# Patient Record
Sex: Female | Born: 1985
Health system: Southern US, Community
[De-identification: ages and names within clinical notes are randomized; demographics above are authoritative.]

## PROBLEM LIST (undated history)

## (undated) DIAGNOSIS — M069 Rheumatoid arthritis, unspecified: Secondary | ICD-10-CM

## (undated) DIAGNOSIS — M21611 Bunion of right foot: Secondary | ICD-10-CM

## (undated) DIAGNOSIS — A749 Chlamydial infection, unspecified: Secondary | ICD-10-CM

## (undated) DIAGNOSIS — I7300 Raynaud's syndrome without gangrene: Secondary | ICD-10-CM

## (undated) DIAGNOSIS — Z803 Family history of malignant neoplasm of breast: Secondary | ICD-10-CM

## (undated) DIAGNOSIS — Z973 Presence of spectacles and contact lenses: Secondary | ICD-10-CM

## (undated) DIAGNOSIS — L309 Dermatitis, unspecified: Secondary | ICD-10-CM

## (undated) DIAGNOSIS — D279 Benign neoplasm of unspecified ovary: Secondary | ICD-10-CM

## (undated) DIAGNOSIS — I10 Essential (primary) hypertension: Secondary | ICD-10-CM

## (undated) DIAGNOSIS — E559 Vitamin D deficiency, unspecified: Secondary | ICD-10-CM

## (undated) HISTORY — PX: LAPAROSCOPY: SHX197

## (undated) HISTORY — DX: Chlamydial infection, unspecified: A74.9

## (undated) HISTORY — DX: Rheumatoid arthritis, unspecified: M06.9

## (undated) HISTORY — DX: Family history of malignant neoplasm of breast: Z80.3

---

## 2005-09-30 ENCOUNTER — Emergency Department: Payer: Self-pay | Admitting: Emergency Medicine

## 2006-09-28 DIAGNOSIS — Z8614 Personal history of Methicillin resistant Staphylococcus aureus infection: Secondary | ICD-10-CM

## 2006-09-28 HISTORY — DX: Personal history of Methicillin resistant Staphylococcus aureus infection: Z86.14

## 2006-12-07 ENCOUNTER — Emergency Department: Payer: Self-pay | Admitting: Emergency Medicine

## 2006-12-08 ENCOUNTER — Emergency Department: Payer: Self-pay | Admitting: Emergency Medicine

## 2007-10-21 ENCOUNTER — Ambulatory Visit: Payer: Self-pay | Admitting: Internal Medicine

## 2011-09-17 ENCOUNTER — Inpatient Hospital Stay: Payer: Self-pay | Admitting: Obstetrics and Gynecology

## 2011-09-23 LAB — PATHOLOGY REPORT

## 2011-09-29 DIAGNOSIS — D279 Benign neoplasm of unspecified ovary: Secondary | ICD-10-CM

## 2011-09-29 HISTORY — DX: Benign neoplasm of unspecified ovary: D27.9

## 2011-09-29 HISTORY — PX: LAPAROSCOPIC SALPINGOOPHERECTOMY: SUR795

## 2012-08-28 HISTORY — PX: OVARIAN CYST REMOVAL: SHX89

## 2015-01-20 NOTE — Op Note (Signed)
PATIENT NAME:  Meghan Arnold, Meghan Arnold MR#:  [id number] DATE OF BIRTH:  1986/09/27  DATE OF PROCEDURE:  09/17/2011  PREOPERATIVE DIAGNOSIS: Large left ovarian cyst, suspect dermoid.   POSTOPERATIVE DIAGNOSIS: Large left ovarian cyst, suspect dermoid.  PROCEDURES:  1. Diagnostic laparoscopy.  2. Conversion to open laparotomy.  3. Lysis of adhesions.  4. Left salpingo-oophorectomy.  5. Removal of left ovarian cyst.   SURGEON: Prentice Docker, MD   ASSISTANT: Verlene Mayer, MD     ANESTHESIA: General.  ESTIMATED BLOOD LOSS: 100 mL.   OPERATIVE FLUIDS: 1300 mL crystalloid.   COMPLICATIONS: None.  URINE OUTPUT: 200 mL clear urine at the end of the procedure.   FINDINGS:  1. Large left ovarian cyst (approximately 16 cm) with contents suggestive of dermoid.  2. Adhesions of left ovarian cyst to the left pelvic sidewall, left fallopian tube, posterior uterus, right ovary and fallopian tube, and densely adherent to posterior vaginal wall/cervix.   CONDITION: AT THE END OF PROCEDURE: Stable.   INDICATIONS: Meghan Arnold is a 29 year old female who presented to my office for routine annual exam, and on examination it was noticed that she had an enlarged pelvic mass. An ultrasound was obtained, and the findings were suggestive of a left ovarian cyst with features consistent with a mature cystic teratoma. After discussion with the patient, she was taken to the OR for an attempted laparoscopic removal with the understanding that there is a possibility that it might need to be converted to an open procedure given the size of the cyst.   PROCEDURE IN DETAIL: The patient was met in the preoperative area, and consents were reviewed and signed. She was taken to the operating room where she was placed under general anesthesia, which was found to be adequate. She was placed in the dorsal supine lithotomy position in the dolphin stirrups and prepped and draped in the usual sterile fashion. A Foley catheter  was placed at the beginning of the procedure after a timeout was called. Next, a speculum was placed in the vagina, and the anterior lip of the cervix was grasped with a single-tooth tenaculum, and an acorn uterine manipulator was affixed to the single-tooth tenaculum. The speculum was then removed.   Attention was then turned to the abdomen where abdominal entry was gained via an open entry technique, and a Hassan trocar was placed without difficulty in an infraumbilical location; and two 2-0 Vicryl stay sutures were thrown on the lateral aspects of the fascia to maintain the [facility] trocar in place. Abdominal entry was again verified with opening pressure being less than 10. Inspection of the abdomen was undertaken at this point with the above-noted findings. A 5 mm left lower quadrant and 10 mm right lower quadrant port were both placed under direct, intra-abdominal video visualization after injection of local anesthetic. Exploration of the mass revealed that it was densely adherent to the left sidewall. This was, at this point, all that was able to be visualized. The 5 mm LigaSure was used to interrupt the infundibulopelvic ligament on the left and follow the attachment of the mass to the left sidewall around, taking care not to come in contact with the left pelvic sidewall and hugging the mass. Of note, the ureter was identified prior to this portion of the procedure and was found to be well away from the field of operation. As the mass was freed up more from the left side, visualization was gained anteriorly where it was noted that the uterus was adherent  to the mass. Posteriorly, we were able to inspect and again we saw adhesions of the mass apparently to the posterior uterus inferiorly as well as the right ovary and right fallopian tube. Given the adhesions and delicate structures involved at this point, the procedure was terminated from a laparoscopic approach and then converted to a laparotomy. Prior to  performing the laparotomy, the trocars were removed and the fascia was closed using #2 Vicryl in a running fashion.   A Pfannenstiel incision was made to gain entrance into the abdomen, and it was carried down through the various layers, and the abdominal cavity was entered without difficulty. To gain adequate space, the rectus muscles were divided for a short length medially approximately 1 cm on each side. The cyst at this point was so massive that it would not fit through a regular Pfannenstiel size incision. The decision had to be made whether to make a very large extension to the incision or, given the sonographic findings, to attempt drainage of the mass so that space could be gained to finish the lysing of the adhesions of the mass to the uterus and the rest of the pelvic structures. At this point, we decided to carefully drain the mass. A 60 mL syringe with a large-bore needle was introduced and there was return of a yellowish fluid; and also noted were multiple pieces of hair.  Given the fact that this was very consistent with a mature cystic teratoma, a larger hole was made in the surface to be able to more effectively drain as much as possible from the cyst; therefore, two Allis clamps were placed approximately 2.5 cm apart and the scalpel used to make an incision in the cyst after placement of several moist blue towels that were packed around the mass to minimize any spillage of the mass contents into the abdomen. The suction cannula was then placed into the cyst, and the cyst was drained of approximately 250 to 300 mL of yellow/likely sebaceous material. At this point, the incision into the mass was closed using a running locked stitch of #0 Vicryl. With this, visualization of the posterior uterus and other pelvic structures was vastly improved; and both blunt and sharp dissection was undertaken to free the mass from the rest of the pelvic structures. As of note, the mass was densely adherent to the  inferior posterior uterus and needed to be dissected using electrocautery. The right ovary and right fallopian tube were also densely adherent to the cyst and were dissected very carefully using Metzenbaum scissors with care to attempt not to injure the fallopian tube. Once the mass was liberated, it was removed from the abdomen. The abdomen was then irrigated copiously using approximately 4 liters of crystalloid. At this point, all vascular pedicles and sites of adhesions were inspected and found to be hemostatic. There was a small cyst noted on her right ovary. Given the findings on ultrasound of potentially a simple cyst, and the fact that this was her only remaining ovary at this point, the cyst was not drained nor removed. At this point all instrumentation was removed from the abdomen, including laparotomy sponges and retractors; and the fascia was closed with Maxon without difficulty. The skin was of the Pfannenstiel incision was closed using a 4-0 Vicryl undyed in subcuticular fashion. The three laparoscopic port sites were then covered using Dermabond.   Attention was turned to the perineal area once again where a speculum was placed in the vagina, and the [facility]  as well as the single-tooth tenaculum were removed; and hemostasis at the cervix was noted, and the speculum was removed. The indwelling catheter was left in place.    The patient tolerated the procedure well. Sponge, lap, and needle counts were correct x2.  The patient was awakened in the OR and taken to the recovery area in stable condition.  ____________________________ Will Bonnet, MD sdj:cbb D: 09/17/2011 16:55:25 ET T: 09/17/2011 17:38:59 ET JOB#: [id number]  cc: Will Bonnet, MD, <Dictator> Will Bonnet MD ELECTRONICALLY SIGNED 10/13/2011 8:55

## 2015-09-29 DIAGNOSIS — M069 Rheumatoid arthritis, unspecified: Secondary | ICD-10-CM

## 2015-09-29 HISTORY — DX: Rheumatoid arthritis, unspecified: M06.9

## 2016-08-11 ENCOUNTER — Emergency Department: Payer: Self-pay

## 2016-08-11 ENCOUNTER — Emergency Department
Admission: EM | Admit: 2016-08-11 | Discharge: 2016-08-11 | Disposition: A | Discharge status: Home / Self Care | Payer: Self-pay | Attending: Emergency Medicine | Admitting: Emergency Medicine

## 2016-08-11 ENCOUNTER — Encounter: Payer: Self-pay | Admitting: *Deleted

## 2016-08-11 DIAGNOSIS — T07XXXA Unspecified multiple injuries, initial encounter: Secondary | ICD-10-CM

## 2016-08-11 DIAGNOSIS — M79642 Pain in left hand: Secondary | ICD-10-CM

## 2016-08-11 DIAGNOSIS — Z23 Encounter for immunization: Secondary | ICD-10-CM | POA: Insufficient documentation

## 2016-08-11 DIAGNOSIS — M79641 Pain in right hand: Secondary | ICD-10-CM

## 2016-08-11 DIAGNOSIS — S60312A Abrasion of left thumb, initial encounter: Secondary | ICD-10-CM | POA: Insufficient documentation

## 2016-08-11 DIAGNOSIS — Y939 Activity, unspecified: Secondary | ICD-10-CM | POA: Insufficient documentation

## 2016-08-11 DIAGNOSIS — Y999 Unspecified external cause status: Secondary | ICD-10-CM | POA: Insufficient documentation

## 2016-08-11 DIAGNOSIS — Y92410 Unspecified street and highway as the place of occurrence of the external cause: Secondary | ICD-10-CM | POA: Insufficient documentation

## 2016-08-11 DIAGNOSIS — S60311A Abrasion of right thumb, initial encounter: Secondary | ICD-10-CM | POA: Insufficient documentation

## 2016-08-11 LAB — POCT PREGNANCY, URINE: Preg Test, Ur: NEGATIVE

## 2016-08-11 MED ORDER — TETANUS-DIPHTH-ACELL PERTUSSIS 5-2.5-18.5 LF-MCG/0.5 IM SUSP
0.5000 mL | Freq: Once | INTRAMUSCULAR | Status: AC
  Administered 2016-08-11: 0.5 mL via INTRAMUSCULAR
  Filled 2016-08-11: qty 0.5

## 2016-08-11 MED ORDER — IBUPROFEN 600 MG PO TABS: 600.0000 mg | ORAL_TABLET | Freq: Three times a day (TID) | ORAL | 0 refills | Status: DC | PRN
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
## 2016-08-11 NOTE — Discharge Instructions (Signed)
Ice and elevate hands as needed for swelling or pain today. Begin using moisturizing hand lotion or a very thin layer of Neosporin to your abrasions. Ibuprofen 600 mg 3 times a day with food. Follow-up with your primary care doctor or [facility] clinic if any continued problems.

## 2016-08-22 ENCOUNTER — Encounter: Payer: Self-pay | Admitting: Emergency Medicine

## 2016-08-22 ENCOUNTER — Emergency Department
Admission: EM | Admit: 2016-08-22 | Discharge: 2016-08-22 | Disposition: A | Discharge status: Home / Self Care | Payer: No Typology Code available for payment source | Attending: Student in an Organized Health Care Education/Training Program | Admitting: Student in an Organized Health Care Education/Training Program

## 2016-08-22 DIAGNOSIS — F17210 Nicotine dependence, cigarettes, uncomplicated: Secondary | ICD-10-CM | POA: Insufficient documentation

## 2016-08-22 DIAGNOSIS — Z791 Long term (current) use of non-steroidal anti-inflammatories (NSAID): Secondary | ICD-10-CM | POA: Insufficient documentation

## 2016-08-22 DIAGNOSIS — M7989 Other specified soft tissue disorders: Secondary | ICD-10-CM | POA: Diagnosis present

## 2016-08-22 DIAGNOSIS — A46 Erysipelas: Secondary | ICD-10-CM | POA: Insufficient documentation

## 2016-08-22 DIAGNOSIS — L089 Local infection of the skin and subcutaneous tissue, unspecified: Secondary | ICD-10-CM

## 2016-08-22 DIAGNOSIS — B9689 Other specified bacterial agents as the cause of diseases classified elsewhere: Secondary | ICD-10-CM

## 2016-08-22 MED ORDER — TRAMADOL HCL 50 MG PO TABS: 50.0000 mg | ORAL_TABLET | Freq: Four times a day (QID) | ORAL | 0 refills | Status: AC | PRN

## 2016-08-22 MED ORDER — SULFAMETHOXAZOLE-TRIMETHOPRIM 800-160 MG PO TABS: 1.0000 | ORAL_TABLET | Freq: Two times a day (BID) | ORAL | 0 refills | Status: DC

## 2016-08-22 MED ORDER — CEPHALEXIN 500 MG PO CAPS: 500.0000 mg | ORAL_CAPSULE | Freq: Four times a day (QID) | ORAL | 0 refills | Status: DC
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
## 2016-11-17 DIAGNOSIS — R700 Elevated erythrocyte sedimentation rate: Secondary | ICD-10-CM | POA: Insufficient documentation

## 2016-11-17 DIAGNOSIS — I7300 Raynaud's syndrome without gangrene: Secondary | ICD-10-CM | POA: Insufficient documentation

## 2016-11-17 DIAGNOSIS — R7982 Elevated C-reactive protein (CRP): Secondary | ICD-10-CM | POA: Insufficient documentation

## 2016-11-17 DIAGNOSIS — M059 Rheumatoid arthritis with rheumatoid factor, unspecified: Secondary | ICD-10-CM | POA: Insufficient documentation

## 2016-11-17 DIAGNOSIS — M2550 Pain in unspecified joint: Secondary | ICD-10-CM | POA: Insufficient documentation

## 2016-12-10 ENCOUNTER — Other Ambulatory Visit: Payer: Self-pay | Admitting: Orthopedic Surgery

## 2016-12-10 DIAGNOSIS — S8392XD Sprain of unspecified site of left knee, subsequent encounter: Secondary | ICD-10-CM

## 2016-12-10 DIAGNOSIS — M2352 Chronic instability of knee, left knee: Secondary | ICD-10-CM

## 2016-12-10 DIAGNOSIS — M2392 Unspecified internal derangement of left knee: Secondary | ICD-10-CM

## 2017-01-02 ENCOUNTER — Ambulatory Visit
Admission: RE | Admit: 2017-01-02 | Discharge: 2017-01-02 | Disposition: A | Discharge status: Home / Self Care | Payer: BLUE CROSS/BLUE SHIELD | Source: Ambulatory Visit | Attending: Orthopedic Surgery | Admitting: Orthopedic Surgery

## 2017-01-02 DIAGNOSIS — M25462 Effusion, left knee: Secondary | ICD-10-CM | POA: Insufficient documentation

## 2017-01-02 DIAGNOSIS — M7122 Synovial cyst of popliteal space [Baker], left knee: Secondary | ICD-10-CM | POA: Diagnosis not present

## 2017-01-02 DIAGNOSIS — X58XXXD Exposure to other specified factors, subsequent encounter: Secondary | ICD-10-CM | POA: Diagnosis not present

## 2017-01-02 DIAGNOSIS — M2352 Chronic instability of knee, left knee: Secondary | ICD-10-CM | POA: Diagnosis present

## 2017-01-02 DIAGNOSIS — S8392XD Sprain of unspecified site of left knee, subsequent encounter: Secondary | ICD-10-CM

## 2017-01-02 DIAGNOSIS — M659 Synovitis and tenosynovitis, unspecified: Secondary | ICD-10-CM | POA: Diagnosis not present

## 2017-01-02 DIAGNOSIS — M2392 Unspecified internal derangement of left knee: Secondary | ICD-10-CM | POA: Diagnosis present

## 2017-04-19 ENCOUNTER — Ambulatory Visit (INDEPENDENT_AMBULATORY_CARE_PROVIDER_SITE_OTHER): Payer: BLUE CROSS/BLUE SHIELD | Admitting: Obstetrics and Gynecology

## 2017-04-19 ENCOUNTER — Encounter: Payer: Self-pay | Admitting: Obstetrics and Gynecology

## 2017-04-19 VITALS — BP 114/74 | Ht 65.0 in | Wt 160.0 lb

## 2017-04-19 DIAGNOSIS — Z124 Encounter for screening for malignant neoplasm of cervix: Secondary | ICD-10-CM

## 2017-04-19 DIAGNOSIS — B373 Candidiasis of vulva and vagina: Secondary | ICD-10-CM | POA: Diagnosis not present

## 2017-04-19 DIAGNOSIS — Z1389 Encounter for screening for other disorder: Secondary | ICD-10-CM | POA: Diagnosis not present

## 2017-04-19 DIAGNOSIS — Z01419 Encounter for gynecological examination (general) (routine) without abnormal findings: Secondary | ICD-10-CM | POA: Diagnosis not present

## 2017-04-19 DIAGNOSIS — Z1331 Encounter for screening for depression: Secondary | ICD-10-CM

## 2017-04-19 DIAGNOSIS — Z113 Encounter for screening for infections with a predominantly sexual mode of transmission: Secondary | ICD-10-CM | POA: Diagnosis not present

## 2017-04-19 DIAGNOSIS — Z1339 Encounter for screening examination for other mental health and behavioral disorders: Secondary | ICD-10-CM

## 2017-04-19 DIAGNOSIS — B3731 Acute candidiasis of vulva and vagina: Secondary | ICD-10-CM

## 2017-04-19 LAB — POCT WET PREP WITH KOH
KOH Prep POC: POSITIVE — AB
PH, VAGINAL: 4.5
Trichomonas, UA: NEGATIVE

## 2017-04-19 MED ORDER — FLUCONAZOLE 150 MG PO TABS: 150.0000 mg | ORAL_TABLET | Freq: Once | ORAL | 0 refills | Status: AC

## 2017-04-19 NOTE — Progress Notes (Signed)
Gynecology Annual Exam  PCP: Patient, No Pcp Per  Chief Complaint  Patient presents with  . Annual Exam    History of Present Illness:  Meghan Arnold is a 31 y.o. G0P0000 who LMP was Patient's last menstrual period was 04/05/2017., presents today for her annual examination.  Her menses are regular every 28-30 days, lasting 5 day(s).  Dysmenorrhea none. She does not have intermenstrual bleeding.  She is single partner, contraception - none.  Last Pap: 6 years, normal Hx of STDs: chlamydia, distant- treated  Last mammogram: n/a There is no FH of breast cancer. There is no FH of ovarian cancer. The patient does not do self-breast exams.  Tobacco use: The patient denies current or previous tobacco use. Alcohol use: social drinker Exercise: moderately active  The patient wears seatbelts: yes.      Past Medical History:  Diagnosis Date  . Chlamydia   . Rheumatoid arthritis [facility])     Past Surgical History:  Procedure Laterality Date  . LAPAROSCOPIC SALPINGOOPHERECTOMY    . LAPAROSCOPY    . OVARIAN CYST REMOVAL      Prior to Admission medications   Medication Sig Start Date End Date Taking? Authorizing Provider  folic acid (FOLVITE) 1 MG tablet Take 1 mg by mouth daily.   Yes [provider]  methotrexate (RHEUMATREX) 2.5 MG tablet Take 2.5 mg by mouth once a week. Caution:Chemotherapy. Protect from light.   Yes [provider]   Allergie: No Known Allergies  Gynecologic History: Patient's last menstrual period was 04/05/2017.  Obstetric History: G0P0000  Social History   Social History  . Marital status: Single    Spouse name: N/A  . Number of children: N/A  . Years of education: N/A   Occupational History  . Not on file.   Social History Main Topics  . Smoking status: Former Smoker    Packs/day: 0.10    Types: Cigarettes  . Smokeless tobacco: Never Used  . Alcohol use Yes  . Drug use: No  . Sexual activity: Yes    Birth control/  protection: None   Other Topics Concern  . Not on file   Social History Narrative  . No narrative on file    Family History  Problem Relation Age of Onset  . Colon cancer Maternal Grandfather     Review of Systems  Constitutional: Negative.   HENT: Negative.   Eyes: Negative.   Respiratory: Negative.   Cardiovascular: Negative.   Gastrointestinal: Negative.   Genitourinary: Negative.   Musculoskeletal: Negative.   Skin: Negative.   Neurological: Negative.   Psychiatric/Behavioral: Negative.      Physical Exam BP 114/74   Ht 5\' 5"  (1.651 m)   Wt 160 lb (72.6 kg)   LMP 04/05/2017   BMI 26.63 kg/m    Physical Exam  Constitutional: She is oriented to person, place, and time. She appears well-developed and well-nourished. No distress.  Eyes: EOM are normal. No scleral icterus.  Neck: Normal range of motion. Neck supple.  Cardiovascular: Normal rate and regular rhythm.   Pulmonary/Chest: Effort normal and breath sounds normal. No respiratory distress. She has no wheezes. She has no rales.  Abdominal: Soft. Bowel sounds are normal. She exhibits no distension and no mass. There is no tenderness. There is no rebound and no guarding.  Musculoskeletal: Normal range of motion. She exhibits no edema.  Neurological: She is alert and oriented to person, place, and time. No cranial nerve deficit.  Skin: Skin is  warm and dry. No erythema.  Psychiatric: She has a normal mood and affect. Her behavior is normal. Judgment normal.   Female chaperone present for pelvic and breast  portions of the physical exam  Results: AUDIT Questionnaire (screen for alcoholism): 3 PHQ-9: 1  Wet Prep: Ph: 4.5 Clue cells: present Trichomonas: absent Fungal elements: present   Assessment: 31 y.o. G0P0000 female here for routine annual gynecologic examination.  Plan: Problem List Items Addressed This Visit    None    Visit Diagnoses    Women's annual routine gynecological examination    -   Primary   Relevant Orders   IGP,CtNg,AptimaHPV,rfx16/18,45   Screening for depression       Screening for alcohol problem       Pap smear for cervical cancer screening       Relevant Orders   IGP,CtNg,AptimaHPV,rfx16/18,45   Screen for STD (sexually transmitted disease)       Relevant Orders   IGP,CtNg,AptimaHPV,rfx16/18,45   Vulvovaginal candidiasis       Relevant Medications   fluconazole (DIFLUCAN) 150 MG tablet      Screening: -- Blood pressure screen normal -- Colonoscopy - not due -- Mammogram - not due -- Weight screening: overweight: continue to monitor -- Depression screening negative (PHQ-9) -- Nutrition: normal -- cholesterol screening: not due for screening -- osteoporosis screening: not due -- tobacco screening: not using -- alcohol screening: AUDIT questionnaire indicates low-risk usage. -- family history of breast cancer screening: done. not at high risk. -- no evidence of domestic violence or intimate partner violence. -- STD screening: gonorrhea/chlamydia NAAT collected -- pap smear collected per ASCCP guidelines - - HPV vaccination series: not eligilbe  Prepregnancy planning:  Unable to get pregnant x 6 years of "not preventing." taking methotrexate since January for rheumatoid arthritis.  Discussed stopping MTX and choosing alternative.  Likely need for HSG given history.    Prentice Docker, MD 04/19/2017 10:47 AM

## 2017-04-19 NOTE — Patient Instructions (Signed)
Hysterosalpingogram (HSG) What is a hysterosalpingogram (HSG)?  A hysterosalpingogram or HSG is an x-ray procedure used to see whether the fallopian tubes are patent (open) and if the inside of the uterus (uterine cavity) is normal. HSG is an outpatient procedure that usually takes less than 5 minutes to perform. It is usually done after the menstrual period ends but before ovulation. How is a hysterosalpingogram done?  A woman is positioned under a fluoroscope (a x-ray imager that can take pictures during the study) on a table. The gynecologist or radiologist then examines the patient's uterus and places a speculum in her vagina. Her cervix is cleaned, and a device (cannula) is placed into the opening of the cervix. The doctor gently fills the uterus with a liquid containing iodine (a fluid that can be seen by x-ray) through the cannula. The contrast will be seen as white on the image and can show the contour of the uterus as the liquid travels from the cannula, into the uterus, and through the fallopian tubes. As the contrast enters the tubes, it outlines the length of the tubes and spills out their ends if they are open. Abnormalities inside the uterine cavity may also be detected by the doctor observing the x-ray images when the fluid movement is disrupted by the abnormality. The HSG procedure is not designed to evaluate the ovaries or to diagnose endometriosis, nor can it identify fibroids that are outside of the endometrial cavity, either in the muscular part of the uterus, or on the outside of the uterus. Often, side views of the uterus and tubes are obtained by having the woman change her position on the table. After the HSG, a woman can immediately return to normal activities, although some doctors ask that she refrain from intercourse for a few days.  Is it uncomfortable?  An HSG usually causes mild or moderate uterine cramping for about 5-10 minutes. However, some women may experience cramps for  several hours. These symptoms can be greatly reduced by taking medications used for menstrual cramps before the procedure or when they occur. Women should be prepared to have a family member or friend drive them home after the procedure in the event that they are experiencing cramping.  Does a hysterosalpingogram enhance fertility?  It is controversial whether this procedure enhances fertility. Some studies show a slight increase in fertility lasting about 3 months after a normal HSG. However, most doctors perform HSG only for diagnostic reasons. What are the risks and complications of HSG?  HSG is considered a very safe procedure. However, there is a set of recognized complications, some serious, which occur less than 1% of the time. . Infection - The most common serious problem with HSG is pelvic infection. This usually occurs when a woman has had previous tubal disease (such as a past infection of chlamydia). In rare cases, infection can damage the fallopian tubes or make it necessary to remove them. A woman should call her doctor if she experiences increasing pain or a fever within 1-2 days of the HSG. . Fainting - Rarely, the woman may get light-headed during or shortly after the procedure. . Radiation Exposure - Radiation exposure from an HSG is very low, less than with a kidney or bowel study. This exposure has not been shown to cause harm, even if a woman conceives later the same month. The HSG should not be done if pregnancy is suspected. . Iodine Allergy - Rarely, a woman may have an allergy to the iodine  contrast used in HSG. A woman should inform her doctor if she is allergic to iodine, intravenous contrast dyes, or seafood. Women who are allergic to iodine should have the HSG procedure performed without an iodine-containing contrast solution. If a woman experiences a rash, itching, or swelling after the procedure, she should contact her doctor. Marland Kitchen Spotting - Spotting sometimes occurs for 1-2  days after HSG. Unless instructed otherwise, a woman should notify her doctor if she experiences heavy bleeding after HSG.  What is the next step if my tubes are blocked?  If your tubes are blocked, your doctor will likely recommend either a surgical procedure to directly view the tubes (laparoscopy) or to bypass the tubes and perform in vitro fertilization (IVF). This is a complex decision that should be discussed with your doctor. For more information, please see the ASRM booklet Laparoscopy and hysteroscopy and fact sheet What do I need to know about conceiving after tubal surgery? Are there other options to evaluate tubal patency?  Laparoscopy can also determine if tubes are open, using a procedure called chromopertubation. An alternative procedure to evaluate tubal patency is a sonohysterosalpingogram (SHG). For [facility], a catheter (narrow tube) is placed in the uterus through the vagina and saline and air are injected. In women who have open fallopian tubes, tiny air bubbles may be seen going through the fallopian tubes during the ultrasound. However, this procedure is inferior to HSG for assessment of tubal patency. Revised 2015 For more information on this and other reproductive health topics, visit [email or web address] Patent tube, contrast  spills out Blocked tube, no contrast spills out Ovary Fallopian tube Uterus Vagina Catheter used to inject the contrast [facility] . [place], [place] [address] TEL [phone] . FAX [phone] . E-MAIL [email or web address] . URL [email or web address]

## 2017-04-22 ENCOUNTER — Encounter: Payer: Self-pay | Admitting: Obstetrics and Gynecology

## 2017-04-22 LAB — IGP,CTNG,APTIMAHPV,RFX16/18,45
Chlamydia, Nuc. Acid Amp: NEGATIVE
Gonococcus by Nucleic Acid Amp: NEGATIVE
HPV Aptima: NEGATIVE
PAP Smear Comment: 0

## 2017-04-28 ENCOUNTER — Encounter: Payer: Self-pay | Admitting: Obstetrics and Gynecology

## 2017-05-03 ENCOUNTER — Telehealth: Payer: Self-pay | Admitting: Obstetrics and Gynecology

## 2017-05-03 NOTE — Telephone Encounter (Signed)
Please schedule HSG for day 6-10 of her cycle. I do not know the first day of her LMP(must've just occurred). Perhaps you could give her a call to check when that would be.  I don't know whether I have to place an order for that or not.  Thanks!

## 2017-05-03 NOTE — Telephone Encounter (Signed)
Appointment Request From: [name]    With Provider: Prentice Docker, MD [facility]]    Preferred Date Range: From 05/02/2017 To 05/07/2017    Preferred Times: Any    Reason for visit: Office Visit    Comments:  Hello, Dr. Glennon Mac asked me to schedule an appointment as soon as my cycle started so that he could get me in for a HSG test. My cycle started today 8/5.  Thanks    [employer] to. Dr. Glennon Mac for orders.

## 2017-05-04 ENCOUNTER — Other Ambulatory Visit: Payer: Self-pay | Admitting: Obstetrics and Gynecology

## 2017-05-04 DIAGNOSIS — Z3141 Encounter for fertility testing: Secondary | ICD-10-CM

## 2017-05-04 NOTE — Telephone Encounter (Signed)
Per patient, LMP 05/02/17. Patient is scheduled for Tuesday, 05/11/17 @ 1:30pm. Patient is aware to arrive at 1pm at the [facility] registration desk, and that there are no special instructions.

## 2017-05-11 ENCOUNTER — Ambulatory Visit
Admission: RE | Admit: 2017-05-11 | Discharge: 2017-05-11 | Disposition: A | Discharge status: Home / Self Care | Payer: BLUE CROSS/BLUE SHIELD | Source: Ambulatory Visit | Attending: Obstetrics and Gynecology | Admitting: Obstetrics and Gynecology

## 2017-05-11 DIAGNOSIS — Z3141 Encounter for fertility testing: Secondary | ICD-10-CM | POA: Insufficient documentation

## 2017-05-11 MED ORDER — IOPAMIDOL (ISOVUE-370) INJECTION 76%
50.0000 mL | Freq: Once | INTRAVENOUS | Status: AC | PRN
  Administered 2017-05-11: 15 mL

## 2017-05-11 NOTE — Procedures (Signed)
Prep and drape done.  Cervix normal.  Catheter placed into uterine cavity. Approximately 9 mL dye injected under fluoroscopic visualization.  Patent right fallopian tube seen with slow filling and some spillage, possibly consistent with underlying scarring.  Catheter removed.  Pt stable, tolerated procedure well.   Isovue 370 contrast used.  Prentice Docker, MD 05/11/2017 3:08 PM

## 2017-05-27 ENCOUNTER — Encounter: Payer: Self-pay | Admitting: Obstetrics and Gynecology

## 2017-06-30 ENCOUNTER — Encounter: Payer: Self-pay | Admitting: Obstetrics and Gynecology

## 2017-06-30 ENCOUNTER — Ambulatory Visit (INDEPENDENT_AMBULATORY_CARE_PROVIDER_SITE_OTHER): Payer: BLUE CROSS/BLUE SHIELD | Admitting: Obstetrics and Gynecology

## 2017-06-30 VITALS — BP 118/74 | Wt 154.0 lb

## 2017-06-30 DIAGNOSIS — Z3169 Encounter for other general counseling and advice on procreation: Secondary | ICD-10-CM | POA: Diagnosis not present

## 2017-07-02 DIAGNOSIS — Z3169 Encounter for other general counseling and advice on procreation: Secondary | ICD-10-CM | POA: Insufficient documentation

## 2017-07-02 NOTE — Progress Notes (Signed)
Obstetrics & Gynecology Office Visit   Chief Complaint  Patient presents with  . Follow-up  Follow up results of hysterosalpingogram  History of Present Illness: 31 y.o. G0P0000 female who presents in follow up to discuss next steps of her HSG.  Briefly, this is a patient who had an incidentally noted pelvic mass noted during a routine gynecologic exam in 2012.  The mass was a large teratoma, removed surgically.  During her surgery her left fallopian tube and entire left ovary were removed.  A significant amount of adhesive disease was present at this time.  The cyst was a large and required laparotomy to be removed due to inflammation and resulting adhesions.  She had and her partner have been trying for about six years without success, to get pregnant.  An HSG was performed to assess function of her right fallopian tube.  The findings showed slow filling of the uterus and right fallopian tube.  Peritoneal spillage seen although predominantly localized adjacent to the fimbriated end of the fallopian tube, possible suggesting some underlying adhesive disease.  No left fallopian tube filling seen.   She presents today to discuss further options for achieving pregnancy.  She has previously been counseled regarding the use of methotrexate and its effects on fertility.    Past Medical History:  Diagnosis Date  . Chlamydia   . Family history of breast cancer   . Rheumatoid arthritis [facility])     Past Surgical History:  Procedure Laterality Date  . LAPAROSCOPIC SALPINGOOPHERECTOMY    . LAPAROSCOPY    . OVARIAN CYST REMOVAL      Gynecologic History: Patient's last menstrual period was 06/23/2017.  Obstetric History: G0P0000  Family History  Problem Relation Age of Onset  . Colon cancer Maternal Grandfather   . Breast cancer Maternal Grandmother 72  . Breast cancer Other 64  . Breast cancer Cousin 98    Social History   Social History  . Marital status: Single    Spouse name: N/A  .  Number of children: N/A  . Years of education: N/A   Occupational History  . Not on file.   Social History Main Topics  . Smoking status: Former Smoker    Packs/day: 0.10    Types: Cigarettes  . Smokeless tobacco: Never Used  . Alcohol use Yes  . Drug use: No  . Sexual activity: Yes    Birth control/ protection: None   Other Topics Concern  . Not on file   Social History Narrative  . No narrative on file   Allergies: No Known Allergies  Prior to Admission medications   Medication Sig Start Date End Date Taking? Authorizing Provider  folic acid (FOLVITE) 1 MG tablet Take 1 mg by mouth daily.    [provider]  methotrexate (RHEUMATREX) 2.5 MG tablet Take 2.5 mg by mouth once a week. Caution:Chemotherapy. Protect from light.    [provider]    Review of Systems  Constitutional: Negative.   HENT: Negative.   Eyes: Negative.   Respiratory: Negative.   Cardiovascular: Negative.   Gastrointestinal: Negative.   Genitourinary: Negative.   Musculoskeletal: Negative.   Skin: Negative.   Neurological: Negative.   Psychiatric/Behavioral: Negative.      Physical Exam BP 118/74   Wt 154 lb (69.9 kg)   LMP 06/23/2017   BMI 25.63 kg/m  Patient's last menstrual period was 06/23/2017. Physical Exam  Constitutional: She is oriented to person, place, and time. She appears well-developed and well-nourished. No  distress.  HENT:  Head: Normocephalic and atraumatic.  Neurological: She is alert and oriented to person, place, and time.  Psychiatric: She has a normal mood and affect. Her behavior is normal. Judgment normal.    Assessment: 31 y.o. G0P0000 female here for infertility counseling  Plan: Problem List Items Addressed This Visit    Infertility counseling - Primary   Relevant Orders   Ambulatory referral to Endocrinology    Referral to reproductive endocrinology and infertility.  Discontinue methotrexate in coordination with her rheumatologist.     32 minutes spent in face to face discussion with > 50% spent in counseling and management of her infertility, likely a result of significant pelvic adhesive disease.   Prentice Docker, MD 07/02/2017 3:30 PM
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
## 2017-11-22 ENCOUNTER — Ambulatory Visit (INDEPENDENT_AMBULATORY_CARE_PROVIDER_SITE_OTHER): Payer: Self-pay

## 2017-11-22 ENCOUNTER — Encounter: Payer: Self-pay | Admitting: Rheumatology

## 2017-11-22 ENCOUNTER — Ambulatory Visit: Payer: BLUE CROSS/BLUE SHIELD | Admitting: Rheumatology

## 2017-11-22 VITALS — BP 127/86 | HR 84 | Resp 17 | Ht 65.0 in | Wt 145.0 lb

## 2017-11-22 DIAGNOSIS — I7300 Raynaud's syndrome without gangrene: Secondary | ICD-10-CM

## 2017-11-22 DIAGNOSIS — M79671 Pain in right foot: Secondary | ICD-10-CM | POA: Diagnosis not present

## 2017-11-22 DIAGNOSIS — M79641 Pain in right hand: Secondary | ICD-10-CM | POA: Diagnosis not present

## 2017-11-22 DIAGNOSIS — R5383 Other fatigue: Secondary | ICD-10-CM | POA: Diagnosis not present

## 2017-11-22 DIAGNOSIS — M25562 Pain in left knee: Secondary | ICD-10-CM | POA: Diagnosis not present

## 2017-11-22 DIAGNOSIS — M25561 Pain in right knee: Secondary | ICD-10-CM

## 2017-11-22 DIAGNOSIS — Z79899 Other long term (current) drug therapy: Secondary | ICD-10-CM

## 2017-11-22 DIAGNOSIS — G8929 Other chronic pain: Secondary | ICD-10-CM

## 2017-11-22 DIAGNOSIS — M79642 Pain in left hand: Secondary | ICD-10-CM

## 2017-11-22 DIAGNOSIS — M79672 Pain in left foot: Secondary | ICD-10-CM | POA: Diagnosis not present

## 2017-11-22 DIAGNOSIS — M059 Rheumatoid arthritis with rheumatoid factor, unspecified: Secondary | ICD-10-CM | POA: Diagnosis not present

## 2017-11-22 NOTE — Patient Instructions (Addendum)
Certolizumab pegol injection What is this medicine? CERTOLIZUMAB (SER toe LIZ oo mab) is used to treat Crohn's disease, psoriatic arthritis, or rheumatoid arthritis. This medicine may be used for other purposes; ask your health care provider or pharmacist if you have questions. COMMON BRAND NAME(S): Cimzia What should I tell my health care provider before I take this medicine? They need to know if you have any of these conditions: -diabetes -heart disease -hepatitis B or history of hepatitis B infection -immune system problems -infection or history of infections -low blood counts, like low white cell, platelet, or red cell counts -multiple sclerosis -recently received or scheduled to receive a vaccine -scheduled to have surgery -tuberculosis, a positive skin test for tuberculosis or have recently been in close contact with someone who has tuberculosis -an unusual or allergic reaction to certolizumab, other medicines, foods, dyes, or preservatives -pregnant or trying to get pregnant -breast-feeding How should I use this medicine? This medicine is for injection under the skin. It is usually given by a health care professional in a hospital or clinic setting. If you get this medicine at home, you will be taught how to prepare and give this medicine. Use exactly as directed. Take your medicine at regular intervals. Do not take your medicine more often than directed. It is important that you put your used needles and syringes in a special sharps container. Do not put them in a trash can. If you do not have a sharps container, call your pharmacist or healthcare provider to get one. A special MedGuide will be given to you by the pharmacist with each prescription and refill. Be sure to read this information carefully each time. Talk to your pediatrician regarding the use of this medicine in children. Special care may be needed. Overdosage: If you think you have taken too much of this medicine  contact a poison control center or emergency room at once. NOTE: This medicine is only for you. Do not share this medicine with others. What if I miss a dose? It is important not to miss your dose. Call your doctor or health care professional if you are unable to keep an appointment. If you give yourself the medicine and you miss a dose, take it as soon as you can. If it is almost time for your next dose, take only that dose. Do not take double or extra doses. What may interact with this medicine? Do not take this medicine with any of the following medications: -abatacept -adalimumab -anakinra -etanercept -infliximab -live virus vaccines -rilonacept This medicine may also interact with the following medications: -vaccines This list may not describe all possible interactions. Give your health care provider a list of all the medicines, herbs, non-prescription drugs, or dietary supplements you use. Also tell them if you smoke, drink alcohol, or use illegal drugs. Some items may interact with your medicine. What should I watch for while using this medicine? Visit your doctor or health care professional for regular checks on your progress. Tell your doctor or healthcare professional if your symptoms do not start to get better or if they get worse. Your condition will be monitored carefully while you are receiving this medicine. You will be tested for tuberculosis (TB) before you start this medicine. If your doctor prescribes any medicine for TB, you should start taking the TB medicine before starting this medicine. Make sure to finish the full course of TB medicine. Call your doctor or health care professional for advice if you get a fever, chills,  sore throat, or other symptoms of an infection. Do not treat yourself. This medicine may decrease your body's ability to fight infection. Try to avoid being around people who are sick. Talk to your doctor about your risk of cancer. You may be more at risk  for certain types of cancers if you take this medicine. What side effects may I notice from receiving this medicine? Side effects that you should report to your doctor or health care professional as soon as possible: -allergic reactions like skin rash, itching or hives, swelling of the face, lips, or tongue -breathing problems -changes in vision -chest pain or palpitations -fever or chills, sore throat -pain, tingling, numbness in the hands or feet -red, scaly patches or raised bumps on the skin -seizures -swelling of the ankles, feet, hands -swollen lymph nodes in the neck, underarm, or groin areas -unexplained weight loss -unusual bleeding or bruising -unusually weak or tired Side effects that usually do not require medical attention (report to your doctor or health care professional if they continue or are bothersome): -irritation at site where injected This list may not describe all possible side effects. Call your doctor for medical advice about side effects. You may report side effects to FDA at 1-800-FDA-1088. Where should I keep my medicine? Keep out of the reach of children. If you are using this medicine at home, you will be instructed on how to store this medicine. Throw away any unused medicine after the expiration date on the label. NOTE: This sheet is a summary. It may not cover all possible information. If you have questions about this medicine, talk to your doctor, pharmacist, or health care provider.  2018 Elsevier/Gold Standard (2012-06-29 10:31:30) Hydroxychloroquine tablets What is this medicine? HYDROXYCHLOROQUINE (hye drox ee KLOR oh kwin) is used to treat rheumatoid arthritis and systemic lupus erythematosus. It is also used to treat malaria. This medicine may be used for other purposes; ask your health care provider or pharmacist if you have questions. COMMON BRAND NAME(S): Plaquenil, Quineprox What should I tell my health care provider before I take this  medicine? They need to know if you have any of these conditions: -diabetes -eye disease, vision problems -G6PD deficiency -history of blood diseases -history of irregular heartbeat -if you often drink alcohol -kidney disease -liver disease -porphyria -psoriasis -seizures -an unusual or allergic reaction to chloroquine, hydroxychloroquine, other medicines, foods, dyes, or preservatives -pregnant or trying to get pregnant -breast-feeding How should I use this medicine? Take this medicine by mouth with a glass of water. Follow the directions on the prescription label. Avoid taking antacids within 4 hours of taking this medicine. It is best to separate these medicines by at least 4 hours. Do not cut, crush or chew this medicine. You can take it with or without food. If it upsets your stomach, take it with food. Take your medicine at regular intervals. Do not take your medicine more often than directed. Take all of your medicine as directed even if you think you are better. Do not skip doses or stop your medicine early. Talk to your pediatrician regarding the use of this medicine in children. While this drug may be prescribed for selected conditions, precautions do apply. Overdosage: If you think you have taken too much of this medicine contact a poison control center or emergency room at once. NOTE: This medicine is only for you. Do not share this medicine with others. What if I miss a dose? If you miss a dose, take it as  soon as you can. If it is almost time for your next dose, take only that dose. Do not take double or extra doses. What may interact with this medicine? Do not take this medicine with any of the following medications: -cisapride -dofetilide -dronedarone -live virus vaccines -penicillamine -pimozide -thioridazine -ziprasidone This medicine may also interact with the following medications: -ampicillin -antacids -cimetidine -cyclosporine -digoxin -medicines for  diabetes, like insulin, glipizide, glyburide -medicines for seizures like carbamazepine, phenobarbital, phenytoin -mefloquine -methotrexate -other medicines that prolong the QT interval (cause an abnormal heart rhythm) -praziquantel This list may not describe all possible interactions. Give your health care provider a list of all the medicines, herbs, non-prescription drugs, or dietary supplements you use. Also tell them if you smoke, drink alcohol, or use illegal drugs. Some items may interact with your medicine. What should I watch for while using this medicine? Tell your doctor or healthcare professional if your symptoms do not start to get better or if they get worse. Avoid taking antacids within 4 hours of taking this medicine. It is best to separate these medicines by at least 4 hours. Tell your doctor or health care professional right away if you have any change in your eyesight. Your vision and blood may be tested before and during use of this medicine. This medicine can make you more sensitive to the sun. Keep out of the sun. If you cannot avoid being in the sun, wear protective clothing and use sunscreen. Do not use sun lamps or tanning beds/booths. What side effects may I notice from receiving this medicine? Side effects that you should report to your doctor or health care professional as soon as possible: -allergic reactions like skin rash, itching or hives, swelling of the face, lips, or tongue -changes in vision -decreased hearing or ringing of the ears -redness, blistering, peeling or loosening of the skin, including inside the mouth -seizures -sensitivity to light -signs and symptoms of a dangerous change in heartbeat or heart rhythm like chest pain; dizziness; fast or irregular heartbeat; palpitations; feeling faint or lightheaded, falls; breathing problems -signs and symptoms of liver injury like dark yellow or brown urine; general ill feeling or flu-like symptoms;  light-colored stools; loss of appetite; nausea; right upper belly pain; unusually weak or tired; yellowing of the eyes or skin -signs and symptoms of low blood sugar such as feeling anxious; confusion; dizziness; increased hunger; unusually weak or tired; sweating; shakiness; cold; irritable; headache; blurred vision; fast heartbeat; loss of consciousness -uncontrollable head, mouth, neck, arm, or leg movements Side effects that usually do not require medical attention (report to your doctor or health care professional if they continue or are bothersome): -anxious -diarrhea -dizziness -hair loss -headache -irritable -loss of appetite -nausea, vomiting -stomach pain This list may not describe all possible side effects. Call your doctor for medical advice about side effects. You may report side effects to FDA at 1-800-FDA-1088. Where should I keep my medicine? Keep out of the reach of children. In children, this medicine can cause overdose with small doses. Store at room temperature between 15 and 30 degrees C (59 and 86 degrees F). Protect from moisture and light. Throw away any unused medicine after the expiration date. NOTE: This sheet is a summary. It may not cover all possible information. If you have questions about this medicine, talk to your doctor, pharmacist, or health care provider.  2018 Elsevier/Gold Standard (2016-04-29 14:16:15)   Hydroxychloroquine tablets What is this medicine? HYDROXYCHLOROQUINE (hye drox ee KLOR oh kwin)  is used to treat rheumatoid arthritis and systemic lupus erythematosus. It is also used to treat malaria. This medicine may be used for other purposes; ask your health care provider or pharmacist if you have questions. COMMON BRAND NAME(S): Plaquenil, Quineprox What should I tell my health care provider before I take this medicine? They need to know if you have any of these conditions: -diabetes -eye disease, vision problems -G6PD deficiency -history  of blood diseases -history of irregular heartbeat -if you often drink alcohol -kidney disease -liver disease -porphyria -psoriasis -seizures -an unusual or allergic reaction to chloroquine, hydroxychloroquine, other medicines, foods, dyes, or preservatives -pregnant or trying to get pregnant -breast-feeding How should I use this medicine? Take this medicine by mouth with a glass of water. Follow the directions on the prescription label. Avoid taking antacids within 4 hours of taking this medicine. It is best to separate these medicines by at least 4 hours. Do not cut, crush or chew this medicine. You can take it with or without food. If it upsets your stomach, take it with food. Take your medicine at regular intervals. Do not take your medicine more often than directed. Take all of your medicine as directed even if you think you are better. Do not skip doses or stop your medicine early. Talk to your pediatrician regarding the use of this medicine in children. While this drug may be prescribed for selected conditions, precautions do apply. Overdosage: If you think you have taken too much of this medicine contact a poison control center or emergency room at once. NOTE: This medicine is only for you. Do not share this medicine with others. What if I miss a dose? If you miss a dose, take it as soon as you can. If it is almost time for your next dose, take only that dose. Do not take double or extra doses. What may interact with this medicine? Do not take this medicine with any of the following medications: -cisapride -dofetilide -dronedarone -live virus vaccines -penicillamine -pimozide -thioridazine -ziprasidone This medicine may also interact with the following medications: -ampicillin -antacids -cimetidine -cyclosporine -digoxin -medicines for diabetes, like insulin, glipizide, glyburide -medicines for seizures like carbamazepine, phenobarbital,  phenytoin -mefloquine -methotrexate -other medicines that prolong the QT interval (cause an abnormal heart rhythm) -praziquantel This list may not describe all possible interactions. Give your health care provider a list of all the medicines, herbs, non-prescription drugs, or dietary supplements you use. Also tell them if you smoke, drink alcohol, or use illegal drugs. Some items may interact with your medicine. What should I watch for while using this medicine? Tell your doctor or healthcare professional if your symptoms do not start to get better or if they get worse. Avoid taking antacids within 4 hours of taking this medicine. It is best to separate these medicines by at least 4 hours. Tell your doctor or health care professional right away if you have any change in your eyesight. Your vision and blood may be tested before and during use of this medicine. This medicine can make you more sensitive to the sun. Keep out of the sun. If you cannot avoid being in the sun, wear protective clothing and use sunscreen. Do not use sun lamps or tanning beds/booths. What side effects may I notice from receiving this medicine? Side effects that you should report to your doctor or health care professional as soon as possible: -allergic reactions like skin rash, itching or hives, swelling of the face, lips, or tongue -changes in  vision -decreased hearing or ringing of the ears -redness, blistering, peeling or loosening of the skin, including inside the mouth -seizures -sensitivity to light -signs and symptoms of a dangerous change in heartbeat or heart rhythm like chest pain; dizziness; fast or irregular heartbeat; palpitations; feeling faint or lightheaded, falls; breathing problems -signs and symptoms of liver injury like dark yellow or brown urine; general ill feeling or flu-like symptoms; light-colored stools; loss of appetite; nausea; right upper belly pain; unusually weak or tired; yellowing of the  eyes or skin -signs and symptoms of low blood sugar such as feeling anxious; confusion; dizziness; increased hunger; unusually weak or tired; sweating; shakiness; cold; irritable; headache; blurred vision; fast heartbeat; loss of consciousness -uncontrollable head, mouth, neck, arm, or leg movements Side effects that usually do not require medical attention (report to your doctor or health care professional if they continue or are bothersome): -anxious -diarrhea -dizziness -hair loss -headache -irritable -loss of appetite -nausea, vomiting -stomach pain This list may not describe all possible side effects. Call your doctor for medical advice about side effects. You may report side effects to FDA at 1-800-FDA-1088. Where should I keep my medicine? Keep out of the reach of children. In children, this medicine can cause overdose with small doses. Store at room temperature between 15 and 30 degrees C (59 and 86 degrees F). Protect from moisture and light. Throw away any unused medicine after the expiration date. NOTE: This sheet is a summary. It may not cover all possible information. If you have questions about this medicine, talk to your doctor, pharmacist, or health care provider.  2018 Elsevier/Gold Standard (2016-04-29 14:16:15)

## 2017-11-23 ENCOUNTER — Other Ambulatory Visit: Payer: Self-pay | Admitting: *Deleted

## 2017-11-23 ENCOUNTER — Telehealth: Payer: Self-pay | Admitting: *Deleted

## 2017-11-23 DIAGNOSIS — E559 Vitamin D deficiency, unspecified: Secondary | ICD-10-CM

## 2017-11-23 MED ORDER — VITAMIN D (ERGOCALCIFEROL) 1.25 MG (50000 UNIT) PO CAPS: 50000.0000 [IU] | ORAL_CAPSULE | ORAL | 0 refills | Status: DC

## 2017-11-23 NOTE — Progress Notes (Signed)
Labs are consistent with rheumatoid arthritis.  Her vitamin D is low.  Please call in vitamin D 50,000 units twice a week for 3 months.  Recheck vitamin D level in 3 months.

## 2017-11-23 NOTE — Telephone Encounter (Signed)
-----   Message from Bo Merino, MD sent at 11/23/2017  2:06 PM EST ----- Labs are consistent with rheumatoid arthritis.  Her vitamin D is low.  Please call in vitamin D 50,000 units twice a week for 3 months.  Recheck vitamin D level in 3 months.

## 2017-11-23 NOTE — Telephone Encounter (Signed)
error 

## 2017-11-24 ENCOUNTER — Telehealth: Payer: Self-pay | Admitting: Rheumatology

## 2017-11-24 LAB — RHEUMATOID FACTOR: Rhuematoid fact SerPl-aCnc: 18 IU/mL — ABNORMAL HIGH (ref <14)

## 2017-11-24 LAB — CBC WITH DIFFERENTIAL/PLATELET
Basophils Absolute: 40 cells/uL (ref 0–200)
Basophils Relative: 0.9 %
Eosinophils Absolute: 189 cells/uL (ref 15–500)
Eosinophils Relative: 4.3 %
HCT: 34.8 % — ABNORMAL LOW (ref 35.0–45.0)
Hemoglobin: 12 g/dL (ref 11.7–15.5)
Lymphs Abs: 1852 cells/uL (ref 850–3900)
MCH: 29.8 pg (ref 27.0–33.0)
MCHC: 34.5 g/dL (ref 32.0–36.0)
MCV: 86.4 fL (ref 80.0–100.0)
MPV: 9.4 fL (ref 7.5–12.5)
Monocytes Relative: 7.8 %
Neutro Abs: 1976 cells/uL (ref 1500–7800)
Neutrophils Relative %: 44.9 %
Platelets: 301 10*3/uL (ref 140–400)
RBC: 4.03 10*6/uL (ref 3.80–5.10)
RDW: 12.5 % (ref 11.0–15.0)
Total Lymphocyte: 42.1 %
WBC mixed population: 343 cells/uL (ref 200–950)
WBC: 4.4 10*3/uL (ref 3.8–10.8)

## 2017-11-24 LAB — IGG, IGA, IGM
IgG (Immunoglobin G), Serum: 1176 mg/dL (ref 694–1618)
IgM, Serum: 224 mg/dL (ref 48–271)
Immunoglobulin A: 345 mg/dL (ref 81–463)

## 2017-11-24 LAB — COMPLETE METABOLIC PANEL WITH GFR
AG Ratio: 1.6 (calc) (ref 1.0–2.5)
ALT: 9 U/L (ref 6–29)
AST: 10 U/L (ref 10–30)
Albumin: 4.2 g/dL (ref 3.6–5.1)
Alkaline phosphatase (APISO): 46 U/L (ref 33–115)
BUN: 8 mg/dL (ref 7–25)
CO2: 28 mmol/L (ref 20–32)
Calcium: 9.5 mg/dL (ref 8.6–10.2)
Chloride: 105 mmol/L (ref 98–110)
Creat: 0.65 mg/dL (ref 0.50–1.10)
GFR, Est African American: 136 mL/min/{1.73_m2} (ref >=60)
GFR, Est Non African American: 117 mL/min/{1.73_m2} (ref >=60)
Globulin: 2.6 g/dL (calc) (ref 1.9–3.7)
Glucose, Bld: 81 mg/dL (ref 65–99)
Potassium: 4.4 mmol/L (ref 3.5–5.3)
Sodium: 139 mmol/L (ref 135–146)
Total Bilirubin: 0.3 mg/dL (ref 0.2–1.2)
Total Protein: 6.8 g/dL (ref 6.1–8.1)

## 2017-11-24 LAB — ANTI-NUCLEAR AB-TITER (ANA TITER): ANA Titer 1: 1:40 {titer} — ABNORMAL HIGH

## 2017-11-24 LAB — URINALYSIS, ROUTINE W REFLEX MICROSCOPIC
Bilirubin Urine: NEGATIVE
Glucose, UA: NEGATIVE
Hgb urine dipstick: NEGATIVE
Ketones, ur: NEGATIVE
Leukocytes, UA: NEGATIVE
Nitrite: NEGATIVE
Protein, ur: NEGATIVE
Specific Gravity, Urine: 1.021 (ref 1.001–1.03)
pH: 7 (ref 5.0–8.0)

## 2017-11-24 LAB — QUANTIFERON-TB GOLD PLUS
Mitogen-NIL: >10 IU/mL
NIL: 0.1 IU/mL
QuantiFERON-TB Gold Plus: NEGATIVE
TB1-NIL: <0 IU/mL
TB2-NIL: <0 IU/mL

## 2017-11-24 LAB — PROTEIN ELECTROPHORESIS, SERUM, WITH REFLEX
Albumin ELP: 4.1 g/dL (ref 3.8–4.8)
Alpha 1: 0.3 g/dL (ref 0.2–0.3)
Alpha 2: 0.6 g/dL (ref 0.5–0.9)
Beta 2: 0.4 g/dL (ref 0.2–0.5)
Beta Globulin: 0.4 g/dL (ref 0.4–0.6)
Gamma Globulin: 1.2 g/dL (ref 0.8–1.7)
Total Protein: 7 g/dL (ref 6.1–8.1)

## 2017-11-24 LAB — SEDIMENTATION RATE: Sed Rate: 28 mm/h — ABNORMAL HIGH (ref 0–20)

## 2017-11-24 LAB — ANA: Anti Nuclear Antibody(ANA): POSITIVE — AB

## 2017-11-24 LAB — CK: Total CK: 78 U/L (ref 29–143)

## 2017-11-24 LAB — TSH: TSH: 0.64 mIU/L

## 2017-11-24 LAB — CYCLIC CITRUL PEPTIDE ANTIBODY, IGG: Cyclic Citrullin Peptide Ab: 240 UNITS — ABNORMAL HIGH

## 2017-11-24 LAB — HIV ANTIBODY (ROUTINE TESTING W REFLEX): HIV 1&2 Ab, 4th Generation: NONREACTIVE

## 2017-11-24 LAB — VITAMIN D 25 HYDROXY (VIT D DEFICIENCY, FRACTURES): Vit D, 25-Hydroxy: 10 ng/mL — ABNORMAL LOW (ref 30–100)

## 2017-11-24 NOTE — Telephone Encounter (Signed)
Patient called stating that the Vitamin D that Dr. Estanislado Pandy prescribed are gel capsules which patient states she cannot take without getting sick.  Patient is requesting a new prescription of Vitamin D that are not gel capsules be sent to [facility].

## 2017-11-24 NOTE — Telephone Encounter (Signed)
Patient may try over-the-counter vitamin D 5000 units daily for 3 months and we can check her labs in 3 months.

## 2017-11-24 NOTE — Telephone Encounter (Signed)
Patient states she can not tolerate gel capsules, is there an alternative we can prescribe?

## 2017-11-24 NOTE — Telephone Encounter (Signed)
Left message on machine for patient to call the office.

## 2017-11-29 ENCOUNTER — Other Ambulatory Visit: Payer: Self-pay

## 2017-11-29 ENCOUNTER — Encounter (HOSPITAL_BASED_OUTPATIENT_CLINIC_OR_DEPARTMENT_OTHER): Payer: Self-pay | Admitting: Emergency Medicine

## 2017-11-29 NOTE — Progress Notes (Signed)
SPOKE WITH:patient  RIDING HOME WITH: mother Levada Dy  AM MEDICATIONS: none  NPO STATUS: npo after midnight including gum mint candy  LABS:cbc, cmet urinalysis current in epic  COMMENTS/CONCERNS: advised no marijuana smoking 24 hours prior to surgery. Advised patient wear a sanitary bad for menstruation  ICE,;RN, BSN.

## 2017-12-01 ENCOUNTER — Ambulatory Visit (HOSPITAL_BASED_OUTPATIENT_CLINIC_OR_DEPARTMENT_OTHER): Payer: BLUE CROSS/BLUE SHIELD | Admitting: Anesthesiology

## 2017-12-01 ENCOUNTER — Encounter (HOSPITAL_BASED_OUTPATIENT_CLINIC_OR_DEPARTMENT_OTHER)
Admission: RE | Disposition: A | Discharge status: Home / Self Care | Payer: Self-pay | Source: Ambulatory Visit | Attending: Obstetrics and Gynecology

## 2017-12-01 ENCOUNTER — Ambulatory Visit (HOSPITAL_BASED_OUTPATIENT_CLINIC_OR_DEPARTMENT_OTHER)
Admission: RE | Admit: 2017-12-01 | Discharge: 2017-12-01 | Disposition: A | Discharge status: Home / Self Care | Payer: BLUE CROSS/BLUE SHIELD | Source: Ambulatory Visit | Attending: Obstetrics and Gynecology | Admitting: Obstetrics and Gynecology

## 2017-12-01 ENCOUNTER — Encounter (HOSPITAL_BASED_OUTPATIENT_CLINIC_OR_DEPARTMENT_OTHER): Payer: Self-pay

## 2017-12-01 DIAGNOSIS — N838 Other noninflammatory disorders of ovary, fallopian tube and broad ligament: Secondary | ICD-10-CM | POA: Diagnosis not present

## 2017-12-01 DIAGNOSIS — Z803 Family history of malignant neoplasm of breast: Secondary | ICD-10-CM | POA: Insufficient documentation

## 2017-12-01 DIAGNOSIS — N736 Female pelvic peritoneal adhesions (postinfective): Secondary | ICD-10-CM | POA: Insufficient documentation

## 2017-12-01 DIAGNOSIS — N7011 Chronic salpingitis: Secondary | ICD-10-CM | POA: Insufficient documentation

## 2017-12-01 DIAGNOSIS — Z87891 Personal history of nicotine dependence: Secondary | ICD-10-CM | POA: Diagnosis not present

## 2017-12-01 DIAGNOSIS — Z90721 Acquired absence of ovaries, unilateral: Secondary | ICD-10-CM | POA: Insufficient documentation

## 2017-12-01 DIAGNOSIS — Z9079 Acquired absence of other genital organ(s): Secondary | ICD-10-CM | POA: Diagnosis not present

## 2017-12-01 HISTORY — DX: Benign neoplasm of unspecified ovary: D27.9

## 2017-12-01 HISTORY — DX: Presence of spectacles and contact lenses: Z97.3

## 2017-12-01 HISTORY — PX: LAPAROSCOPIC UNILATERAL SALPINGECTOMY: SHX5934

## 2017-12-01 HISTORY — DX: Raynaud's syndrome without gangrene: I73.00

## 2017-12-01 LAB — POCT PREGNANCY, URINE: Preg Test, Ur: NEGATIVE

## 2017-12-01 SURGERY — SALPINGECTOMY, UNILATERAL, LAPAROSCOPIC
Anesthesia: General | Site: Abdomen | Laterality: Right

## 2017-12-01 MED ORDER — PROPOFOL 10 MG/ML IV BOLUS
INTRAVENOUS | Status: DC | PRN
  Administered 2017-12-01: 200 mg via INTRAVENOUS

## 2017-12-01 MED ORDER — KETOROLAC TROMETHAMINE 30 MG/ML IJ SOLN
INTRAMUSCULAR | Status: AC
  Filled 2017-12-01: qty 1

## 2017-12-01 MED ORDER — SODIUM CHLORIDE 0.9 % IR SOLN
Status: DC | PRN
  Administered 2017-12-01: 2000 mL

## 2017-12-01 MED ORDER — FENTANYL CITRATE (PF) 100 MCG/2ML IJ SOLN
INTRAMUSCULAR | Status: AC
  Filled 2017-12-01: qty 2

## 2017-12-01 MED ORDER — FENTANYL CITRATE (PF) 250 MCG/5ML IJ SOLN
INTRAMUSCULAR | Status: AC
  Filled 2017-12-01: qty 5

## 2017-12-01 MED ORDER — LIDOCAINE HCL (CARDIAC) 20 MG/ML IV SOLN
INTRAVENOUS | Status: DC | PRN
  Administered 2017-12-01: 50 mg via INTRAVENOUS

## 2017-12-01 MED ORDER — BUPIVACAINE HCL (PF) 0.25 % IJ SOLN
INTRAMUSCULAR | Status: DC | PRN
  Administered 2017-12-01: 13 mL

## 2017-12-01 MED ORDER — PROPOFOL 10 MG/ML IV BOLUS
INTRAVENOUS | Status: AC
  Filled 2017-12-01: qty 20

## 2017-12-01 MED ORDER — KETOROLAC TROMETHAMINE 30 MG/ML IJ SOLN
INTRAMUSCULAR | Status: DC | PRN
  Administered 2017-12-01: 30 mg via INTRAVENOUS

## 2017-12-01 MED ORDER — FENTANYL CITRATE (PF) 100 MCG/2ML IJ SOLN
25.0000 ug | INTRAMUSCULAR | Status: DC | PRN
  Administered 2017-12-01 (×2): 25 ug via INTRAVENOUS
  Filled 2017-12-01: qty 1

## 2017-12-01 MED ORDER — PROPOFOL 500 MG/50ML IV EMUL
INTRAVENOUS | Status: AC
  Filled 2017-12-01: qty 50

## 2017-12-01 MED ORDER — SUGAMMADEX SODIUM 200 MG/2ML IV SOLN
INTRAVENOUS | Status: DC | PRN
  Administered 2017-12-01: 200 mg via INTRAVENOUS

## 2017-12-01 MED ORDER — FENTANYL CITRATE (PF) 100 MCG/2ML IJ SOLN
INTRAMUSCULAR | Status: DC | PRN
  Administered 2017-12-01 (×2): 100 ug via INTRAVENOUS
  Administered 2017-12-01: 50 ug via INTRAVENOUS

## 2017-12-01 MED ORDER — LACTATED RINGERS IV SOLN
INTRAVENOUS | Status: DC
Start: 2017-12-01 — End: 2017-12-01
  Administered 2017-12-01 (×2): via INTRAVENOUS
  Filled 2017-12-01: qty 1000

## 2017-12-01 MED ORDER — MIDAZOLAM HCL 2 MG/2ML IJ SOLN
INTRAMUSCULAR | Status: AC
  Filled 2017-12-01: qty 2

## 2017-12-01 MED ORDER — METHYLENE BLUE 0.5 % INJ SOLN
INTRAVENOUS | Status: DC | PRN
  Administered 2017-12-01: 1 mL

## 2017-12-01 MED ORDER — BUPIVACAINE-EPINEPHRINE 0.25% -1:200000 IJ SOLN: INTRAMUSCULAR | Status: DC | PRN

## 2017-12-01 MED ORDER — OXYCODONE-ACETAMINOPHEN 7.5-325 MG PO TABS: 1.0000 | ORAL_TABLET | ORAL | 0 refills | Status: DC | PRN

## 2017-12-01 MED ORDER — HEMOSTATIC AGENTS (NO CHARGE) OPTIME
TOPICAL | Status: DC | PRN
  Administered 2017-12-01: 1 via TOPICAL

## 2017-12-01 MED ORDER — ROCURONIUM BROMIDE 100 MG/10ML IV SOLN
INTRAVENOUS | Status: DC | PRN
  Administered 2017-12-01: 50 mg via INTRAVENOUS

## 2017-12-01 MED ORDER — CEFAZOLIN SODIUM-DEXTROSE 2-4 GM/100ML-% IV SOLN
2.0000 g | INTRAVENOUS | Status: AC
  Administered 2017-12-01: 2 g via INTRAVENOUS
  Filled 2017-12-01: qty 100

## 2017-12-01 MED ORDER — DEXAMETHASONE SODIUM PHOSPHATE 10 MG/ML IJ SOLN
INTRAMUSCULAR | Status: DC | PRN
  Administered 2017-12-01: 5 mg via INTRAVENOUS

## 2017-12-01 MED ORDER — CEFAZOLIN SODIUM-DEXTROSE 2-4 GM/100ML-% IV SOLN
INTRAVENOUS | Status: AC
  Filled 2017-12-01: qty 100

## 2017-12-01 MED ORDER — MIDAZOLAM HCL 5 MG/5ML IJ SOLN
INTRAMUSCULAR | Status: DC | PRN
  Administered 2017-12-01: 2 mg via INTRAVENOUS

## 2017-12-01 MED ORDER — ONDANSETRON HCL 4 MG/2ML IJ SOLN
INTRAMUSCULAR | Status: DC | PRN
  Administered 2017-12-01: 4 mg via INTRAVENOUS

## 2017-12-01 MED ORDER — ONDANSETRON HCL 4 MG PO TABS: 4.0000 mg | ORAL_TABLET | Freq: Three times a day (TID) | ORAL | 0 refills | Status: DC | PRN

## 2017-12-01 SURGICAL SUPPLY — 41 items
BARRIER ADHS 3X4 INTERCEED (GAUZE/BANDAGES/DRESSINGS) ×2 IMPLANT
CABLE HIGH FREQUENCY MONO STRZ (ELECTRODE) ×2 IMPLANT
CLOTH BEACON ORANGE TIMEOUT ST (SAFETY) IMPLANT
CONT SPECI 4OZ STER CLIK (MISCELLANEOUS) ×2 IMPLANT
DERMABOND ADVANCED (GAUZE/BANDAGES/DRESSINGS) ×1
DERMABOND ADVANCED .7 DNX12 (GAUZE/BANDAGES/DRESSINGS) ×1 IMPLANT
DRSG COVADERM PLUS 2X2 (GAUZE/BANDAGES/DRESSINGS) ×2 IMPLANT
DRSG OPSITE POSTOP 3X4 (GAUZE/BANDAGES/DRESSINGS) ×2 IMPLANT
DURAPREP 26ML APPLICATOR (WOUND CARE) ×2 IMPLANT
ELECT REM PT RETURN 9FT ADLT (ELECTROSURGICAL) ×2
ELECTRODE REM PT RTRN 9FT ADLT (ELECTROSURGICAL) ×1 IMPLANT
GLOVE BIO SURGEON STRL SZ8 (GLOVE) ×4 IMPLANT
GLOVE BIOGEL PI IND STRL 7.5 (GLOVE) ×4 IMPLANT
GLOVE BIOGEL PI INDICATOR 7.5 (GLOVE) ×4
GOWN STRL REUS W/TWL LRG LVL3 (GOWN DISPOSABLE) ×8 IMPLANT
IV NS 1000ML (IV SOLUTION) ×2
IV NS 1000ML BAXH (IV SOLUTION) ×2 IMPLANT
IV NS 500ML (IV SOLUTION) ×1
IV NS 500ML BAXH (IV SOLUTION) ×1 IMPLANT
MANIPULATOR UTERINE 4.5 ZUMI (MISCELLANEOUS) ×2 IMPLANT
NEEDLE INSUFFLATION 120MM (ENDOMECHANICALS) ×2 IMPLANT
PACK LAPAROSCOPY BASIN (CUSTOM PROCEDURE TRAY) ×2 IMPLANT
PACK TRENDGUARD 450 HYBRID PRO (MISCELLANEOUS) ×1 IMPLANT
SEPRAFILM MEMBRANE 5X6 (MISCELLANEOUS) IMPLANT
SET IRRIG TUBING LAPAROSCOPIC (IRRIGATION / IRRIGATOR) IMPLANT
SET TRI-LUMEN FLTR TB AIRSEAL (TUBING) ×2 IMPLANT
SUT MNCRL AB 4-0 PS2 18 (SUTURE) ×2 IMPLANT
SYR 20CC LL (SYRINGE) ×2 IMPLANT
SYR 30ML LL (SYRINGE) ×2 IMPLANT
SYR 50ML LL SCALE MARK (SYRINGE) IMPLANT
SYR 5ML LL (SYRINGE) ×2 IMPLANT
SYR CONTROL 10ML LL (SYRINGE) IMPLANT
SYS BAG RETRIEVAL 10MM (BASKET)
SYSTEM BAG RETRIEVAL 10MM (BASKET) IMPLANT
TOWEL OR 17X24 6PK STRL BLUE (TOWEL DISPOSABLE) ×4 IMPLANT
TRAY FOLEY CATH SILVER 14FR (SET/KITS/TRAYS/PACK) ×2 IMPLANT
TRENDGUARD 450 HYBRID PRO PACK (MISCELLANEOUS) ×2
TROCAR OPTI TIP 5M 100M (ENDOMECHANICALS) ×4 IMPLANT
TROCAR PORT AIRSEAL 5X120 (TROCAR) ×2 IMPLANT
TUBING INSUF HEATED (TUBING) IMPLANT
WARMER LAPAROSCOPE (MISCELLANEOUS) ×2 IMPLANT

## 2017-12-01 NOTE — Anesthesia Procedure Notes (Signed)
Procedure Name: Intubation Date/Time: 12/01/2017 2:11 PM Performed by: Jonna Munro, CRNA Pre-anesthesia Checklist: Patient identified, Emergency Drugs available, Suction available, Patient being monitored and Timeout performed Patient Re-evaluated:Patient Re-evaluated prior to induction Oxygen Delivery Method: Circle system utilized Preoxygenation: Pre-oxygenation with 100% oxygen Induction Type: IV induction Ventilation: Mask ventilation without difficulty Grade View: Grade I Tube type: Oral Tube size: 7.0 mm Number of attempts: 1 Airway Equipment and Method: Stylet Placement Confirmation: ETT inserted through vocal cords under direct vision,  positive ETCO2 and breath sounds checked- equal and bilateral Secured at: 22 cm Tube secured with: Tape Dental Injury: Teeth and Oropharynx as per pre-operative assessment

## 2017-12-01 NOTE — H&P (Signed)
Meghan Arnold is a 32 y.o. female , originally referred to me by Dr. Prentice Docker, for L/S, removal of hydrosalpinx of right, possible salpingectomy. She was diagnosed with hydrosalpinx when her HSG noted Cornal opasification,  but does no fill into distal portion of the right tube. Surgically absent left tube. Patient would like to preserve her childbearing potential.  Pertinent Gynecological History: Menses: flow is excessive with use of 3 pads or tampons on heaviest days Bleeding: dysfunctional uterine bleeding Contraception: none DES exposure: denies Blood transfusions: none Sexually transmitted diseases: no past history Previous GYN Procedures: Left salpingectomy, Laparoscopy, Cystectomy   Last mammogram: normal Last pap: normal  OB History: G0P0   Menstrual History: Menarche age: 50 No LMP recorded.    Past Medical History:  Diagnosis Date  . Benign tumor of ovary 2013  . Chlamydia   . Family history of breast cancer   . Raynaud's phenomenon without gangrene    mgmt rheum Dr Annalee Genta  . Rheumatoid arthritis ([place]) 2017  . Wears contact lenses                     Past Surgical History:  Procedure Laterality Date  . LAPAROSCOPIC SALPINGOOPHERECTOMY  2013   done by dr. Kerin Perna , believes right side laterality   . LAPAROSCOPY    . OVARIAN CYST REMOVAL  08/2012   dr Glennon Mac at [facility] in [place] ; has abdominal insision              Family History  Problem Relation Age of Onset  . Colon cancer Maternal Grandfather   . Breast cancer Maternal Grandmother 72  . Breast cancer Other 62  . Breast cancer Cousin 40   No hereditary disease.  No cancer of breast, ovary, uterus. No cutaneous leiomyomatosis or renal cell carcinoma.  Social History   Socioeconomic History  . Marital status: Single    Spouse name: Not on file  . Number of children: Not on file  . Years of education: Not on file  . Highest education level: Not on file  Social  Needs  . Financial resource strain: Not on file  . Food insecurity - worry: Not on file  . Food insecurity - inability: Not on file  . Transportation needs - medical: Not on file  . Transportation needs - non-medical: Not on file  Occupational History  . Not on file  Tobacco Use  . Smoking status: Former Smoker    Years: 2.00    Types: Cigarettes    Last attempt to quit: 2004    Years since quitting: 15.1  . Smokeless tobacco: Never Used  . Tobacco comment: black and milds ; 2 per week on avg   Substance and Sexual Activity  . Alcohol use: Yes    Alcohol/week: 1.8 oz    Types: 3 Glasses of wine per week    Comment: wine   . Drug use: Yes    Types: Marijuana    Comment: last used yesterday   . Sexual activity: Yes    Birth control/protection: None  Other Topics Concern  . Not on file  Social History Narrative  . Not on file    No Known Allergies  No current facility-administered medications on file prior to encounter.    Current Outpatient Medications on File Prior to Encounter  Medication Sig Dispense Refill  . folic acid (FOLVITE) 1 MG tablet Take 1 mg by mouth daily.    . hydroxychloroquine (PLAQUENIL) 200  MG tablet 200 mg 2 (two) times daily.   5     Review of Systems  Constitutional: Negative.   HENT: Negative.   Eyes: Negative.   Respiratory: Negative.   Cardiovascular: Negative.   Gastrointestinal: Negative.   Genitourinary: Negative.   Musculoskeletal: Negative.   Skin: Negative.   Neurological: Negative.   Endo/Heme/Allergies: Negative.   Psychiatric/Behavioral: Negative.      Physical Exam  BP 124/75   Temp 98.5 F (36.9 C) (Oral)   Resp 16   Ht 5\' 5"  (1.651 m)   Wt 64.2 kg (141 lb 7 oz)   LMP 11/28/2017   SpO2 100%   BMI 23.54 kg/m  Constitutional: She is oriented to person, place, and time. She appears well-developed and well-nourished.  HENT:  Head: Normocephalic and atraumatic.  Nose: Nose normal.  Mouth/Throat: Oropharynx is clear  and moist. No oropharyngeal exudate.  Eyes: Conjunctivae normal and EOM are normal. Pupils are equal, round, and reactive to light. No scleral icterus.  Neck: Normal range of motion. Neck supple. No tracheal deviation present. No thyromegaly present.  Cardiovascular: Normal rate.   Respiratory: Effort normal and breath sounds normal.  GI: Soft. Bowel sounds are normal. She exhibits no distension and no mass. There is no tenderness.  Lymphadenopathy:    She has no cervical adenopathy.  Neurological: She is alert and oriented to person, place, and time. She has normal reflexes.  Skin: Skin is warm.  Psychiatric: She has a normal mood and affect. Her behavior is normal. Judgment and thought content normal.       Assessment/Plan:  Right occlusion and cornal opasifaction Preoperative for L/S, removal of hydrosalpinx of right, possible salpingectomy Benefits and risks of L/S, removal of hydrosalpinx of right, possible salpingectomy were discussed with the patient and her family member again. All of patient's questions were answered.  She verbalized understanding.

## 2017-12-01 NOTE — Anesthesia Preprocedure Evaluation (Addendum)
Anesthesia Evaluation  Patient identified by MRN, date of birth, ID band Patient awake    Reviewed: Allergy & Precautions, NPO status , Patient's Chart, lab work & pertinent test results  History of Anesthesia Complications (+) PSEUDOCHOLINESTERASE DEFICIENCY  Airway Mallampati: II  TM Distance: >3 FB     Dental   Pulmonary neg pulmonary ROS, former smoker,    breath sounds clear to auscultation       Cardiovascular negative cardio ROS   Rhythm:Regular Rate:Normal     Neuro/Psych    GI/Hepatic negative GI ROS, Neg liver ROS,   Endo/Other  negative endocrine ROS  Renal/GU      Musculoskeletal  (+) Arthritis ,   Abdominal   Peds  Hematology   Anesthesia Other Findings   Reproductive/Obstetrics                            Anesthesia Physical Anesthesia Plan  ASA: II  Anesthesia Plan: General   Post-op Pain Management:    Induction: Intravenous  PONV Risk Score and Plan: 3 and Treatment may vary due to age or medical condition  Airway Management Planned: Oral ETT  Additional Equipment:   Intra-op Plan:   Post-operative Plan: Extubation in OR  Informed Consent: I have reviewed the patients History and Physical, chart, labs and discussed the procedure including the risks, benefits and alternatives for the proposed anesthesia with the patient or authorized representative who has indicated his/her understanding and acceptance.   Dental advisory given  Plan Discussed with: CRNA and Anesthesiologist  Anesthesia Plan Comments:         Anesthesia Quick Evaluation

## 2017-12-01 NOTE — Op Note (Signed)
Operative Note  Preoperative diagnosis: Right hydrosalpinx, probable pelvic adhesions, status post left salpingo-oophorectomy for dermoid  Postoperative diagnosis: Right hydrosalpinx, dense pelvic adhesions, probable right ovarian cyst, status post left salpingo-oophorectomy for dermoid  Procedure: Laparoscopy, lysis of adhesions, right salpingo-oophorolysis, right fimbrioplasty, chromotubation, right ovarian cyst aspiration  Anesthesia: Gen. endotracheal  Complications: None  Estimated blood loss: 50 cc  Specimens: Uterine adhesion to pathology  Findings: On laparoscopy, upper abdomen, liver surface and diaphragm surfaces were normal. Gallbladder was normal. The appendix was normal. The pelvic peritoneum looked mostly normal.  The left tube and ovary were surgically absent. There was a filmy adhesion between the left side of the posterior uterine serosa and the cul-de-sac. This was excised. Significant amount of post surgical adhesions were present involving the right adnexa: The distal portion of the right fallopian tube (50%) was densely adherent with cohesive adhesions to the right ovary.  There was a small tuft of fimbria visible at the distal and, giving a rating of 2 out of 5 to the fimbria. There was a 1 x 1 cm paratubal cyst on the distal portion of the tube as well. This was drained.  The distal end of the right hydrosalpinx was additionally adherent with the dense adhesion to the posterior cul-de-sac peritoneum. This was lysed as well. Chromotubation allowed the methylene blue to come to the distal end of the tube although spillage per se was not visualized. The right ovary was densely adherent to the posterior uterus, the right tube and right ovarian fossa with cohesive adhesions (two thirds of its surface area), even though we were able to lyse all of the adhesions between the ovary and ovarian fossa.  Description of the procedure: The patient was placed in dorsal supine position  and general endotracheal anesthesia was given. 2 g of cefazolin were given intravenously for prophylaxis. Patient was placed in lithotomy position. She was prepped and draped inside manner.  A Foley catheter was inserted into the bladder. A ZUMI catheter was placed into the uterine cavity. The uterus sounded to 8 cm. The surgeon was regloved and a surgical field was created on the abdomen.  After preemptive anesthesia of all surgical sites with 0.25% bupivacaine with 1 200,000 epinephrine, a 5 mm intraumbilical skin incision was made and a Verress needle was inserted. Its correct location was confirmed. A pneumoperitoneum was created with carbon dioxide.  5 mm laparoscope with a 30 lens was inserted and video laparoscopy was started . A left lower quadrant 5 mm and a right lower quadrant  5 mm incisions were made and ancillary trochars were placed under direct visualization. We resumed with Air Seal insufflation. Above findings were noted.   Using a needle electrode on 2 W of cutting current, we methodically lysed all of the adhesions between the uterus and the right tube and right utero-ovarian ligament, right ovary and ovarian fossa, and some of the adhesions between the right tube and the right ovary. We used some blunt dissection in the case of the adhesiolysis between the ovary and the pelvic sidewall. The resulting small bleeding areas were coagulated with needle electrode. We tried to aspirate the right ovarian cyst that was visualized by preoperative pelvic ultrasound but got only limited amount of clear fluid and no dermoid contents. We deferred further action and possible right ovarian cyst out of concern for diminishing patient's ovarian reserve further. Exam performed the fimbria lysis by stretching the fibrotic distal end of the right tube over the opening jaws of  a [place] forceps and using careful needle electrode tip with 48 W of cutting current to cut the fibrous adhesions causing the  phimosis. We opted not to do any suturing to evert the edges of the fimbrioplasty as there were still dense adhesions on the side of the tube adjacent to the right ovary. Nevertheless a much wider opening was obtained with the fimbrioplasty. Initially chromotubation allowed filling of the right tube up until the infundibulum of the tube but later, we could not get the dye to go into the right tube. A postoperative HSG will be done to clarify this further. In addition preoperative HSG showed complete filling of the right tube. Good hemostasis was obtained after further pinpoint electrocautery in the right ovarian fossa and surface of the right ovary and distal end of the right tube. Pelvis was copiously irrigated and aspirated. A sheet of Interceed was dropped into the pelvis and it was used to cover the right ovarian fossa denuded area, the right ovary and distal end of the right tube. The instruments were removed. The gas was allowed to escape. The skin incisions were approximated with Dermabond.   The patient tolerated the procedure well and was transferred to recovery room in satisfactory condition.  Governor Specking, MD

## 2017-12-01 NOTE — Transfer of Care (Signed)
Immediate Anesthesia Transfer of Care Note  Patient: Ruthy S Batrez  Procedure(s) Performed: LAPAROSCOPIC LYSIS OF ADHESIONS, RIGHT TUBAL OVARIOLYSIS, RIGHT OVARIAN CYST ASPIRATION,CHROMOPERTUBATION, RIGHT FIMBRIOPLASTY (Right Abdomen)  Patient Location: PACU  Anesthesia Type:General  Level of Consciousness: awake, alert  and oriented  Airway & Oxygen Therapy: Patient Spontanous Breathing and Patient connected to nasal cannula oxygen  Post-op Assessment: Report given to RN and Post -op Vital signs reviewed and stable  Post vital signs: Reviewed and stable  Last Vitals:  Vitals:   12/01/17 1004  BP: 124/75  Resp: 16  Temp: 36.9 C  SpO2: 100%    Last Pain:  Vitals:   12/01/17 1004  TempSrc: Oral      Patients Stated Pain Goal: 8 (41/36/43 [id number])  Complications: No apparent anesthesia complications

## 2017-12-01 NOTE — Anesthesia Postprocedure Evaluation (Signed)
Anesthesia Post Note  Patient: Meghan Arnold  Procedure(s) Performed: LAPAROSCOPIC LYSIS OF ADHESIONS, RIGHT TUBAL OVARIOLYSIS, RIGHT OVARIAN CYST ASPIRATION,CHROMOPERTUBATION, RIGHT FIMBRIOPLASTY (Right Abdomen)     Patient location during evaluation: PACU Anesthesia Type: General Level of consciousness: awake and alert Pain management: pain level controlled Vital Signs Assessment: post-procedure vital signs reviewed and stable Respiratory status: spontaneous breathing, nonlabored ventilation, respiratory function stable and patient connected to nasal cannula oxygen Cardiovascular status: blood pressure returned to baseline and stable Postop Assessment: no apparent nausea or vomiting Anesthetic complications: no    Last Vitals:  Vitals:   12/01/17 1630 12/01/17 1637  BP: 136/87   Pulse: 88 90  Resp: 15 12  Temp:    SpO2: 100% 100%    Last Pain:  Vitals:   12/01/17 1637  TempSrc:   PainSc: 5                  Devaney Segers S

## 2017-12-01 NOTE — Discharge Instructions (Signed)
DISCHARGE INSTRUCTIONS: Laparoscopy  The following instructions have been prepared to help you care for yourself upon your return home today.  Wound care:  Do not get the incision wet for the first 24 hours. The incision should be kept clean and dry.  The Band-Aids or dressings may be removed the day after surgery.  Should the incision become sore, red, and swollen after the first week, check with your doctor.  Personal hygiene:  Shower the day after your procedure.  Activity and limitations:  Do NOT drive or operate any equipment today.  Do NOT lift anything more than 15 pounds for 2-3 weeks after surgery.  Do NOT rest in bed all day.  Walking is encouraged. Walk each day, starting slowly with 5-minute walks 3 or 4 times a day. Slowly increase the length of your walks.  Walk up and down stairs slowly.  Do NOT do strenuous activities, such as golfing, playing tennis, bowling, running, biking, weight lifting, gardening, mowing, or vacuuming for 2-4 weeks. Ask your doctor when it is okay to start.  Diet: Eat a light meal as desired this evening. You may resume your usual diet tomorrow.  Return to work: This is dependent on the type of work you do. For the most part you can return to a desk job within a week of surgery. If you are more active at work, please discuss this with your doctor.  What to expect after your surgery: You may have a slight burning sensation when you urinate on the first day. You may have a very small amount of blood in the urine. Expect to have a small amount of vaginal discharge/light bleeding for 1-2 weeks. It is not unusual to have abdominal soreness and bruising for up to 2 weeks. You may be tired and need more rest for about 1 week. You may experience shoulder pain for 24-72 hours. Lying flat in bed may relieve it.  Call your doctor for any of the following:  Develop a fever of 100.4 or greater  Inability to urinate 6 hours after discharge from  hospital  Severe pain not relieved by pain medications  Persistent of heavy bleeding at incision site  Redness or swelling around incision site after a week  Increasing nausea or vomiting   No sexual activity until you have seen your doctor.   Do not take any nonsteroidal anti inflammatories (Ibuprofen, Advil, motrin, Aleve) until after 9:45 pm today.     Post Anesthesia Home Care Instructions  Activity: Get plenty of rest for the remainder of the day. A responsible individual must stay with you for 24 hours following the procedure.  For the next 24 hours, DO NOT: -Drive a car -Paediatric nurse -Drink alcoholic beverages -Take any medication unless instructed by your physician -Make any legal decisions or sign important papers.  Meals: Start with liquid foods such as gelatin or soup. Progress to regular foods as tolerated. Avoid greasy, spicy, heavy foods. If nausea and/or vomiting occur, drink only clear liquids until the nausea and/or vomiting subsides. Call your physician if vomiting continues.  Special Instructions/Symptoms: Your throat may feel dry or sore from the anesthesia or the breathing tube placed in your throat during surgery. If this causes discomfort, gargle with warm salt water. The discomfort should disappear within 24 hours.

## 2017-12-02 ENCOUNTER — Encounter (HOSPITAL_BASED_OUTPATIENT_CLINIC_OR_DEPARTMENT_OTHER): Payer: Self-pay | Admitting: Obstetrics and Gynecology

## 2017-12-03 DIAGNOSIS — I7300 Raynaud's syndrome without gangrene: Secondary | ICD-10-CM | POA: Insufficient documentation

## 2017-12-03 DIAGNOSIS — M0579 Rheumatoid arthritis with rheumatoid factor of multiple sites without organ or systems involvement: Secondary | ICD-10-CM | POA: Insufficient documentation

## 2017-12-03 DIAGNOSIS — E559 Vitamin D deficiency, unspecified: Secondary | ICD-10-CM | POA: Insufficient documentation

## 2017-12-03 DIAGNOSIS — Z79899 Other long term (current) drug therapy: Secondary | ICD-10-CM | POA: Insufficient documentation

## 2017-12-03 NOTE — Progress Notes (Signed)
Office Visit Note  Patient: Meghan Arnold             Date of Birth: 12-Dec-1985           MRN: [id number]             PCP: Patient, No Pcp Per Referring: No ref. provider found Visit Date: 12/13/2017 Occupation: '@GUAROCC' @    Subjective:  Pain and swelling in hands.  History of Present Illness: Meghan Arnold is a 32 y.o. female with history of seropositive rheumatoid arthritis.  She started Plaquenil after the last visit.  She has noticed some improvement in her hands.  She still continues to have some stiffness and swelling in her hands off and on.  She has occasional discomfort in her knee joints.  None of the other joints are painful.  She would like to give Plaquenil more time before starting Cimzia.  She is a still planning pregnancy.  Activities of Daily Living:  Patient reports morning stiffness for 0 minutes.   Patient Denies nocturnal pain.  Difficulty dressing/grooming: Denies Difficulty climbing stairs: Denies Difficulty getting out of chair: Denies Difficulty using hands for taps, buttons, cutlery, and/or writing: Denies   Review of Systems  Constitutional: Negative for fatigue and weakness.  HENT: Negative for mouth sores, trouble swallowing, trouble swallowing, mouth dryness and nose dryness.   Eyes: Negative for pain, visual disturbance and dryness.  Respiratory: Negative for cough, hemoptysis, shortness of breath and difficulty breathing.   Cardiovascular: Negative for chest pain, palpitations, hypertension and swelling in legs/feet.  Gastrointestinal: Negative for blood in stool, constipation and diarrhea.  Endocrine: Negative for increased urination.  Genitourinary: Negative for painful urination.  Musculoskeletal: Positive for arthralgias, joint pain and joint swelling. Negative for myalgias, muscle weakness, morning stiffness, muscle tenderness and myalgias.  Skin: Positive for color change. Negative for pallor, rash, hair loss, nodules/bumps, redness,  skin tightness, ulcers and sensitivity to sunlight.  Neurological: Negative for dizziness, numbness and headaches.  Hematological: Negative for swollen glands.  Psychiatric/Behavioral: Negative for depressed mood and sleep disturbance. The patient is not nervous/anxious.     PMFS History:  Patient Active Problem List   Diagnosis Date Noted  . Rheumatoid arthritis involving multiple sites with positive rheumatoid factor ([place]) 12/03/2017  . High risk medication use 12/03/2017  . Raynaud's disease without gangrene 12/03/2017  . Vitamin D deficiency 12/03/2017  . Infertility counseling 07/02/2017  . Fertility testing 05/04/2017    Past Medical History:  Diagnosis Date  . Benign tumor of ovary 2013  . Chlamydia   . Family history of breast cancer   . Raynaud's phenomenon without gangrene    mgmt rheum Dr Annalee Genta  . Rheumatoid arthritis ([place]) 2017  . Wears contact lenses     Family History  Problem Relation Age of Onset  . Colon cancer Maternal Grandfather   . Breast cancer Maternal Grandmother 72  . Breast cancer Other 12  . Breast cancer Cousin 40   Past Surgical History:  Procedure Laterality Date  . LAPAROSCOPIC SALPINGOOPHERECTOMY  2013   done by dr. Kerin Perna , believes right side laterality   . LAPAROSCOPIC UNILATERAL SALPINGECTOMY Right 12/01/2017   Procedure: LAPAROSCOPIC LYSIS OF ADHESIONS, RIGHT TUBAL OVARIOLYSIS, RIGHT OVARIAN CYST ASPIRATION,CHROMOPERTUBATION, RIGHT FIMBRIOPLASTY;  Surgeon: Governor Specking, MD;  Location: [facility];  Service: Gynecology;  Laterality: Right;  . LAPAROSCOPY    . OVARIAN CYST REMOVAL  08/2012   dr Glennon Mac at [facility] in [place] ; has abdominal  insision    Social History   Social History Narrative  . Not on file     Objective: Vital Signs: BP 119/77 (BP Location: Left Arm, Patient Position: Sitting, Cuff Size: Normal)   Pulse 75   Resp 15   Ht '5\' 5"'  (1.651 m)   Wt 141 lb (64 kg)   LMP  11/28/2017   BMI 23.46 kg/m    Physical Exam  Constitutional: She is oriented to person, place, and time. She appears well-developed and well-nourished.  HENT:  Head: Normocephalic and atraumatic.  Eyes: Conjunctivae and EOM are normal.  Neck: Normal range of motion.  Cardiovascular: Normal rate, regular rhythm, normal heart sounds and intact distal pulses.  Pulmonary/Chest: Effort normal and breath sounds normal.  Abdominal: Soft. Bowel sounds are normal.  Lymphadenopathy:    She has no cervical adenopathy.  Neurological: She is alert and oriented to person, place, and time.  Skin: Skin is warm and dry. Capillary refill takes less than 2 seconds.  Psychiatric: She has a normal mood and affect. Her behavior is normal.  Nursing note and vitals reviewed.    Musculoskeletal Exam: C-spine thoracic lumbar spine good range of motion.  Shoulder joints and elbow joints were in good range of motion.  She has limited range of motion of her right wrist joint.  She is synovitis over her MCPs as described below.  She also had some discomfort in her left knee joint. CDAI Exam: CDAI Homunculus Exam:   Tenderness:  Right hand: 1st MCP, 2nd MCP and 3rd MCP Left hand: 1st MCP and 2nd MCP LLE: tibiofemoral  Swelling:  Right hand: 1st MCP, 2nd MCP and 3rd MCP Left hand: 1st MCP and 2nd MCP  Joint Counts:  CDAI Tender Joint count: 6 CDAI Swollen Joint count: 5  Global Assessments:  Patient Global Assessment: 3 Provider Global Assessment: 6  CDAI Calculated Score: 20    Investigation: No additional findings. CBC Latest Ref Rng & Units 12/01/2017 11/22/2017  WBC 3.8 - 10.8 Thousand/uL - 4.4  Hemoglobin 12.0 - 15.0 g/dL 12.0 12.0  Hematocrit 35.0 - 45.0 % - 34.8(L)  Platelets 140 - 400 Thousand/uL - 301   CMP     Component Value Date/Time   NA 139 11/22/2017 1117   K 4.4 11/22/2017 1117   CL 105 11/22/2017 1117   CO2 28 11/22/2017 1117   GLUCOSE 81 11/22/2017 1117   BUN 8  11/22/2017 1117   CREATININE 0.65 11/22/2017 1117   CALCIUM 9.5 11/22/2017 1117   PROT 6.8 11/22/2017 1117   PROT 7.0 11/22/2017 1117   AST 10 11/22/2017 1117   ALT 9 11/22/2017 1117   BILITOT 0.3 11/22/2017 1117   GFRNONAA 117 11/22/2017 1117   GFRAA 136 11/22/2017 1117  SPEP negative, immunoglobulins normal, TB Gold negative, HIV negative, CK normal, TSH normal, UA negative, vitamin D 10, ESR 28 RF 18, anti-CCP 248, ANA 1: 40 homogeneous, urine pregnancy test negative  Imaging: Xr Foot 2 Views Left  Result Date: 11/22/2017 First MTP, PIP and DIP narrowing was noted.  Erosion was noted in the fifth MTP joint and first PIP joint.  No significant intertarsal joint space narrowing was noted.  No tibiotalar joint space narrowing was noted. Impression: These findings are consistent with rheumatoid arthritis of the foot.  Xr Foot 2 Views Right  Result Date: 11/22/2017 First MTP narrowing and valgus deformity was noted.  PIP and DIP narrowing was noted.  No intertarsal joint space narrowing was noted.  No erosive changes were noted.  No comparison films are available. Impression: These findings are consistent with osteoarthritis and rheumatoid arthritis overlap.  Xr Hand 2 View Left  Result Date: 11/22/2017 First second no intercarpal or radiocarpal joint space narrowing was noted.  Juxta-articular osteopenia was noted.  Erosion noted in the first MCP joint. Impression: These findings are consistent with rheumatoid arthritis.  Xr Hand 2 View Right  Result Date: 11/22/2017 First third MCP joints narrowing were noted.  Juxta-articular osteopenia was noted.  Possible erosive changes were noted in the carpal bones and ulnar styloid.  No significant intercarpal or radiocarpal joint space narrowing was noted.  No comparison films are available. Impression: These findings are consistent with rheumatoid arthritis.  Xr Knee 3 View Left  Result Date: 11/22/2017 Mild to moderate medial compartment  narrowing was noted.  Moderate patellofemoral narrowing was noted.  No chondrocalcinosis was noted. Impression: Moderate osteoarthritis and moderate chondromalacia patella.  Xr Knee 3 View Right  Result Date: 11/22/2017 Medial lateral compartment narrowing was noted.  Moderate patellofemoral narrowing was noted.  No chondrocalcinosis was noted. Impression: These findings are consistent with mild chondromalacia patella.   Speciality Comments: No specialty comments available.    Procedures:  No procedures performed Allergies: Patient has no known allergies.   Assessment / Plan:     Visit Diagnoses: Rheumatoid arthritis involving multiple sites with positive rheumatoid factor (HCC) - Positive RF and positive anti-CCP , erosive changes in hands and feet.  Patient continues to have synovitis in her hands.  She also has some discomfort in her knee joint.  She has been taking Plaquenil which is not very effective so far.  She is been treated with methotrexate in the past.  With erosive nature of her arthritis and significant synovitis we discussed the use of Cimzia.  As she is planning pregnancy Cimzia will be the best choice.  Indication side effects contraindications were discussed.  She wants to proceed with Cimzia.  Medication counseling:  TB Test: November 22, 2017 Hepatitis panel: February 2018 HIV: November 22, 2017 SPEP: November 22, 2017 Immunoglobulins: November 22, 2017  Chest x-ray: pending  Does patient have diagnosis of heart failure?  No  Counseled patient that Cimzia is a TNF blocking agent.  Reviewed Cimzia dose of 400 mg subcu at 0, 2 and 4 weeks and then 200 mg subcu every 2 weeks.  Counseled patient on purpose, proper use, and adverse effects of Cimzia.  Reviewed the most common adverse effects including infections, headache, and injection site reactions. Discussed that there is the possibility of an increased risk of malignancy but it is not well understood if this  increased risk is due to the medication or the disease state.  Advised patient to get yearly dermatology exams due to risk of skin cancer.  Reviewed the importance of regular labs while on Cimzia therapy.  Advised patient to get standing labs one month after starting Cimzia.  Provided patient with standing lab orders.  Counseled patient that Cimzia should be held prior to scheduled surgery.  Counseled patient to avoid live vaccines while on Cimzia.  Advised patient to get annual influenza vaccine and the pneumococcal vaccine as indicated.  Provided patient with medication education material and answered all questions.  Patient voiced understanding.  Patient consented to Cimzia.  Will upload consent into the media tab.  Reviewed storage instructions for Cimzia.  Will apply for Cimzia through patient's insurance.  Advised initial injection must be administered in office.  High risk medication use - Treated with methotrexate in the past discontinued prior to pregnancy.  She is on Plaquenil 200 mg po bid M-F. I discussed Cimzia during the last visit.  Raynaud's disease without gangrene  Vitamin D deficiency she is on supplement.   Orders: Orders Placed This Encounter  Procedures  . DG Chest 2 View   No orders of the defined types were placed in this encounter.   Face-to-face time spent with patient was 30 minutes.  Greater than 50% of time was spent in counseling and coordination of care.  Follow-Up Instructions: Return in about 3 months (around 03/15/2018) for Rheumatoid arthritis.   Bo Merino, MD  Note - This record has been created using Editor, commissioning.  Chart creation errors have been sought, but may not always  have been located. Such creation errors do not reflect on  the standard of medical care.

## 2017-12-06 LAB — POCT HEMOGLOBIN-HEMACUE: Hemoglobin: 12 g/dL (ref 12.0–15.0)

## 2017-12-13 ENCOUNTER — Encounter: Payer: Self-pay | Admitting: Rheumatology

## 2017-12-13 ENCOUNTER — Telehealth: Payer: Self-pay

## 2017-12-13 ENCOUNTER — Ambulatory Visit: Payer: BLUE CROSS/BLUE SHIELD | Admitting: Rheumatology

## 2017-12-13 VITALS — BP 119/77 | HR 75 | Resp 15 | Ht 65.0 in | Wt 141.0 lb

## 2017-12-13 DIAGNOSIS — I7300 Raynaud's syndrome without gangrene: Secondary | ICD-10-CM

## 2017-12-13 DIAGNOSIS — M0579 Rheumatoid arthritis with rheumatoid factor of multiple sites without organ or systems involvement: Secondary | ICD-10-CM

## 2017-12-13 DIAGNOSIS — Z79899 Other long term (current) drug therapy: Secondary | ICD-10-CM

## 2017-12-13 DIAGNOSIS — E559 Vitamin D deficiency, unspecified: Secondary | ICD-10-CM

## 2017-12-13 NOTE — Patient Instructions (Signed)
Standing Labs We placed an order today for your standing lab work.    Please come back and get your standing labs in 1 month after Cimzia injection and then every 3 months  We have open lab Monday through Friday from 8:30-11:30 AM and 1:30-4:00 PM  at the office of Dr. Bo Merino.   You may experience shorter wait times on Monday and Friday afternoons. The office is located at [address] [place], [place], [place] [address] No appointment is necessary.   Labs are drawn by [facility].  You may receive a bill from [facility] for your lab work. If you have any questions regarding directions or hours of operation,  please call [phone].      Certolizumab pegol injection What is this medicine? CERTOLIZUMAB (SER toe LIZ oo mab) is used to treat Crohn's disease, psoriatic arthritis, or rheumatoid arthritis. This medicine may be used for other purposes; ask your health care provider or pharmacist if you have questions. COMMON BRAND NAME(S): Cimzia What should I tell my health care provider before I take this medicine? They need to know if you have any of these conditions: -diabetes -heart disease -hepatitis B or history of hepatitis B infection -immune system problems -infection or history of infections -low blood counts, like low white cell, platelet, or red cell counts -multiple sclerosis -recently received or scheduled to receive a vaccine -scheduled to have surgery -tuberculosis, a positive skin test for tuberculosis or have recently been in close contact with someone who has tuberculosis -an unusual or allergic reaction to certolizumab, other medicines, foods, dyes, or preservatives -pregnant or trying to get pregnant -breast-feeding How should I use this medicine? This medicine is for injection under the skin. It is usually given by a health care professional in a hospital or clinic setting. If you get this medicine at home, you will be taught how to prepare and give this  medicine. Use exactly as directed. Take your medicine at regular intervals. Do not take your medicine more often than directed. It is important that you put your used needles and syringes in a special sharps container. Do not put them in a trash can. If you do not have a sharps container, call your pharmacist or healthcare provider to get one. A special MedGuide will be given to you by the pharmacist with each prescription and refill. Be sure to read this information carefully each time. Talk to your pediatrician regarding the use of this medicine in children. Special care may be needed. Overdosage: If you think you have taken too much of this medicine contact a poison control center or emergency room at once. NOTE: This medicine is only for you. Do not share this medicine with others. What if I miss a dose? It is important not to miss your dose. Call your doctor or health care professional if you are unable to keep an appointment. If you give yourself the medicine and you miss a dose, take it as soon as you can. If it is almost time for your next dose, take only that dose. Do not take double or extra doses. What may interact with this medicine? Do not take this medicine with any of the following medications: -abatacept -adalimumab -anakinra -etanercept -infliximab -live virus vaccines -rilonacept This medicine may also interact with the following medications: -vaccines This list may not describe all possible interactions. Give your health care provider a list of all the medicines, herbs, non-prescription drugs, or dietary supplements you use. Also tell them if  you smoke, drink alcohol, or use illegal drugs. Some items may interact with your medicine. What should I watch for while using this medicine? Visit your doctor or health care professional for regular checks on your progress. Tell your doctor or healthcare professional if your symptoms do not start to get better or if they get worse. Your  condition will be monitored carefully while you are receiving this medicine. You will be tested for tuberculosis (TB) before you start this medicine. If your doctor prescribes any medicine for TB, you should start taking the TB medicine before starting this medicine. Make sure to finish the full course of TB medicine. Call your doctor or health care professional for advice if you get a fever, chills, sore throat, or other symptoms of an infection. Do not treat yourself. This medicine may decrease your body's ability to fight infection. Try to avoid being around people who are sick. Talk to your doctor about your risk of cancer. You may be more at risk for certain types of cancers if you take this medicine. What side effects may I notice from receiving this medicine? Side effects that you should report to your doctor or health care professional as soon as possible: -allergic reactions like skin rash, itching or hives, swelling of the face, lips, or tongue -breathing problems -changes in vision -chest pain or palpitations -fever or chills, sore throat -pain, tingling, numbness in the hands or feet -red, scaly patches or raised bumps on the skin -seizures -swelling of the ankles, feet, hands -swollen lymph nodes in the neck, underarm, or groin areas -unexplained weight loss -unusual bleeding or bruising -unusually weak or tired Side effects that usually do not require medical attention (report to your doctor or health care professional if they continue or are bothersome): -irritation at site where injected This list may not describe all possible side effects. Call your doctor for medical advice about side effects. You may report side effects to FDA at 1-800-FDA-1088. Where should I keep my medicine? Keep out of the reach of children. If you are using this medicine at home, you will be instructed on how to store this medicine. Throw away any unused medicine after the expiration date on the  label. NOTE: This sheet is a summary. It may not cover all possible information. If you have questions about this medicine, talk to your doctor, pharmacist, or health care provider.  2018 Elsevier/Gold Standard (2012-06-29 10:31:30)

## 2017-12-13 NOTE — Telephone Encounter (Signed)
Submitted a prior authorization for Cimiza to pts insurance via cover my meds. Will update once we receive a response.   Geniece Akers, [place], CPhT 2:25 PM

## 2017-12-13 NOTE — Telephone Encounter (Signed)
-----   Message from Carole Binning, LPN sent at 2/52/7129 10:28 AM EDT ----- Regarding: Please apply for Cimzia Please apply for Cimzia.  Cimzia 200 mg SQ Loading dose Weeks 0, 2 4 then 200 mg every other week.  Patient is trying to conceive.

## 2017-12-16 NOTE — Telephone Encounter (Signed)
Received a fax from [facility] regarding a prior authorization approval for [place] from 12/13/2017 to 09/27/2038.   Reference number: [id number] Phone number: [phone]  Will send document to scan center.  Called pt to update. We will have her Rx sent to the specialty pharmacy with [facility]. Her first refill will come to the office. Once we receive he Rx we will schedule her nursing visit for administration. Gave her the information for the Cimzia co-pay card. She was driving and will call back to get the phon number. Patient voices understanding and denies any questions at this time.   Please send Rx to a [facility] and have first Rx shipped to the clinic. Thanks!  Nevan Creighton, [place], CPhT 11:22 AM

## 2017-12-17 ENCOUNTER — Telehealth: Payer: Self-pay | Admitting: Rheumatology

## 2017-12-17 NOTE — Telephone Encounter (Signed)
Returned pts call. Left message for pt so call back.   Tjuana Vickrey, [place], CPhT 11:14 AM

## 2017-12-17 NOTE — Telephone Encounter (Signed)
Patient left a voicemail stating that she was returning a call to the office regarding her Cimzia.

## 2017-12-20 MED ORDER — CERTOLIZUMAB PEGOL 6 X 200 MG/ML ~~LOC~~ KIT: PACK | SUBCUTANEOUS | 0 refills | Status: DC

## 2017-12-20 NOTE — Telephone Encounter (Signed)
Prescription has been sent to the pharmacy. Patient advised.

## 2017-12-20 NOTE — Addendum Note (Signed)
Addended by: Carole Binning on: 12/20/2017 01:18 PM   Modules accepted: Orders

## 2017-12-21 NOTE — Telephone Encounter (Signed)
Received a PA request/rejection for cimiza via cover my meds from [employer]. Patient has authorization on file for starter kit.   Called insurance to verify. Spoke with Jacquelynn Cree B who states that pts authorization on file is still active and approved for the starter kit. It appears that the Rx was being filled by a retail pharmacy and will require a specialty pharmacy.   Called [facility] to check status. Spoke with associate who states that the initial Rx was sent to the retail side of the pharmacy and received a rejection. The Rx will be transferred to the specialty side for processing. Spoke with associate at specialty pharmacy who states they will transfer the Rx to the specialty side and process Rx. They will contact us if any additional information is required.   Phone: [phone]  Called pt to update. [facility] will transfer Rx and process it. The first fill will be shipped to the clinic. Once received, we will contact pt to schedule nursing visit. Patient voices understanding and denies any questions at this time.  Bryli Mantey, [place], CPhT .nw

## 2017-12-23 ENCOUNTER — Telehealth: Payer: Self-pay | Admitting: *Deleted

## 2017-12-23 NOTE — Telephone Encounter (Signed)
Spoke with Katrina and delivery for Cimzia has been scheduled for 12/29/17.

## 2017-12-29 NOTE — Telephone Encounter (Signed)
Patient has been scheduled for 01/04/18 at 10 am.

## 2017-12-29 NOTE — Telephone Encounter (Signed)
Received Cimzia from [facility] for. Medication has been placed in the refrigerator. Please schedule nursing visit. Thanks!  Patriciann Becht, [place], CPhT 1:31 PM

## 2018-01-04 ENCOUNTER — Ambulatory Visit (HOSPITAL_COMMUNITY)
Admission: RE | Admit: 2018-01-04 | Discharge: 2018-01-04 | Disposition: A | Discharge status: Home / Self Care | Payer: BLUE CROSS/BLUE SHIELD | Source: Ambulatory Visit | Attending: Rheumatology | Admitting: Rheumatology

## 2018-01-04 ENCOUNTER — Ambulatory Visit: Payer: BLUE CROSS/BLUE SHIELD | Admitting: *Deleted

## 2018-01-04 VITALS — BP 130/88 | HR 65

## 2018-01-04 DIAGNOSIS — M069 Rheumatoid arthritis, unspecified: Secondary | ICD-10-CM | POA: Diagnosis not present

## 2018-01-04 DIAGNOSIS — Z79899 Other long term (current) drug therapy: Secondary | ICD-10-CM | POA: Diagnosis present

## 2018-01-04 DIAGNOSIS — M0579 Rheumatoid arthritis with rheumatoid factor of multiple sites without organ or systems involvement: Secondary | ICD-10-CM | POA: Diagnosis not present

## 2018-01-04 MED ORDER — CERTOLIZUMAB PEGOL 2 X 200 MG/ML ~~LOC~~ KIT
200.0000 mg | PACK | Freq: Once | SUBCUTANEOUS | Status: AC
  Administered 2018-01-04: 200 mg via SUBCUTANEOUS

## 2018-01-04 NOTE — Patient Instructions (Signed)
Standing Labs We placed an order today for your standing lab work.    Please come back and get your standing labs in 1 month and every 3 months  We have open lab Monday through Friday from 8:30-11:30 AM and 1:30-4:00 PM  at the office of Dr. Shaili Deveshwar.   You may experience shorter wait times on Monday and Friday afternoons. The office is located at [address], [address], [place], [place] [address] No appointment is necessary.   Labs are drawn by [facility].  You may receive a bill from [facility] for your lab work. If you have any questions regarding directions or hours of operation,  please call [phone].     

## 2018-01-04 NOTE — Progress Notes (Signed)
No active disease on the chest x-ray

## 2018-01-04 NOTE — Progress Notes (Signed)
Patient in office for a new start to [facility]. Patient was given a demonstration to proper technique of self administration. Patient was able to demonstrate proper technique. Patient provided medication and was given injection in her right lower abdomen. She tolerated injection well. Patient was monitored in office for 30 minutes after administration for adverse reactions. No adverse reaction noted.   Administrations This Visit    Certolizumab Pegol KIT 200 mg    Admin Date 01/04/2018 Action Given Dose 200 mg Route Subcutaneous Administered By Carole Binning, LPN

## 2018-01-21 ENCOUNTER — Telehealth: Payer: Self-pay | Admitting: Rheumatology

## 2018-01-21 NOTE — Telephone Encounter (Signed)
Needs RX for Cymsia maintance dose sent over. Fax# [phone]

## 2018-01-21 NOTE — Telephone Encounter (Signed)
Patient advised we will need to have labs before refill can be sent into pharmacy. Patient does not refill sent in at this time.

## 2018-01-24 ENCOUNTER — Ambulatory Visit: Payer: BLUE CROSS/BLUE SHIELD | Admitting: Physician Assistant

## 2018-02-02 ENCOUNTER — Other Ambulatory Visit: Payer: Self-pay

## 2018-02-02 ENCOUNTER — Telehealth: Payer: Self-pay | Admitting: Rheumatology

## 2018-02-02 DIAGNOSIS — Z79899 Other long term (current) drug therapy: Secondary | ICD-10-CM

## 2018-02-02 DIAGNOSIS — E559 Vitamin D deficiency, unspecified: Secondary | ICD-10-CM

## 2018-02-02 NOTE — Telephone Encounter (Signed)
Last Visit: 12/13/17 Next Visit: 03/14/18 Labs: 11/22/17 cbc/cmp wnl PLQ eye exam: patient has not had this done.  Patient states she does not have vision insurance and is not sure when she will get it. Therefore patient is not going to schedule an eye appointment. Per Dr. Estanislado Pandy she states it okay for patient to discontinue PLQ and contact the office if she flares to discuss other options. Patient advised and verbalized understanding.

## 2018-02-02 NOTE — Telephone Encounter (Signed)
Patient is almost out of Plaquenil. Dr. Estanislado Pandy is continuing her on RX. Patient requests a new rx to be sent [facility] on [address] in [place].

## 2018-02-03 LAB — CBC WITH DIFFERENTIAL/PLATELET
Basophils Absolute: 51 cells/uL (ref 0–200)
Basophils Relative: 1.7 %
Eosinophils Absolute: 270 cells/uL (ref 15–500)
Eosinophils Relative: 9 %
HCT: 37.6 % (ref 35.0–45.0)
Hemoglobin: 12.6 g/dL (ref 11.7–15.5)
Lymphs Abs: 1344 cells/uL (ref 850–3900)
MCH: 29.3 pg (ref 27.0–33.0)
MCHC: 33.5 g/dL (ref 32.0–36.0)
MCV: 87.4 fL (ref 80.0–100.0)
MPV: 9.2 fL (ref 7.5–12.5)
Monocytes Relative: 9.7 %
Neutro Abs: 1044 cells/uL — ABNORMAL LOW (ref 1500–7800)
Neutrophils Relative %: 34.8 %
Platelets: 255 10*3/uL (ref 140–400)
RBC: 4.3 10*6/uL (ref 3.80–5.10)
RDW: 11.8 % (ref 11.0–15.0)
Total Lymphocyte: 44.8 %
WBC mixed population: 291 cells/uL (ref 200–950)
WBC: 3 10*3/uL — ABNORMAL LOW (ref 3.8–10.8)

## 2018-02-03 LAB — COMPLETE METABOLIC PANEL WITH GFR
AG Ratio: 1.8 (calc) (ref 1.0–2.5)
ALT: 8 U/L (ref 6–29)
AST: 14 U/L (ref 10–30)
Albumin: 4.6 g/dL (ref 3.6–5.1)
Alkaline phosphatase (APISO): 49 U/L (ref 33–115)
BUN: 11 mg/dL (ref 7–25)
CO2: 29 mmol/L (ref 20–32)
Calcium: 9.5 mg/dL (ref 8.6–10.2)
Chloride: 103 mmol/L (ref 98–110)
Creat: 0.74 mg/dL (ref 0.50–1.10)
GFR, Est African American: 123 mL/min/{1.73_m2} (ref >=60)
GFR, Est Non African American: 106 mL/min/{1.73_m2} (ref >=60)
Globulin: 2.6 g/dL (calc) (ref 1.9–3.7)
Glucose, Bld: 80 mg/dL (ref 65–99)
Potassium: 4.2 mmol/L (ref 3.5–5.3)
Sodium: 140 mmol/L (ref 135–146)
Total Bilirubin: 0.4 mg/dL (ref 0.2–1.2)
Total Protein: 7.2 g/dL (ref 6.1–8.1)

## 2018-02-03 LAB — VITAMIN D 25 HYDROXY (VIT D DEFICIENCY, FRACTURES): Vit D, 25-Hydroxy: 18 ng/mL — ABNORMAL LOW (ref 30–100)

## 2018-02-03 NOTE — Progress Notes (Signed)
Call in vitamin D 50,000 units twice a week for 3 months.  Repeat vitamin D in 3 months.  WBC is low we will continue to monitor.

## 2018-02-08 ENCOUNTER — Telehealth: Payer: Self-pay | Admitting: *Deleted

## 2018-02-08 DIAGNOSIS — E559 Vitamin D deficiency, unspecified: Secondary | ICD-10-CM

## 2018-02-08 MED ORDER — VITAMIN D (ERGOCALCIFEROL) 1.25 MG (50000 UNIT) PO CAPS: 50000.0000 [IU] | ORAL_CAPSULE | ORAL | 0 refills | Status: DC

## 2018-02-08 NOTE — Telephone Encounter (Signed)
-----   Message from Bo Merino, MD sent at 02/03/2018  1:24 PM EDT ----- Call in vitamin D 50,000 units twice a week for 3 months.  Repeat vitamin D in 3 months.  WBC is low we will continue to monitor.

## 2018-02-28 NOTE — Progress Notes (Signed)
Office Visit Note  Patient: Meghan Arnold             Date of Birth: 1986/04/24           MRN: [id number]             PCP: Patient, No Pcp Per Referring: No ref. provider found Visit Date: 03/14/2018 Occupation: '@GUAROCC' @    Subjective:  Medication management.   History of Present Illness: Meghan Arnold is a 32 y.o. female with history of seropositive rheumatoid arthritis.  She has intermittent pain and swelling in her hands.  She started working out recently and also noticed some discomfort in her knee joints.  Activities of Daily Living:  Patient reports morning stiffness for 0 minutes.   Patient Reports nocturnal pain.  Difficulty dressing/grooming: Denies Difficulty climbing stairs: Denies Difficulty getting out of chair: Denies Difficulty using hands for taps, buttons, cutlery, and/or writing: Denies   Review of Systems  Constitutional: Negative for fatigue.  HENT: Positive for mouth dryness. Negative for mouth sores.   Eyes: Negative for dryness.  Respiratory: Negative for shortness of breath and difficulty breathing.   Cardiovascular: Negative for chest pain and swelling in legs/feet.  Gastrointestinal: Negative for abdominal pain, constipation and diarrhea.  Endocrine: Negative for increased urination.  Genitourinary: Negative for pelvic pain.  Musculoskeletal: Positive for arthralgias, joint pain and joint swelling.  Skin: Negative for rash.  Allergic/Immunologic: Negative for susceptible to infections.  Neurological: Positive for headaches. Negative for dizziness, light-headedness, memory loss and weakness.  Hematological: Positive for bruising/bleeding tendency.  Psychiatric/Behavioral: Negative for confusion.    PMFS History:  Patient Active Problem List   Diagnosis Date Noted  . Rheumatoid arthritis involving multiple sites with positive rheumatoid factor ([place]) 12/03/2017  . High risk medication use 12/03/2017  . Raynaud's disease without gangrene  12/03/2017  . Vitamin D deficiency 12/03/2017  . Infertility counseling 07/02/2017  . Fertility testing 05/04/2017    Past Medical History:  Diagnosis Date  . Benign tumor of ovary 2013  . Chlamydia   . Family history of breast cancer   . Raynaud's phenomenon without gangrene    mgmt rheum Dr Annalee Genta  . Rheumatoid arthritis ([place]) 2017  . Wears contact lenses     Family History  Problem Relation Age of Onset  . Colon cancer Maternal Grandfather   . Breast cancer Maternal Grandmother 72  . Breast cancer Other 17  . Breast cancer Cousin 40   Past Surgical History:  Procedure Laterality Date  . LAPAROSCOPIC SALPINGOOPHERECTOMY  2013   done by dr. Kerin Perna , believes right side laterality   . LAPAROSCOPIC UNILATERAL SALPINGECTOMY Right 12/01/2017   Procedure: LAPAROSCOPIC LYSIS OF ADHESIONS, RIGHT TUBAL OVARIOLYSIS, RIGHT OVARIAN CYST ASPIRATION,CHROMOPERTUBATION, RIGHT FIMBRIOPLASTY;  Surgeon: Governor Specking, MD;  Location: [facility];  Service: Gynecology;  Laterality: Right;  . LAPAROSCOPY    . OVARIAN CYST REMOVAL  08/2012   dr Glennon Mac at [facility] obgyn in [place] ; has abdominal insision    Social History   Social History Narrative  . Not on file     Objective: Vital Signs: BP 124/76 (BP Location: Left Arm, Patient Position: Sitting, Cuff Size: Normal)   Pulse 68   Resp 15   Ht '5\' 5"'  (1.651 m)   Wt 146 lb (66.2 kg)   BMI 24.30 kg/m    Physical Exam  Constitutional: She is oriented to person, place, and time. She appears well-developed and well-nourished.  HENT:  Head: Normocephalic  and atraumatic.  Eyes: Conjunctivae and EOM are normal.  Neck: Normal range of motion.  Cardiovascular: Normal rate, regular rhythm, normal heart sounds and intact distal pulses.  Pulmonary/Chest: Effort normal and breath sounds normal.  Abdominal: Soft. Bowel sounds are normal.  Lymphadenopathy:    She has no cervical adenopathy.  Neurological: She is  alert and oriented to person, place, and time.  Skin: Skin is warm and dry. Capillary refill takes less than 2 seconds.  Psychiatric: She has a normal mood and affect. Her behavior is normal.  Nursing note and vitals reviewed.    Musculoskeletal Exam: C-spine thoracic lumbar spine good range of motion.  Shoulder joints elbow joints wrist joint MCPs PIPs DIPs but good range of motion.  She has some synovitis over MCPs and PIPs as described below.  Hip joints knee joints ankles MTPs PIPs were in good range of motion with no synovitis.  CDAI Exam: CDAI Homunculus Exam:   Tenderness:  Right hand: 1st MCP and 4th PIP  Swelling:  Right hand: 1st MCP and 4th PIP  Joint Counts:  CDAI Tender Joint count: 2 CDAI Swollen Joint count: 2  Global Assessments:  Patient Global Assessment: 3 Provider Global Assessment: 3  CDAI Calculated Score: 10    Investigation: No additional findings. CBC Latest Ref Rng & Units 02/02/2018 12/01/2017 11/22/2017  WBC 3.8 - 10.8 Thousand/uL 3.0(L) - 4.4  Hemoglobin 11.7 - 15.5 g/dL 12.6 12.0 12.0  Hematocrit 35.0 - 45.0 % 37.6 - 34.8(L)  Platelets 140 - 400 Thousand/uL 255 - 301   CMP Latest Ref Rng & Units 02/02/2018 11/22/2017 11/22/2017  Glucose 65 - 99 mg/dL 80 - 81  BUN 7 - 25 mg/dL 11 - 8  Creatinine 0.50 - 1.10 mg/dL 0.74 - 0.65  Sodium 135 - 146 mmol/L 140 - 139  Potassium 3.5 - 5.3 mmol/L 4.2 - 4.4  Chloride 98 - 110 mmol/L 103 - 105  CO2 20 - 32 mmol/L 29 - 28  Calcium 8.6 - 10.2 mg/dL 9.5 - 9.5  Total Protein 6.1 - 8.1 g/dL 7.2 7.0 6.8  Total Bilirubin 0.2 - 1.2 mg/dL 0.4 - 0.3  AST 10 - 30 U/L 14 - 10  ALT 6 - 29 U/L 8 - 9     Imaging: No results found.  Speciality Comments: No specialty comments available.    Procedures:  No procedures performed Allergies: Patient has no known allergies.   Assessment / Plan:     Visit Diagnoses: Rheumatoid arthritis involving multiple sites with positive rheumatoid factor (HCC) -  Positive RF and  positive anti-CCP , erosive changes in hands and feet.  She is doing better on Cimzia.  She could not get Plaquenil as she does not have insurance coverage for the eye exam.  I discussed the option of doing sulfasalazine.  She was in agreement.  Handout was given and consent was taken.  I will obtain G6PD today.  If G6PD is normal we will call in prescription for Azulfidine EN 500 mg 2 tablets p.o. twice daily total 30-day supply with 2 refills.  She will need labs in 1 month and then every 3 months to monitor for drug toxicity.  Medication counseling:  G6PD: Pending Patient was counseled on the purpose, proper use, and adverse effects of sulfasalazine including risk of infection and chance of nausea, headache, and sun sensitivity.  Also discussed risk of skin rash and advised patient to stop the medication and let us know if she develops a  rash. Also discussed for the potential of discoloration of the urine, sweat, or tears.  Reviewed the importance of frequent labs to monitor liver, kidneys, and blood counts; and provided patient with standing lab instructions.  Provided patient with educational materials on sulfasalazine and answered all questions.     High risk medication use - Cimzia, Treated with methotrexate in the past discontinued prior to pregnancy.   - Plan: Glucose 6 phosphate dehydrogenase  Raynaud's disease without gangrene-she is a smoker  Vitamin D deficiency -on supplement  Planning pregnancy   Orders: Orders Placed This Encounter  Procedures  . Glucose 6 phosphate dehydrogenase   Meds ordered this encounter  Medications  . Certolizumab Pegol 2 X 200 MG/ML KIT    Sig: Inject 200 mg into the skin every 14 (fourteen) days.    Dispense:  3 kit    Refill:  0  . folic acid (FOLVITE) 1 MG tablet    Sig: Take 1 tablet (1 mg total) by mouth daily.    Dispense:  90 tablet    Refill:  2  . BD SHARPS CONTAINER HOME MISC    Sig: Use to dispose of syringes.    Dispense:  1 each      Refill:  0      Follow-Up Instructions: Return in about 5 months (around 08/14/2018) for Rheumatoid arthritis.   Bo Merino, MD  Note - This record has been created using Editor, commissioning.  Chart creation errors have been sought, but may not always  have been located. Such creation errors do not reflect on  the standard of medical care.

## 2018-03-04 ENCOUNTER — Other Ambulatory Visit: Payer: Self-pay | Admitting: *Deleted

## 2018-03-04 MED ORDER — CERTOLIZUMAB PEGOL 2 X 200 MG/ML ~~LOC~~ KIT: 200.0000 mg | PACK | SUBCUTANEOUS | 0 refills | Status: DC

## 2018-03-04 NOTE — Telephone Encounter (Signed)
Refill request received via fax  Last Visit: 12/13/17 Next Visit: 03/14/18 Labs: 02/12/18 WBC decreased will monitor  TB gold: 11/22/17 Neg   Okay to refill per Dr. Estanislado Pandy

## 2018-03-14 ENCOUNTER — Ambulatory Visit: Payer: BLUE CROSS/BLUE SHIELD | Admitting: Rheumatology

## 2018-03-14 ENCOUNTER — Encounter: Payer: Self-pay | Admitting: Rheumatology

## 2018-03-14 VITALS — BP 124/76 | HR 68 | Resp 15 | Ht 65.0 in | Wt 146.0 lb

## 2018-03-14 DIAGNOSIS — I7300 Raynaud's syndrome without gangrene: Secondary | ICD-10-CM | POA: Diagnosis not present

## 2018-03-14 DIAGNOSIS — Z79899 Other long term (current) drug therapy: Secondary | ICD-10-CM | POA: Diagnosis not present

## 2018-03-14 DIAGNOSIS — E559 Vitamin D deficiency, unspecified: Secondary | ICD-10-CM | POA: Diagnosis not present

## 2018-03-14 DIAGNOSIS — M0579 Rheumatoid arthritis with rheumatoid factor of multiple sites without organ or systems involvement: Secondary | ICD-10-CM

## 2018-03-14 MED ORDER — FOLIC ACID 1 MG PO TABS: 1.0000 mg | ORAL_TABLET | Freq: Every day | ORAL | 2 refills | Status: DC

## 2018-03-14 MED ORDER — CERTOLIZUMAB PEGOL 2 X 200 MG/ML ~~LOC~~ KIT
200.0000 mg | PACK | SUBCUTANEOUS | 0 refills | Status: DC
Start: 2018-03-14 — End: 2018-10-17

## 2018-03-14 MED ORDER — BD SHARPS CONTAINER HOME MISC: 0 refills | Status: DC

## 2018-03-14 NOTE — Patient Instructions (Signed)
Sulfasalazine delayed release tablets What is this medicine? SULFASALAZINE (sul fa SAL a zeen) is for ulcerative colitis and certain types of rheumatoid arthritis. This medicine may be used for other purposes; ask your health care provider or pharmacist if you have questions. COMMON BRAND NAME(S): Azulfidine En-Tabs, Sulfazine EC What should I tell my health care provider before I take this medicine? They need to know if you have any of these conditions: -asthma -blood disorders or anemia -glucose-6-phosphate dehydrogenase (G6PD) deficiency -intestinal obstruction -kidney disease -liver disease -porphyria -urinary tract obstruction -an unusual reaction to sulfasalazine, sulfa drugs, salicylates, other medicines, foods, dyes, or preservatives -pregnant or trying to get pregnant -breast-feeding How should I use this medicine? Take this medicine by mouth with a full glass of water. Follow the directions on the prescription label. If the medicine upsets your stomach, take it with food or milk. Do not crush or chew the tablets. Swallow the tablets whole. Take your medicine at regular intervals. Do not take your medicine more often than directed. Do not stop taking except on your doctor's advice. Talk to your pediatrician regarding the use of this medicine in children. Special care may be needed. While this drug may be prescribed for children as young as 6 years for selected conditions, precautions do apply. Patients over 26 years old may have a stronger reaction and need a smaller dose. Overdosage: If you think you have taken too much of this medicine contact a poison control center or emergency room at once. NOTE: This medicine is only for you. Do not share this medicine with others. What if I miss a dose? If you miss a dose, take it as soon as you can. If it is almost time for your next dose, take only that dose. Do not take double or extra doses. What may interact with this  medicine? -digoxin -folic acid This list may not describe all possible interactions. Give your health care provider a list of all the medicines, herbs, non-prescription drugs, or dietary supplements you use. Also tell them if you smoke, drink alcohol, or use illegal drugs. Some items may interact with your medicine. What should I watch for while using this medicine? Visit your doctor or health care professional for regular checks on your progress. You will need frequent blood and urine checks. This medicine can make you more sensitive to the sun. Keep out of the sun. If you cannot avoid being in the sun, wear protective clothing and use sunscreen. Do not use sun lamps or tanning beds/booths. Drink plenty of water while taking this medicine. Tell your doctor if you see the tablet in your stools. Your body may not be absorbing the medicine. What side effects may I notice from receiving this medicine? Side effects that you should report to your doctor or health care professional as soon as possible: -allergic reactions like skin rash, itching or hives, swelling of the face, lips, or tongue -fever, chills, or any other sign of infection -painful, difficult, or reduced urination -redness, blistering, peeling or loosening of the skin, including inside the mouth -severe stomach pain -unusual bleeding or bruising -unusually weak or tired -yellowing of the skin or eyes Side effects that usually do not require medical attention (report to your doctor or health care professional if they continue or are bothersome): -headache -loss of appetite -nausea, vomiting -orange color to the urine -reduced sperm count This list may not describe all possible side effects. Call your doctor for medical advice about side effects. You  may report side effects to FDA at 1-800-FDA-1088. Where should I keep my medicine? Keep out of the reach of children. Store at room temperature between 15 and 30 degrees C (59 and 86  degrees F). Keep container tightly closed. Throw away any unused medicine after the expiration date. NOTE: This sheet is a summary. It may not cover all possible information. If you have questions about this medicine, talk to your doctor, pharmacist, or health care provider.  2018 Elsevier/Gold Standard (2008-05-18 13:02:26)  Standing Labs We placed an order today for your standing lab work.    Please come back and get your standing labs in 1 month and then every 3 months  We have open lab Monday through Friday from 8:30-11:30 AM and 1:30-4:00 PM  at the office of Dr. Bo Merino.   You may experience shorter wait times on Monday and Friday afternoons. The office is located at [address] [place], [place], [place] [address] No appointment is necessary.   Labs are drawn by [facility].  You may receive a bill from [facility] for your lab work. If you have any questions regarding directions or hours of operation,  please call [phone].

## 2018-03-15 LAB — GLUCOSE 6 PHOSPHATE DEHYDROGENASE: G-6PDH: 14.7 U/g Hgb (ref 7.0–20.5)

## 2018-03-23 ENCOUNTER — Telehealth: Payer: Self-pay | Admitting: Rheumatology

## 2018-03-23 MED ORDER — SULFASALAZINE 500 MG PO TABS: 1000.0000 mg | ORAL_TABLET | Freq: Two times a day (BID) | ORAL | 2 refills | Status: DC

## 2018-03-23 NOTE — Telephone Encounter (Signed)
Patient left a voicemail stating at her last appointment on 03/14/18 they discussed patient taking a medication.  Patient states she checked her pharmacy and nothing has been called in yet.

## 2018-03-23 NOTE — Telephone Encounter (Signed)
G6PD Negative  Per last office note: 500 mg 2 tablets p.o. twice daily total 30-day supply with 2 refills. She will need labs in 1 month and then every 3 months to monitor for drug toxicity.  Prescription sent to pharmacy and patient advised.

## 2018-06-17 NOTE — Telephone Encounter (Signed)
Received notification from [facility] regarding a prior authorization for Cimzia. Authorization has been APPROVED from 06/17/18 to 09/27/2038.   Will send document to scan center.  Authorization # [id number] Phone # [phone]  Beatriz Chancellor, CPhT

## 2018-08-23 ENCOUNTER — Telehealth: Payer: Self-pay | Admitting: *Deleted

## 2018-08-23 NOTE — Telephone Encounter (Signed)
Refill request received via fax  Last visit: 03/14/18 Next Visit due November 2019. Left message for patient to call and schedule follow up visit.  Labs: 02/02/18 WBC is low we will continue to monitor. TB Gold: 11/22/17  Left message to advise patient she is due for labs and a follow up visit.   Okay to refill 30 day supply Cimzia?

## 2018-08-23 NOTE — Telephone Encounter (Signed)
We need labs prior to refill.

## 2018-10-12 NOTE — Progress Notes (Signed)
Office Visit Note  Patient: Meghan Arnold             Date of Birth: November 03, 1985           MRN: [id number]             PCP: Patient, No Pcp Per Referring: No ref. provider found Visit Date: 10/13/2018 Occupation: @GUAROCC @  Subjective:  Pain in wrist joints.    History of Present Illness: Meghan Arnold is a 33 y.o. female with history of seropositive rheumatoid arthritis.  She states she has been having discomfort in her bilateral wrist joints.  The right wrist joint is more painful.  He has been on sulfasalazine which she has been taking on regular basis.  She has been off Cimzia for a month now as she did not have updated labs.  Recently and did not take sulfasalazine with her.  She states at that time she developed some swelling in her both hands.  This happened around December 28.  The symptoms have resolved now.  Activities of Daily Living:  Patient reports morning stiffness for 0 minute.   Patient Denies nocturnal pain.  Difficulty dressing/grooming: Denies Difficulty climbing stairs: Denies Difficulty getting out of chair: Denies Difficulty using hands for taps, buttons, cutlery, and/or writing: Denies  Review of Systems  Constitutional: Negative for fatigue, night sweats, weight gain and weight loss.  HENT: Negative for mouth sores, trouble swallowing, trouble swallowing, mouth dryness and nose dryness.   Eyes: Negative for pain, redness, visual disturbance and dryness.  Respiratory: Negative for cough, shortness of breath and difficulty breathing.   Cardiovascular: Negative for chest pain, palpitations, hypertension, irregular heartbeat and swelling in legs/feet.  Gastrointestinal: Negative for blood in stool, constipation and diarrhea.  Endocrine: Negative for increased urination.  Genitourinary: Negative for vaginal dryness.  Musculoskeletal: Positive for arthralgias and joint pain. Negative for joint swelling, myalgias, muscle weakness, morning stiffness, muscle  tenderness and myalgias.  Skin: Negative for color change, rash, hair loss, skin tightness, ulcers and sensitivity to sunlight.  Allergic/Immunologic: Negative for susceptible to infections.  Neurological: Negative for dizziness, memory loss, night sweats and weakness.  Hematological: Negative for swollen glands.  Psychiatric/Behavioral: Negative for depressed mood and sleep disturbance. The patient is not nervous/anxious.     PMFS History:  Patient Active Problem List   Diagnosis Date Noted  . Rheumatoid arthritis involving multiple sites with positive rheumatoid factor ([place]) 12/03/2017  . High risk medication use 12/03/2017  . Raynaud's disease without gangrene 12/03/2017  . Vitamin D deficiency 12/03/2017  . Infertility counseling 07/02/2017  . Fertility testing 05/04/2017    Past Medical History:  Diagnosis Date  . Benign tumor of ovary 2013  . Chlamydia   . Family history of breast cancer   . Raynaud's phenomenon without gangrene    mgmt rheum Dr Annalee Genta  . Rheumatoid arthritis ([place]) 2017  . Wears contact lenses     Family History  Problem Relation Age of Onset  . Colon cancer Maternal Grandfather   . Breast cancer Maternal Grandmother 72  . Breast cancer Other 20  . Breast cancer Cousin 40   Past Surgical History:  Procedure Laterality Date  . LAPAROSCOPIC SALPINGOOPHERECTOMY  2013   done by dr. Kerin Perna , believes right side laterality   . LAPAROSCOPIC UNILATERAL SALPINGECTOMY Right 12/01/2017   Procedure: LAPAROSCOPIC LYSIS OF ADHESIONS, RIGHT TUBAL OVARIOLYSIS, RIGHT OVARIAN CYST ASPIRATION,CHROMOPERTUBATION, RIGHT FIMBRIOPLASTY;  Surgeon: Governor Specking, MD;  Location: [facility];  Service:  Gynecology;  Laterality: Right;  . LAPAROSCOPY    . OVARIAN CYST REMOVAL  08/2012   dr Glennon Mac at [facility] obgyn in [place] ; has abdominal insision    Social History   Social History Narrative  . Not on file   Immunization History    Administered Date(s) Administered  . Tdap 08/11/2016     Objective: Vital Signs: BP 124/81 (BP Location: Right Arm, Patient Position: Sitting, Cuff Size: Small)   Pulse 69   Resp 12   Ht 5\' 5"  (1.651 m)   Wt 152 lb 3.2 oz (69 kg)   LMP 09/29/2018   BMI 25.33 kg/m    Physical Exam Vitals signs and nursing note reviewed.  Constitutional:      Appearance: She is well-developed.  HENT:     Head: Normocephalic and atraumatic.  Eyes:     Conjunctiva/sclera: Conjunctivae normal.  Neck:     Musculoskeletal: Normal range of motion.  Cardiovascular:     Rate and Rhythm: Normal rate and regular rhythm.     Heart sounds: Normal heart sounds.  Pulmonary:     Effort: Pulmonary effort is normal.     Breath sounds: Normal breath sounds.  Abdominal:     General: Bowel sounds are normal.     Palpations: Abdomen is soft.  Lymphadenopathy:     Cervical: No cervical adenopathy.  Skin:    General: Skin is warm and dry.     Capillary Refill: Capillary refill takes less than 2 seconds.  Neurological:     Mental Status: She is alert and oriented to person, place, and time.  Psychiatric:        Behavior: Behavior normal.      Musculoskeletal Exam: Spine thoracic lumbar spine good range of motion.  Shoulder joints elbow joints wrist joints with good range of motion.  She has some synovial thickening over MCP joints but no synovitis.  PIP thickening was noted with no synovitis.  Hip joints, knee joints, ankles MTPs PIPs were in good range of motion with no synovitis.  CDAI Exam: CDAI Score: 0.6  Patient Global Assessment: 3 (mm); Provider Global Assessment: 3 (mm) Swollen: 0 ; Tender: 0  Joint Exam   Not documented     Investigation: No additional findings.  Imaging: No results found.  Recent Labs: Lab Results  Component Value Date   WBC 3.0 (L) 02/02/2018   HGB 12.6 02/02/2018   PLT 255 02/02/2018   NA 140 02/02/2018   K 4.2 02/02/2018   CL 103 02/02/2018   CO2 29  02/02/2018   GLUCOSE 80 02/02/2018   BUN 11 02/02/2018   CREATININE 0.74 02/02/2018   BILITOT 0.4 02/02/2018   AST 14 02/02/2018   ALT 8 02/02/2018   PROT 7.2 02/02/2018   CALCIUM 9.5 02/02/2018   GFRAA 123 02/02/2018   QFTBGOLDPLUS NEGATIVE 11/22/2017    Speciality Comments: No specialty comments available.  Procedures:  No procedures performed Allergies: Patient has no known allergies.   Assessment / Plan:     Visit Diagnoses: Rheumatoid arthritis involving multiple sites with positive rheumatoid factor (HCC) -  Positive RF and positive anti-CCP , erosive changes in hands and feet.  Patient has not been experiencing some arthralgias that she has been off Cimzia for a month.  She states she had a flare in December while she got off sulfasalazine.  She had no synovitis on examination.  She has some synovial thickening.  No tenderness was noted.  The need for having  regular visits and lab work was emphasized.  We will obtain labs today.  I have given her a sample of Cimzia.  We will call in prescription for Cimzia.  High risk medication use -Cimzia 200 mg every 14 days(patient ran out of the medication) last refill 03/14/18  (started in March 2019) along with sulfasalazine 1000 mg twice daily. Last TB gold negative on 11/22/2017.  Most recent CBC/CMP within normal limits except low WBC on 02/02/2018.  Patient is overdue for labs.  CBC/CMP ordered for today and will monitor every 3 months.  Standing orders are in place.  Recommend annual flu, Pneumovax 23, and Prevnar 13 vaccines as indicated.  Recommend yearly skin exams.  Treated with methotrexate in the past discontinued prior to pregnancy. - Plan: CBC with Differential/Platelet, COMPLETE METABOLIC PANEL WITH GFR, QuantiFERON-TB Gold Plus  Raynaud's disease without gangrene-not active currently.  Vitamin D deficiency -she is on supplement.  Orders: Orders Placed This Encounter  Procedures  . CBC with Differential/Platelet  . COMPLETE  METABOLIC PANEL WITH GFR  . QuantiFERON-TB Gold Plus   No orders of the defined types were placed in this encounter.    Follow-Up Instructions: Return in about 5 months (around 03/14/2019) for Rheumatoid arthritis.   Bo Merino, MD  Note - This record has been created using Editor, commissioning.  Chart creation errors have been sought, but may not always  have been located. Such creation errors do not reflect on  the standard of medical care.

## 2018-10-12 NOTE — Progress Notes (Signed)
Pharmacy Note  Patient seen in office today for rheumatoid arthritis follow-up.  She is currently on Cimzia 200 mg every 14 days and is out of medication due to overdue labs.  Last refill was called in 03/14/18.    Medication Samples have been provided to the patient.  Drug name: Cimzia Strength: 200 mg   Qty:2  [place]: 31594-585-92 LOT: [id number]   Exp.Date: 07/30/2019  Dosing instructions: Inject 1 syringe every 14 days.  The patient has been instructed regarding the correct time, dose, and frequency of taking this medication, including desired effects and most common side effects.   New prior authorization is required.  Will submit and update when we receive a response.  She has [employer] and potentially eligible to fill at [facility].  She was given information about [facility] and will let us know where she would like to fill when we update her about her PA.    All questions encouraged and answered.  Instructed patient to call with any further questions or concerns.  Mariella Saa, PharmD, [facility] Clinical Pharmacist  10/13/2018 1:45 PM

## 2018-10-13 ENCOUNTER — Ambulatory Visit: Payer: BLUE CROSS/BLUE SHIELD | Admitting: Rheumatology

## 2018-10-13 ENCOUNTER — Telehealth: Payer: Self-pay | Admitting: Pharmacist

## 2018-10-13 ENCOUNTER — Encounter: Payer: Self-pay | Admitting: Physician Assistant

## 2018-10-13 VITALS — BP 124/81 | HR 69 | Resp 12 | Ht 65.0 in | Wt 152.2 lb

## 2018-10-13 DIAGNOSIS — E559 Vitamin D deficiency, unspecified: Secondary | ICD-10-CM

## 2018-10-13 DIAGNOSIS — Z79899 Other long term (current) drug therapy: Secondary | ICD-10-CM

## 2018-10-13 DIAGNOSIS — M0579 Rheumatoid arthritis with rheumatoid factor of multiple sites without organ or systems involvement: Secondary | ICD-10-CM

## 2018-10-13 DIAGNOSIS — I7300 Raynaud's syndrome without gangrene: Secondary | ICD-10-CM

## 2018-10-13 NOTE — Patient Instructions (Signed)
Standing Labs We placed an order today for your standing lab work.    Please come back and get your standing labs in April every 3 months  We have open lab Monday through Friday from 8:30-11:30 AM and 1:30-4:00 PM  at the office of Dr. Bo Merino.   You may experience shorter wait times on Monday and Friday afternoons. The office is located at [address], [place], [place], [place] [address] No appointment is necessary.   Labs are drawn by [facility].  You may receive a bill from [facility] for your lab work.  If you wish to have your labs drawn at another location, please call the office 24 hours in advance to send orders.  If you have any questions regarding directions or hours of operation,  please call [phone].   Just as a reminder please drink plenty of water prior to coming for your lab work. Thanks!

## 2018-10-13 NOTE — Telephone Encounter (Signed)
Received a Prior Authorization request from [employer] for [employer]. Authorization has been submitted to patient's insurance via Cover My Meds. Will update once we receive a response.

## 2018-10-13 NOTE — Telephone Encounter (Signed)
New prior authorization is required.  Will submit and update when we receive a response.  She has [employer] and potentially eligible to fill at [facility].  She was given information about [facility] and will let us know where she would like to fill when we update her about her PA.

## 2018-10-14 NOTE — Telephone Encounter (Signed)
Received a fax from [facility] regarding a prior authorization for Meghan Arnold. Authorization has been APPROVED from 10/13/2018 to 10/11/2021.   Will send document to scan center.  Authorization # [id number]

## 2018-10-15 LAB — CBC WITH DIFFERENTIAL/PLATELET
Absolute Monocytes: 311 cells/uL (ref 200–950)
Basophils Absolute: 41 cells/uL (ref 0–200)
Basophils Relative: 1.1 %
Eosinophils Absolute: 100 cells/uL (ref 15–500)
Eosinophils Relative: 2.7 %
HCT: 40 % (ref 35.0–45.0)
Hemoglobin: 13.8 g/dL (ref 11.7–15.5)
Lymphs Abs: 2153 cells/uL (ref 850–3900)
MCH: 31.3 pg (ref 27.0–33.0)
MCHC: 34.5 g/dL (ref 32.0–36.0)
MCV: 90.7 fL (ref 80.0–100.0)
MPV: 9.5 fL (ref 7.5–12.5)
Monocytes Relative: 8.4 %
Neutro Abs: 1095 cells/uL — ABNORMAL LOW (ref 1500–7800)
Neutrophils Relative %: 29.6 %
Platelets: 247 10*3/uL (ref 140–400)
RBC: 4.41 10*6/uL (ref 3.80–5.10)
RDW: 11.8 % (ref 11.0–15.0)
Total Lymphocyte: 58.2 %
WBC: 3.7 10*3/uL — ABNORMAL LOW (ref 3.8–10.8)

## 2018-10-15 LAB — COMPLETE METABOLIC PANEL WITH GFR
AG Ratio: 1.6 (calc) (ref 1.0–2.5)
ALT: 8 U/L (ref 6–29)
AST: 12 U/L (ref 10–30)
Albumin: 4.6 g/dL (ref 3.6–5.1)
Alkaline phosphatase (APISO): 41 U/L (ref 33–115)
BUN: 12 mg/dL (ref 7–25)
CO2: 26 mmol/L (ref 20–32)
Calcium: 9.6 mg/dL (ref 8.6–10.2)
Chloride: 105 mmol/L (ref 98–110)
Creat: 0.72 mg/dL (ref 0.50–1.10)
GFR, Est African American: 128 mL/min/{1.73_m2} (ref >=60)
GFR, Est Non African American: 110 mL/min/{1.73_m2} (ref >=60)
Globulin: 2.8 g/dL (calc) (ref 1.9–3.7)
Glucose, Bld: 83 mg/dL (ref 65–99)
Potassium: 3.8 mmol/L (ref 3.5–5.3)
Sodium: 140 mmol/L (ref 135–146)
Total Bilirubin: 0.4 mg/dL (ref 0.2–1.2)
Total Protein: 7.4 g/dL (ref 6.1–8.1)

## 2018-10-15 LAB — QUANTIFERON-TB GOLD PLUS
Mitogen-NIL: >10 IU/mL
NIL: 0.03 IU/mL
QuantiFERON-TB Gold Plus: NEGATIVE
TB1-NIL: 0 IU/mL
TB2-NIL: 0 IU/mL

## 2018-10-17 MED ORDER — CERTOLIZUMAB PEGOL 2 X 200 MG/ML ~~LOC~~ KIT
200.0000 mg | PACK | SUBCUTANEOUS | 0 refills | Status: DC
Start: 2018-10-17 — End: 2018-12-04

## 2018-10-17 NOTE — Telephone Encounter (Signed)
Left message with patient to schedule 1st Cimzia shipment for [facility].

## 2018-10-17 NOTE — Addendum Note (Signed)
Addended by: Mariella Saa C on: 10/17/2018 09:28 AM   Modules accepted: Orders

## 2018-10-17 NOTE — Telephone Encounter (Signed)
Labs normal and PA approved.  Script sent to [facility]

## 2018-10-24 MED FILL — CIMZIA 200 MG/ML SYRN KIT: 2 X 200 | 28 days supply | Qty: 1 | Fill #0

## 2018-10-24 NOTE — Telephone Encounter (Signed)
Received message from [facility], that patient's Cimzia card is not working. Called Cimzia, and was advised that patient has not activated the card. Called patient twice, but no answer. Left a message.

## 2018-10-25 NOTE — Telephone Encounter (Signed)
Left message for patient

## 2018-11-03 DIAGNOSIS — Z3189 Encounter for other procreative management: Secondary | ICD-10-CM | POA: Diagnosis not present

## 2018-11-07 DIAGNOSIS — Z3189 Encounter for other procreative management: Secondary | ICD-10-CM | POA: Diagnosis not present

## 2018-11-28 ENCOUNTER — Other Ambulatory Visit: Payer: Self-pay | Admitting: Rheumatology

## 2018-11-28 NOTE — Telephone Encounter (Signed)
Last Visit: 10/13/18 Next Visit: 03/13/19 Labs: 10/13/18 WBC count is borderline low but trending up. All other labs are WNL.  Okay to refill per Dr. Estanislado Pandy

## 2018-12-04 ENCOUNTER — Other Ambulatory Visit: Payer: Self-pay

## 2018-12-04 ENCOUNTER — Emergency Department
Admission: EM | Admit: 2018-12-04 | Discharge: 2018-12-04 | Disposition: A | Discharge status: Home / Self Care | Payer: BLUE CROSS/BLUE SHIELD | Attending: Emergency Medicine | Admitting: Emergency Medicine

## 2018-12-04 ENCOUNTER — Encounter: Payer: Self-pay | Admitting: Emergency Medicine

## 2018-12-04 DIAGNOSIS — S1091XA Abrasion of unspecified part of neck, initial encounter: Secondary | ICD-10-CM | POA: Diagnosis not present

## 2018-12-04 DIAGNOSIS — S80812A Abrasion, left lower leg, initial encounter: Secondary | ICD-10-CM | POA: Diagnosis not present

## 2018-12-04 DIAGNOSIS — Y999 Unspecified external cause status: Secondary | ICD-10-CM | POA: Diagnosis not present

## 2018-12-04 DIAGNOSIS — M069 Rheumatoid arthritis, unspecified: Secondary | ICD-10-CM | POA: Insufficient documentation

## 2018-12-04 DIAGNOSIS — M7918 Myalgia, other site: Secondary | ICD-10-CM | POA: Diagnosis not present

## 2018-12-04 DIAGNOSIS — Y9389 Activity, other specified: Secondary | ICD-10-CM | POA: Insufficient documentation

## 2018-12-04 DIAGNOSIS — M79652 Pain in left thigh: Secondary | ICD-10-CM | POA: Diagnosis not present

## 2018-12-04 DIAGNOSIS — T148XXA Other injury of unspecified body region, initial encounter: Secondary | ICD-10-CM | POA: Diagnosis not present

## 2018-12-04 DIAGNOSIS — Y92410 Unspecified street and highway as the place of occurrence of the external cause: Secondary | ICD-10-CM | POA: Diagnosis not present

## 2018-12-04 DIAGNOSIS — Z87891 Personal history of nicotine dependence: Secondary | ICD-10-CM | POA: Insufficient documentation

## 2018-12-04 DIAGNOSIS — R0689 Other abnormalities of breathing: Secondary | ICD-10-CM | POA: Diagnosis not present

## 2018-12-04 DIAGNOSIS — R064 Hyperventilation: Secondary | ICD-10-CM | POA: Diagnosis not present

## 2018-12-04 DIAGNOSIS — R52 Pain, unspecified: Secondary | ICD-10-CM | POA: Diagnosis not present

## 2018-12-04 DIAGNOSIS — R000 Tachycardia, unspecified: Secondary | ICD-10-CM | POA: Diagnosis not present

## 2018-12-04 DIAGNOSIS — R0789 Other chest pain: Secondary | ICD-10-CM | POA: Diagnosis not present

## 2018-12-04 MED ORDER — NABUMETONE 750 MG PO TABS: 750.0000 mg | ORAL_TABLET | Freq: Two times a day (BID) | ORAL | 0 refills | Status: DC

## 2018-12-04 MED ORDER — HYDROCODONE-ACETAMINOPHEN 5-325 MG PO TABS
1.0000 | ORAL_TABLET | Freq: Once | ORAL | Status: AC
  Administered 2018-12-04: 1 via ORAL
  Filled 2018-12-04: qty 1

## 2018-12-04 MED ORDER — CYCLOBENZAPRINE HCL 10 MG PO TABS
10.0000 mg | ORAL_TABLET | Freq: Once | ORAL | Status: AC
  Administered 2018-12-04: 10 mg via ORAL
  Filled 2018-12-04: qty 1

## 2018-12-04 MED ORDER — IBUPROFEN 800 MG PO TABS
800.0000 mg | ORAL_TABLET | Freq: Once | ORAL | Status: AC
  Administered 2018-12-04: 800 mg via ORAL
  Filled 2018-12-04: qty 1

## 2018-12-04 MED ORDER — CYCLOBENZAPRINE HCL 5 MG PO TABS: 5.0000 mg | ORAL_TABLET | Freq: Three times a day (TID) | ORAL | 0 refills | Status: DC | PRN

## 2018-12-04 MED ORDER — BACITRACIN-NEOMYCIN-POLYMYXIN 400-5-5000 EX OINT
TOPICAL_OINTMENT | Freq: Once | CUTANEOUS | Status: AC
  Administered 2018-12-04: 1 via TOPICAL
  Filled 2018-12-04: qty 1
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
## 2018-12-05 MED ORDER — BACITRACIN-NEOMYCIN-POLYMYXIN 400-5-5000 EX OINT
TOPICAL_OINTMENT | CUTANEOUS | Status: AC
  Filled 2018-12-05: qty 1
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
## 2018-12-12 DIAGNOSIS — R0789 Other chest pain: Secondary | ICD-10-CM | POA: Diagnosis not present

## 2018-12-12 DIAGNOSIS — M79604 Pain in right leg: Secondary | ICD-10-CM | POA: Diagnosis not present

## 2018-12-12 DIAGNOSIS — M79605 Pain in left leg: Secondary | ICD-10-CM | POA: Diagnosis not present

## 2018-12-22 MED FILL — CIMZIA 200 MG/ML SYRN KIT: 2 X 200 | 28 days supply | Qty: 1 | Fill #1

## 2019-01-16 IMAGING — DX DG CHEST 2V
2 series · 2 of 2 positions shown · non-contrast
Comparison: None.

CLINICAL DATA: Immunosuppressive therapy, high risk medication use

EXAM:
CHEST - 2 VIEW

[chest pa]
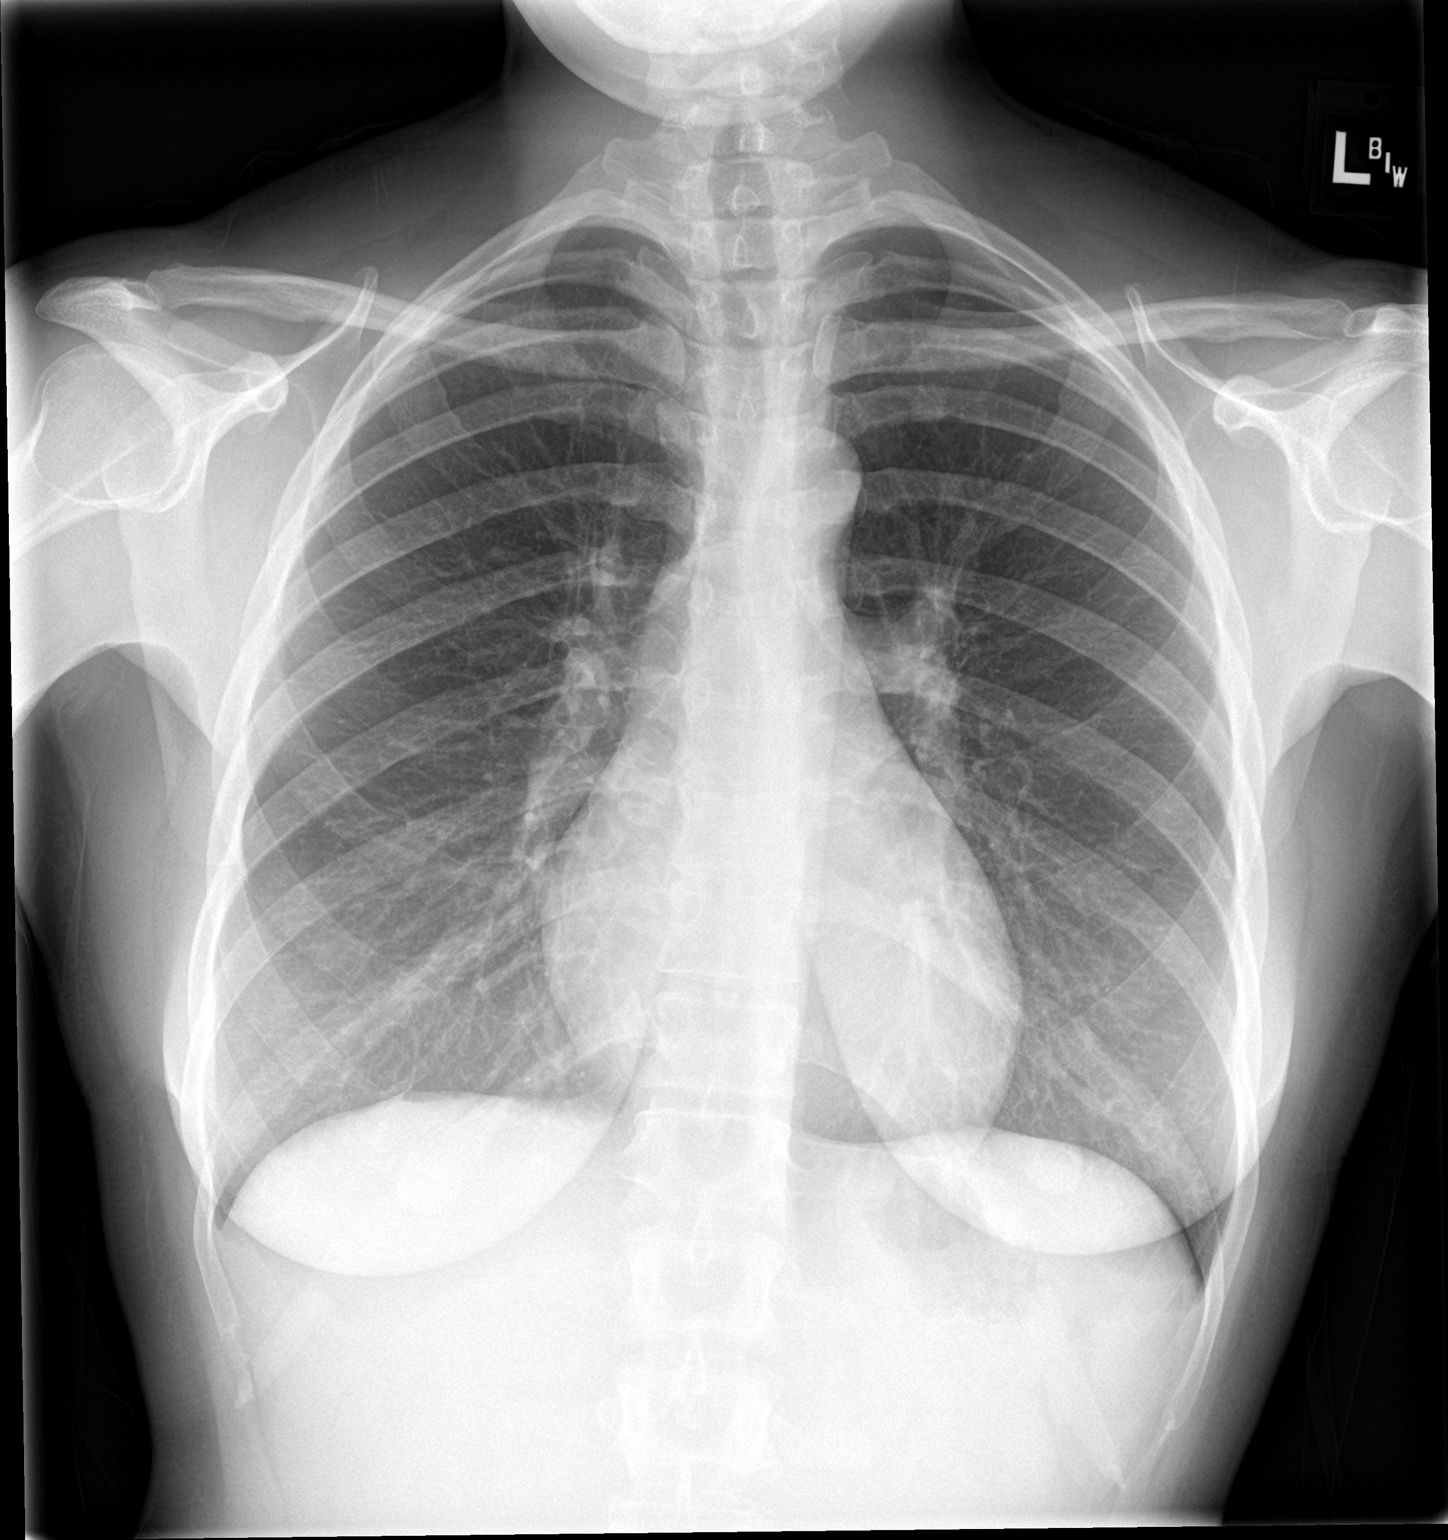

[chest lat]
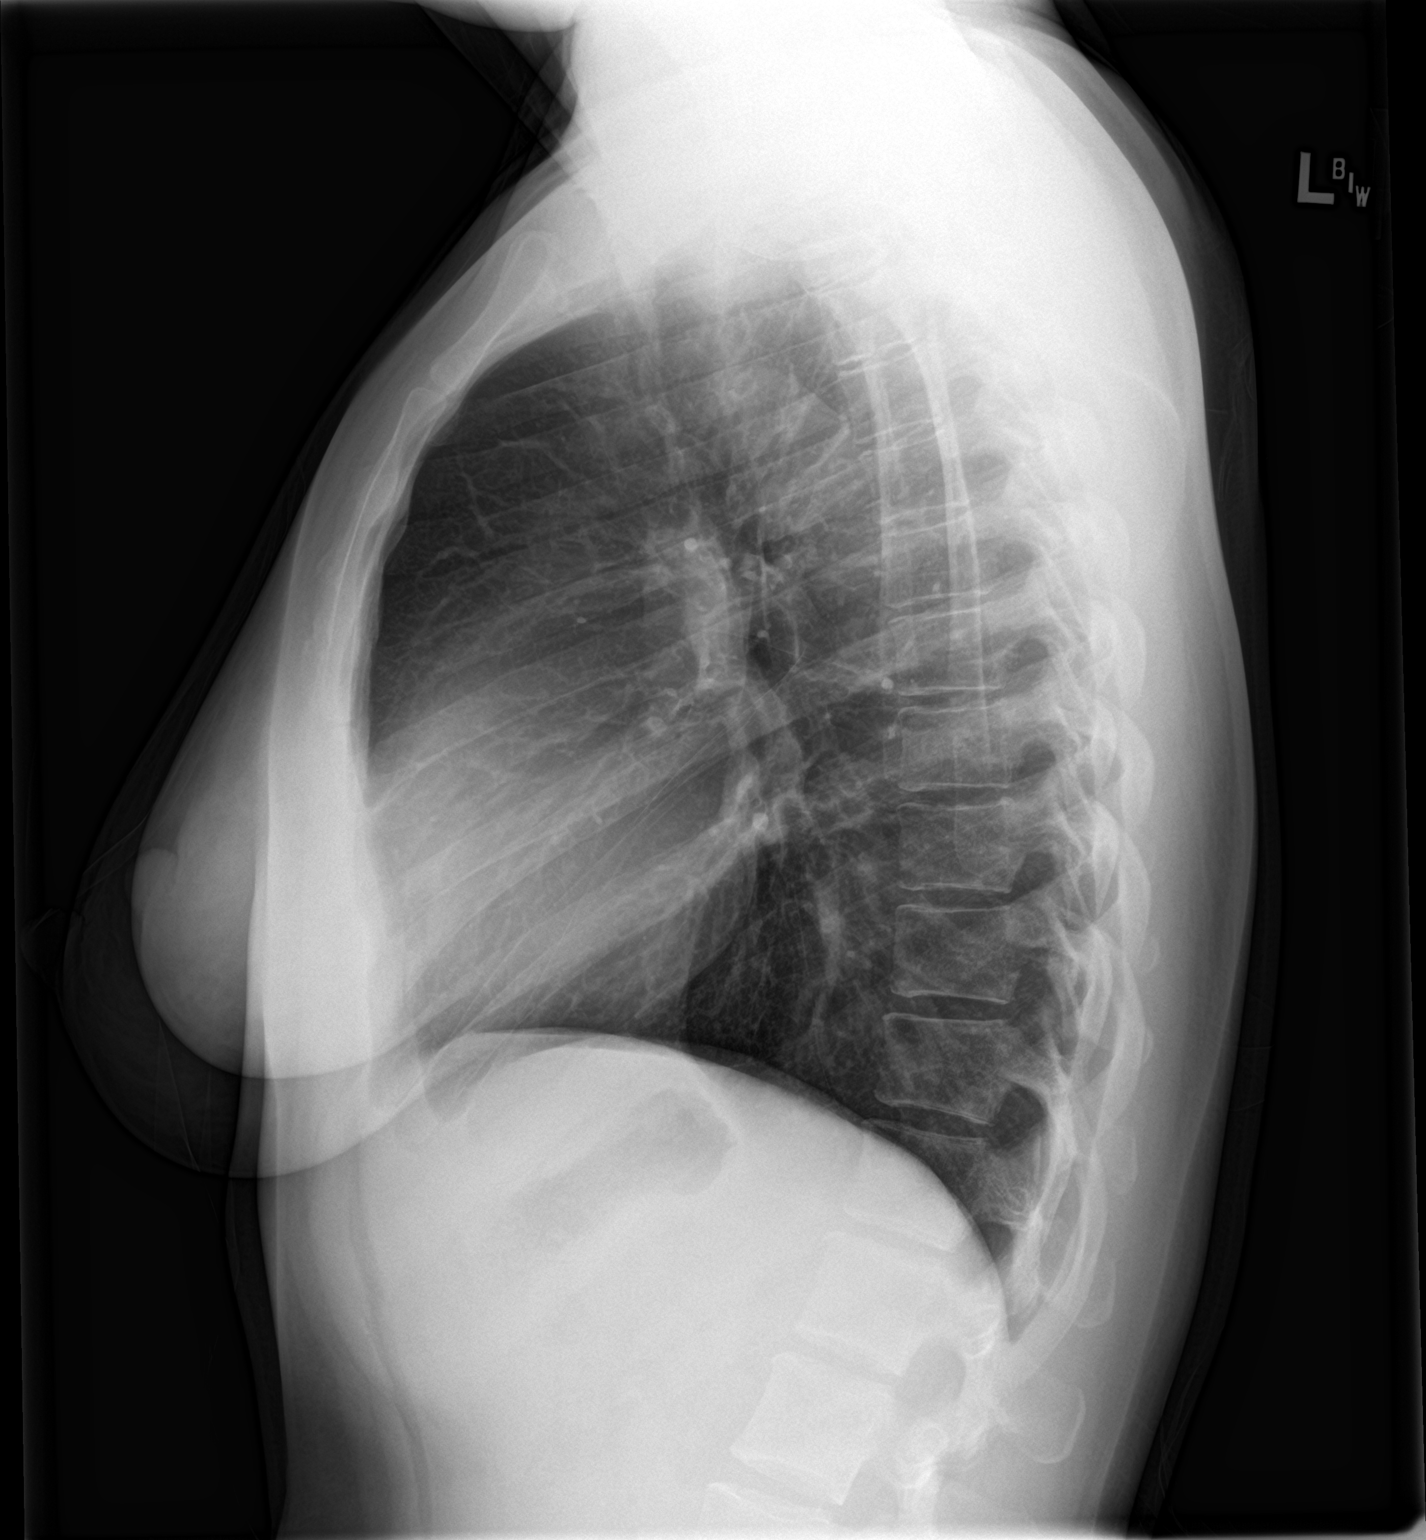

[2 of 2 positions shown; findings below may reference images not displayed]

FINDINGS: The heart size and mediastinal contours are within normal limits.
Both lungs are clear. The visualized skeletal structures are
unremarkable.
IMPRESSION: No active cardiopulmonary disease.

## 2019-01-19 MED FILL — CIMZIA 200 MG/ML SYRN KIT: 2 X 200 | 28 days supply | Qty: 1 | Fill #2

## 2019-02-11 ENCOUNTER — Other Ambulatory Visit: Payer: Self-pay | Admitting: Rheumatology

## 2019-02-11 DIAGNOSIS — M0579 Rheumatoid arthritis with rheumatoid factor of multiple sites without organ or systems involvement: Secondary | ICD-10-CM

## 2019-02-13 NOTE — Telephone Encounter (Addendum)
Last Visit: 10/13/18 Next Visit: 03/13/19 Labs: 10/13/18 WBC count is borderline low but trending up. All other labs are WNL Tb Gold: 10/13/18 Neg   Left message to advise patient she is due for labs.  Okay to refill 30 day supply per Dr. Estanislado Pandy

## 2019-02-23 MED FILL — CIMZIA 200 MG/ML SYRN KIT: 2 X 200 | 28 days supply | Qty: 1 | Fill #0

## 2019-02-27 NOTE — Progress Notes (Deleted)
Office Visit Note  Patient: Meghan Arnold             Date of Birth: 1986/08/13           MRN: [id number]             PCP: Patient, No Pcp Per Referring: No ref. provider found Visit Date: 03/13/2019 Occupation: @GUAROCC @  Subjective:  No chief complaint on file.  Cimzia subcu 200 mg every 14 days and sulfasalazine 500 mg 2 tablets twice daily.  Last TB gold negative on 10/13/2018 and will monitor yearly.  Most recent CBC/CMP within normal limits except for borderline low WBC but trending up on 10/13/2018.  She is overdue for labs.  CBC/CMP ordered for today and will monitor every 3 months.  Standing orders are in place.  Recommend annual flu, Prevnar 13, and Pneumovax 23 vaccines as indicated.  Recommend yearly skin exams.  History of Present Illness: Meghan Arnold is a 33 y.o. female ***   Activities of Daily Living:  Patient reports morning stiffness for *** {minute/hour:19697}.   Patient {ACTIONS;DENIES/REPORTS:21021675::"Denies"} nocturnal pain.  Difficulty dressing/grooming: {ACTIONS;DENIES/REPORTS:21021675::"Denies"} Difficulty climbing stairs: {ACTIONS;DENIES/REPORTS:21021675::"Denies"} Difficulty getting out of chair: {ACTIONS;DENIES/REPORTS:21021675::"Denies"} Difficulty using hands for taps, buttons, cutlery, and/or writing: {ACTIONS;DENIES/REPORTS:21021675::"Denies"}  No Rheumatology ROS completed.   PMFS History:  Patient Active Problem List   Diagnosis Date Noted  . Rheumatoid arthritis involving multiple sites with positive rheumatoid factor ([place]) 12/03/2017  . High risk medication use 12/03/2017  . Raynaud's disease without gangrene 12/03/2017  . Vitamin D deficiency 12/03/2017  . Infertility counseling 07/02/2017  . Fertility testing 05/04/2017    Past Medical History:  Diagnosis Date  . Benign tumor of ovary 2013  . Chlamydia   . Family history of breast cancer   . Raynaud's phenomenon without gangrene    mgmt rheum Dr Annalee Genta  . Rheumatoid  arthritis ([place]) 2017  . Wears contact lenses     Family History  Problem Relation Age of Onset  . Colon cancer Maternal Grandfather   . Breast cancer Maternal Grandmother 72  . Breast cancer Other 58  . Breast cancer Cousin 40   Past Surgical History:  Procedure Laterality Date  . LAPAROSCOPIC SALPINGOOPHERECTOMY  2013   done by dr. Kerin Perna , believes right side laterality   . LAPAROSCOPIC UNILATERAL SALPINGECTOMY Right 12/01/2017   Procedure: LAPAROSCOPIC LYSIS OF ADHESIONS, RIGHT TUBAL OVARIOLYSIS, RIGHT OVARIAN CYST ASPIRATION,CHROMOPERTUBATION, RIGHT FIMBRIOPLASTY;  Surgeon: Governor Specking, MD;  Location: [facility];  Service: Gynecology;  Laterality: Right;  . LAPAROSCOPY    . OVARIAN CYST REMOVAL  08/2012   dr Glennon Mac at [facility] obgyn in [place] ; has abdominal insision    Social History   Social History Narrative  . Not on file   Immunization History  Administered Date(s) Administered  . Tdap 08/11/2016     Objective: Vital Signs: There were no vitals taken for this visit.   Physical Exam   Musculoskeletal Exam: ***  CDAI Exam: CDAI Score: Not documented Patient Global Assessment: Not documented; Provider Global Assessment: Not documented Swollen: Not documented; Tender: Not documented Joint Exam   Not documented   There is currently no information documented on the homunculus. Go to the Rheumatology activity and complete the homunculus joint exam.  Investigation: No additional findings.  Imaging: No results found.  Recent Labs: Lab Results  Component Value Date   WBC 3.7 (L) 10/13/2018   HGB 13.8 10/13/2018   PLT 247 10/13/2018   NA 140  10/13/2018   K 3.8 10/13/2018   CL 105 10/13/2018   CO2 26 10/13/2018   GLUCOSE 83 10/13/2018   BUN 12 10/13/2018   CREATININE 0.72 10/13/2018   BILITOT 0.4 10/13/2018   AST 12 10/13/2018   ALT 8 10/13/2018   PROT 7.4 10/13/2018   CALCIUM 9.6 10/13/2018   GFRAA 128 10/13/2018    QFTBGOLDPLUS NEGATIVE 10/13/2018    Speciality Comments: No specialty comments available.  Procedures:  No procedures performed Allergies: Patient has no known allergies.   Assessment / Plan:     Visit Diagnoses: No diagnosis found.   Orders: No orders of the defined types were placed in this encounter.  No orders of the defined types were placed in this encounter.   Face-to-face time spent with patient was *** minutes. Greater than 50% of time was spent in counseling and coordination of care.  Follow-Up Instructions: No follow-ups on file.   Earnestine Mealing, CMA  Note - This record has been created using Editor, commissioning.  Chart creation errors have been sought, but may not always  have been located. Such creation errors do not reflect on  the standard of medical care.

## 2019-03-13 ENCOUNTER — Ambulatory Visit: Payer: Self-pay | Admitting: Rheumatology

## 2019-03-28 ENCOUNTER — Telehealth: Payer: Self-pay | Admitting: Pharmacy Technician

## 2019-03-28 NOTE — Telephone Encounter (Signed)
Received notification that pharmacy has not been able to reach patient to schedule Cimzia refill. Called patient, no answer/ vm full.  Patient is due for labs.   10:26 AM Beatriz Chancellor, CPhT

## 2019-04-03 NOTE — Telephone Encounter (Signed)
Left voicemail and asked for return call.

## 2019-04-04 ENCOUNTER — Other Ambulatory Visit: Payer: Self-pay | Admitting: Rheumatology

## 2019-04-04 DIAGNOSIS — M0579 Rheumatoid arthritis with rheumatoid factor of multiple sites without organ or systems involvement: Secondary | ICD-10-CM

## 2019-04-04 NOTE — Telephone Encounter (Signed)
Patient returned call to the office. Call back number [phone] or [phone]

## 2019-04-04 NOTE — Telephone Encounter (Signed)
Patient returned my call.  She states she has not received any calls or messages from the pharmacy.  She states she will be due for her Cimzia injection later this week.  She denies missing any doses. She has not experienced any adverse effects. She is due for labs and plans to come to the office tomorrow.  She will call the pharmacy to set up shipment.

## 2019-04-05 NOTE — Telephone Encounter (Addendum)
Last Visit: 10/13/18 Next Visit: due June 2020.  Labs: 1/16/20WBC count is borderline low but trending up. All other labs are WNL Tb Gold: 10/13/18 Neg   Patient to come to office to update labs.

## 2019-04-06 ENCOUNTER — Other Ambulatory Visit: Payer: Self-pay | Admitting: Rheumatology

## 2019-04-06 DIAGNOSIS — M0579 Rheumatoid arthritis with rheumatoid factor of multiple sites without organ or systems involvement: Secondary | ICD-10-CM

## 2019-04-10 ENCOUNTER — Other Ambulatory Visit: Payer: Self-pay | Admitting: *Deleted

## 2019-04-10 DIAGNOSIS — Z79899 Other long term (current) drug therapy: Secondary | ICD-10-CM | POA: Diagnosis not present

## 2019-04-11 ENCOUNTER — Telehealth: Payer: Self-pay | Admitting: *Deleted

## 2019-04-11 DIAGNOSIS — M0579 Rheumatoid arthritis with rheumatoid factor of multiple sites without organ or systems involvement: Secondary | ICD-10-CM

## 2019-04-11 LAB — COMPLETE METABOLIC PANEL WITH GFR
AG Ratio: 1.8 (calc) (ref 1.0–2.5)
ALT: 10 U/L (ref 6–29)
AST: 14 U/L (ref 10–30)
Albumin: 4.7 g/dL (ref 3.6–5.1)
Alkaline phosphatase (APISO): 44 U/L (ref 31–125)
BUN: 8 mg/dL (ref 7–25)
CO2: 26 mmol/L (ref 20–32)
Calcium: 9.9 mg/dL (ref 8.6–10.2)
Chloride: 104 mmol/L (ref 98–110)
Creat: 0.84 mg/dL (ref 0.50–1.10)
GFR, Est African American: 105 mL/min/{1.73_m2} (ref >=60)
GFR, Est Non African American: 91 mL/min/{1.73_m2} (ref >=60)
Globulin: 2.6 g/dL (calc) (ref 1.9–3.7)
Glucose, Bld: 95 mg/dL (ref 65–99)
Potassium: 3.7 mmol/L (ref 3.5–5.3)
Sodium: 140 mmol/L (ref 135–146)
Total Bilirubin: 0.5 mg/dL (ref 0.2–1.2)
Total Protein: 7.3 g/dL (ref 6.1–8.1)

## 2019-04-11 LAB — CBC WITH DIFFERENTIAL/PLATELET
Absolute Monocytes: 440 cells/uL (ref 200–950)
Basophils Absolute: 40 cells/uL (ref 0–200)
Basophils Relative: 0.9 %
Eosinophils Absolute: 9 cells/uL — ABNORMAL LOW (ref 15–500)
Eosinophils Relative: 0.2 %
HCT: 40.3 % (ref 35.0–45.0)
Hemoglobin: 13.5 g/dL (ref 11.7–15.5)
Lymphs Abs: 1672 cells/uL (ref 850–3900)
MCH: 30.6 pg (ref 27.0–33.0)
MCHC: 33.5 g/dL (ref 32.0–36.0)
MCV: 91.4 fL (ref 80.0–100.0)
MPV: 9.4 fL (ref 7.5–12.5)
Monocytes Relative: 10 %
Neutro Abs: 2240 cells/uL (ref 1500–7800)
Neutrophils Relative %: 50.9 %
Platelets: 245 10*3/uL (ref 140–400)
RBC: 4.41 10*6/uL (ref 3.80–5.10)
RDW: 11.6 % (ref 11.0–15.0)
Total Lymphocyte: 38 %
WBC: 4.4 10*3/uL (ref 3.8–10.8)

## 2019-04-11 MED ORDER — CIMZIA PREFILLED 2 X 200 MG/ML ~~LOC~~ KIT: PACK | SUBCUTANEOUS | 0 refills | Status: DC

## 2019-04-11 NOTE — Telephone Encounter (Signed)
-----   Message from Bo Merino, MD sent at 04/11/2019  8:18 AM EDT ----- Within normal limits.

## 2019-04-11 NOTE — Telephone Encounter (Signed)
LMOM for patient to call and schedule follow-up appointment.   °

## 2019-04-11 NOTE — Progress Notes (Signed)
Within normal limits

## 2019-04-11 NOTE — Telephone Encounter (Signed)
Please schedule patient for a follow up visit. Patient was due June 2020. Thanks! 

## 2019-04-11 NOTE — Telephone Encounter (Signed)
Last Visit: 10/13/18 Next Visit: due June 2020. Message sent to the front to schedule patient  Labs: 04/10/19 WNL Tb Gold: 10/13/18 Neg  Okay to refill per Dr. Estanislado Pandy

## 2019-04-12 MED FILL — CIMZIA 200 MG/ML SYRN KIT: 2 X 200 | 28 days supply | Qty: 1 | Fill #0

## 2019-04-25 DIAGNOSIS — D2239 Melanocytic nevi of other parts of face: Secondary | ICD-10-CM | POA: Diagnosis not present

## 2019-05-08 ENCOUNTER — Other Ambulatory Visit: Payer: Self-pay | Admitting: Rheumatology

## 2019-05-08 DIAGNOSIS — M0579 Rheumatoid arthritis with rheumatoid factor of multiple sites without organ or systems involvement: Secondary | ICD-10-CM

## 2019-05-08 NOTE — Telephone Encounter (Signed)
Last Visit: 10/13/18 Next Visit: was due June 2020. Message sent to the front to schedule patient  Labs: 04/10/19 WNL TB Gold: 10/13/18 neg   Okay to refill per Dr. Estanislado Pandy

## 2019-05-08 NOTE — Telephone Encounter (Signed)
Please schedule patient for a follow up. Patient was due June 2020. Thanks!

## 2019-05-08 NOTE — Telephone Encounter (Signed)
Patient's voicemail was full / unable to leave a message to schedule follow-up appointment.

## 2019-05-15 MED FILL — CIMZIA 200 MG/ML SYRN KIT: 2 X 200 | 28 days supply | Qty: 1 | Fill #0

## 2019-06-12 ENCOUNTER — Telehealth: Payer: Self-pay | Admitting: Pharmacy Technician

## 2019-06-12 NOTE — Telephone Encounter (Signed)
Received notification that pharmacy has not been able to reach patient to schedule Cimzia refill. Office has also been trying to reach patient to schedule f/u visit. Called patient, left voicemail.  9:30 AM Beatriz Chancellor, CPhT

## 2019-06-19 NOTE — Telephone Encounter (Signed)
Left message for patient again to follow up. Please see note below.

## 2019-06-26 NOTE — Telephone Encounter (Signed)
Left another message for patient. Pharmacy has not been able to reach patient to schedule refill. Her last refill was 05/15/2019. Office has also been trying to reach patient to schedule f/u appt.  12:00 PM Beatriz Chancellor, CPhT

## 2019-06-30 NOTE — Telephone Encounter (Signed)
Attempted to contact the patient and left message for patient to call the office.  

## 2019-07-05 ENCOUNTER — Other Ambulatory Visit: Payer: Self-pay | Admitting: Rheumatology

## 2019-07-05 DIAGNOSIS — M0579 Rheumatoid arthritis with rheumatoid factor of multiple sites without organ or systems involvement: Secondary | ICD-10-CM

## 2019-07-05 NOTE — Telephone Encounter (Signed)
Patient has opted out of specialty pharmacy follow up.

## 2019-08-10 NOTE — Progress Notes (Signed)
Office Visit Note  Patient: Meghan Arnold             Date of Birth: 02/05/1986           MRN: [id number]             PCP: Patient, No Pcp Per Referring: No ref. provider found Visit Date: 08/14/2019 Occupation: _0 @  Subjective:  Discuss medications    History of Present Illness: Meghan Arnold is a 33 y.o. female with history of seropositive rheumatoid arthritis and Raynaud's syndrome.  Patient has been taking sulfasalazine 500 mg 2 tablets by mouth twice daily.  She states that she came off of Cimzia about 2 months ago due to needing a follow-up visit and refills.  She states that she is not had any worsening symptoms since coming off of Cimzia.  She states she continues to have intermittent pain, swelling, and stiffness in both hands and both knee joints.  She states that her discomfort is worse in the morning but usually subsides within 30 minutes.  She would like to restart on Cimzia and continue taking sulfasalazine. She has not had any symptoms of Raynaud's recently.   Activities of Daily Living:  Patient reports morning stiffness for several hours.   Patient Reports nocturnal pain.  Difficulty dressing/grooming: Denies Difficulty climbing stairs: Reports Difficulty getting out of chair: Denies Difficulty using hands for taps, buttons, cutlery, and/or writing: Reports  Review of Systems  Constitutional: Negative for fatigue.  HENT: Negative for mouth sores, mouth dryness and nose dryness.   Eyes: Negative for itching and dryness.  Respiratory: Negative for shortness of breath, wheezing and difficulty breathing.   Cardiovascular: Negative for chest pain and palpitations.  Gastrointestinal: Negative for blood in stool, constipation and diarrhea.  Endocrine: Negative for increased urination.  Genitourinary: Negative for difficulty urinating and painful urination.  Musculoskeletal: Positive for arthralgias, joint pain, joint swelling and morning stiffness.  Skin:  Negative for rash and hair loss.  Allergic/Immunologic: Negative for susceptible to infections.  Neurological: Negative for dizziness, numbness, headaches, memory loss and weakness.  Hematological: Negative for bruising/bleeding tendency.  Psychiatric/Behavioral: Negative for confusion.    PMFS History:  Patient Active Problem List   Diagnosis Date Noted  . Rheumatoid arthritis involving multiple sites with positive rheumatoid factor ([place]) 12/03/2017  . High risk medication use 12/03/2017  . Raynaud's disease without gangrene 12/03/2017  . Vitamin D deficiency 12/03/2017  . Infertility counseling 07/02/2017  . Fertility testing 05/04/2017    Past Medical History:  Diagnosis Date  . Benign tumor of ovary 2013  . Chlamydia   . Family history of breast cancer   . Raynaud's phenomenon without gangrene    mgmt rheum Dr Annalee Genta  . Rheumatoid arthritis ([place]) 2017  . Wears contact lenses     Family History  Problem Relation Age of Onset  . Colon cancer Maternal Grandfather   . Breast cancer Maternal Grandmother 72  . Breast cancer Other 1  . Breast cancer Cousin 40   Past Surgical History:  Procedure Laterality Date  . LAPAROSCOPIC SALPINGOOPHERECTOMY  2013   done by dr. Kerin Perna , believes right side laterality   . LAPAROSCOPIC UNILATERAL SALPINGECTOMY Right 12/01/2017   Procedure: LAPAROSCOPIC LYSIS OF ADHESIONS, RIGHT TUBAL OVARIOLYSIS, RIGHT OVARIAN CYST ASPIRATION,CHROMOPERTUBATION, RIGHT FIMBRIOPLASTY;  Surgeon: Governor Specking, MD;  Location: [facility];  Service: Gynecology;  Laterality: Right;  . LAPAROSCOPY    . OVARIAN CYST REMOVAL  08/2012   dr Glennon Mac at  [facility] in [place] ; has abdominal insision    Social History   Social History Narrative  . Not on file   Immunization History  Administered Date(s) Administered  . Tdap 08/11/2016     Objective: Vital Signs: BP 132/87 (BP Location: Left Arm, Patient Position: Sitting, Cuff  Size: Normal)   Pulse 74   Resp 12   Ht 5' 5" (1.651 m)   Wt 161 lb 9.6 oz (73.3 kg)   BMI 26.89 kg/m    Physical Exam Vitals signs and nursing note reviewed.  Constitutional:      Appearance: She is well-developed.  HENT:     Head: Normocephalic and atraumatic.  Eyes:     Conjunctiva/sclera: Conjunctivae normal.  Neck:     Musculoskeletal: Normal range of motion.  Cardiovascular:     Rate and Rhythm: Normal rate and regular rhythm.     Heart sounds: Normal heart sounds.  Pulmonary:     Effort: Pulmonary effort is normal.     Breath sounds: Normal breath sounds.  Abdominal:     General: Bowel sounds are normal.     Palpations: Abdomen is soft.  Lymphadenopathy:     Cervical: No cervical adenopathy.  Skin:    General: Skin is warm and dry.     Capillary Refill: Capillary refill takes less than 2 seconds.  Neurological:     Mental Status: She is alert and oriented to person, place, and time.  Psychiatric:        Behavior: Behavior normal.      Musculoskeletal Exam: C-spine, thoracic spine, and lumbars spine good ROM with no discomfort.  Shoulder joints, elbow joints, wrist joints, MCPs, PIPs, and DIPs good ROM with no synovitis.  Bilateral 1st, 2nd, and 3rd MCP joint synovial thickening. Hip joints, knee joints, ankle joints, MTPs, PIPs, and DIPs good ROM with no synovitis.  No warmth or effusion of knee joints. No tenderness or swelling of ankle joints.   CDAI Exam: CDAI Score: 0.6  Patient Global: 3 mm; Provider Global: 3 mm Swollen: 0 ; Tender: 0  Joint Exam   No joint exam has been documented for this visit   There is currently no information documented on the homunculus. Go to the Rheumatology activity and complete the homunculus joint exam.  Investigation: No additional findings.  Imaging: No results found.  Recent Labs: Lab Results  Component Value Date   WBC 4.4 04/10/2019   HGB 13.5 04/10/2019   PLT 245 04/10/2019   NA 140 04/10/2019   K 3.7  04/10/2019   CL 104 04/10/2019   CO2 26 04/10/2019   GLUCOSE 95 04/10/2019   BUN 8 04/10/2019   CREATININE 0.84 04/10/2019   BILITOT 0.5 04/10/2019   AST 14 04/10/2019   ALT 10 04/10/2019   PROT 7.3 04/10/2019   CALCIUM 9.9 04/10/2019   GFRAA 105 04/10/2019   QFTBGOLDPLUS NEGATIVE 10/13/2018    Speciality Comments: No specialty comments available.  Procedures:  No procedures performed Allergies: Patient has no known allergies.   Assessment / Plan:     Visit Diagnoses: Rheumatoid arthritis involving multiple sites with positive rheumatoid factor (HCC) - Positive RF and positive anti-CCP , erosive changes in hands and feet: She has no synovitis on exam.  She has not had any recent rheumatoid arthritis flares.  She has been taking sulfasalazine 500 mg 2 tablets by mouth twice daily.  She has been off of Cimzia for about 2 months due to requiring lab work, refills,  and an office visit.  She would like to restart on Cimzia.  She continues to have intermittent pain in bilateral hands and bilateral knee joints worse in the morning.  Her stiffness and discomfort typically resolves within 20 to 30 minutes.  She was given a sample of Cimzia today in the office.  We will also send a refill of Cimzia and to the pharmacy.  She will continue taking sulfasalazine as prescribed.  She was advised to notify us if her joint pain and joint swelling is persistent.  She will follow-up in the office in 3 months.- Plan: Certolizumab Pegol (CIMZIA PREFILLED) 2 X 200 MG/ML KIT  High risk medication use -Cimzia 200 mg every 14 days and sulfasalazine 500 mg 2 tablets twice daily.  Last TB gold negative on 10/13/2018 and will monitor yearly.  Future order for TB gold was placed today.  Most recent CBC/cMP within normal limits on 04/10/2019.  Due for CBC/CMP today and will monitor yearly.   - Plan: CBC, CMP, QuantiFERON-TB Gold Plus  Raynaud's disease without gangrene: Not active at this time.  No digital ulcerations or  signs of gangrene.   Vitamin D deficiency    Orders: Orders Placed This Encounter  Procedures  . CBC  . CMP  . QuantiFERON-TB Gold Plus   Meds ordered this encounter  Medications  . Certolizumab Pegol (CIMZIA PREFILLED) 2 X 200 MG/ML KIT    Sig: Inject 200 mg into the skin every 14 (fourteen) days.    Dispense:  3 kit    Refill:  0    Follow-Up Instructions: Return in about 3 months (around 11/14/2019) for Rheumatoid arthritis.   Ofilia Neas, PA-C   Note - This record has been created using Dragon software.  Chart creation errors have been sought, but may not always  have been located. Such creation errors do not reflect on  the standard of medical care.

## 2019-08-14 ENCOUNTER — Encounter: Payer: Self-pay | Admitting: Physician Assistant

## 2019-08-14 ENCOUNTER — Other Ambulatory Visit: Payer: Self-pay

## 2019-08-14 ENCOUNTER — Ambulatory Visit: Payer: BLUE CROSS/BLUE SHIELD | Admitting: Physician Assistant

## 2019-08-14 VITALS — BP 132/87 | HR 74 | Resp 12 | Ht 65.0 in | Wt 161.6 lb

## 2019-08-14 DIAGNOSIS — M0579 Rheumatoid arthritis with rheumatoid factor of multiple sites without organ or systems involvement: Secondary | ICD-10-CM

## 2019-08-14 DIAGNOSIS — Z79899 Other long term (current) drug therapy: Secondary | ICD-10-CM

## 2019-08-14 DIAGNOSIS — E559 Vitamin D deficiency, unspecified: Secondary | ICD-10-CM | POA: Diagnosis not present

## 2019-08-14 DIAGNOSIS — I7300 Raynaud's syndrome without gangrene: Secondary | ICD-10-CM | POA: Diagnosis not present

## 2019-08-14 MED ORDER — CIMZIA PREFILLED 2 X 200 MG/ML ~~LOC~~ KIT: PACK | SUBCUTANEOUS | 0 refills | Status: DC

## 2019-08-14 MED FILL — CIMZIA 200 MG/ML SYRN KIT: 2 X 200 | 28 days supply | Qty: 1 | Fill #0

## 2019-08-14 NOTE — Progress Notes (Signed)
Medication Samples have been provided to the patient.  Drug name: Cimzia Strength: 258m     Qty: 1 kit LOT: [id number].Date: 06/2020  Dosing instructions: Inject 20107minto the skin every 14 days.   The patient has been instructed regarding the correct time, dose, and frequency of taking this medication, including desired effects and most common side effects.   MaEarnestine Mealing:51 AM 08/14/2019

## 2019-08-14 NOTE — Patient Instructions (Signed)
Standing Labs We placed an order today for your standing lab work.    Please come back and get your standing labs in February and every 3 months  We have open lab daily Monday through Thursday from 8:30-12:30 PM and 1:30-4:30 PM and Friday from 8:30-12:30 PM and 1:30-4:00 PM at the office of Dr. Shaili Deveshwar.   You may experience shorter wait times on Monday and Friday afternoons. The office is located at [address], [address], [place], [place] [address] No appointment is necessary.   Labs are drawn by [facility].  You may receive a bill from [facility] for your lab work.  If you wish to have your labs drawn at another location, please call the office 24 hours in advance to send orders.  If you have any questions regarding directions or hours of operation,  please call [phone].   Just as a reminder please drink plenty of water prior to coming for your lab work. Thanks!  

## 2019-08-15 LAB — COMPLETE METABOLIC PANEL WITH GFR
AG Ratio: 1.7 (calc) (ref 1.0–2.5)
ALT: 8 U/L (ref 6–29)
AST: 15 U/L (ref 10–30)
Albumin: 4.7 g/dL (ref 3.6–5.1)
Alkaline phosphatase (APISO): 44 U/L (ref 31–125)
BUN: 11 mg/dL (ref 7–25)
CO2: 26 mmol/L (ref 20–32)
Calcium: 9.6 mg/dL (ref 8.6–10.2)
Chloride: 104 mmol/L (ref 98–110)
Creat: 0.77 mg/dL (ref 0.50–1.10)
GFR, Est African American: 117 mL/min/{1.73_m2} (ref >=60)
GFR, Est Non African American: 101 mL/min/{1.73_m2} (ref >=60)
Globulin: 2.8 g/dL (calc) (ref 1.9–3.7)
Glucose, Bld: 76 mg/dL (ref 65–99)
Potassium: 4.2 mmol/L (ref 3.5–5.3)
Sodium: 139 mmol/L (ref 135–146)
Total Bilirubin: 0.4 mg/dL (ref 0.2–1.2)
Total Protein: 7.5 g/dL (ref 6.1–8.1)

## 2019-08-15 LAB — CBC WITH DIFFERENTIAL/PLATELET
Absolute Monocytes: 486 cells/uL (ref 200–950)
Basophils Absolute: 60 cells/uL (ref 0–200)
Basophils Relative: 1.4 %
Eosinophils Absolute: 237 cells/uL (ref 15–500)
Eosinophils Relative: 5.5 %
HCT: 38.4 % (ref 35.0–45.0)
Hemoglobin: 12.9 g/dL (ref 11.7–15.5)
Lymphs Abs: 1883 cells/uL (ref 850–3900)
MCH: 30.7 pg (ref 27.0–33.0)
MCHC: 33.6 g/dL (ref 32.0–36.0)
MCV: 91.4 fL (ref 80.0–100.0)
MPV: 9.8 fL (ref 7.5–12.5)
Monocytes Relative: 11.3 %
Neutro Abs: 1634 cells/uL (ref 1500–7800)
Neutrophils Relative %: 38 %
Platelets: 287 10*3/uL (ref 140–400)
RBC: 4.2 10*6/uL (ref 3.80–5.10)
RDW: 11.6 % (ref 11.0–15.0)
Total Lymphocyte: 43.8 %
WBC: 4.3 10*3/uL (ref 3.8–10.8)

## 2019-08-15 NOTE — Progress Notes (Signed)
CBC and CMP WNL

## 2019-09-18 MED FILL — CIMZIA 200 MG/ML SYRN KIT: 2 X 200 | 28 days supply | Qty: 1 | Fill #0

## 2019-11-07 NOTE — Progress Notes (Signed)
Office Visit Note  Patient: Meghan Arnold             Date of Birth: 06-28-1986           MRN: [id number]             PCP: Patient, No Pcp Per Referring: No ref. provider found Visit Date: 11/13/2019 Occupation: @GUAROCC @  Subjective:  Medication monitoring.   History of Present Illness: Meghan Arnold is a 34 y.o. female with history of seropositive rheumatoid arthritis.  She states she has reduced her sulfasalazine to 1 tablet twice a day as she forgets her medication.  She is taking Cimzia 1 injection every other week she has not noticed any increased joint pain or joint swelling.  Her Raynaud's is currently not very active.  Activities of Daily Living:  Patient reports morning stiffness for 0 minutes.   Patient Reports nocturnal pain.  Difficulty dressing/grooming: Denies Difficulty climbing stairs: Denies Difficulty getting out of chair: Denies Difficulty using hands for taps, buttons, cutlery, and/or writing: Reports  Review of Systems  Constitutional: Negative for fatigue, night sweats, weight gain and weight loss.  HENT: Positive for mouth dryness. Negative for mouth sores, trouble swallowing, trouble swallowing and nose dryness.   Eyes: Negative for pain, redness, itching, visual disturbance and dryness.  Respiratory: Negative for cough, shortness of breath and difficulty breathing.   Cardiovascular: Negative for chest pain, palpitations, hypertension, irregular heartbeat and swelling in legs/feet.  Gastrointestinal: Negative for blood in stool, constipation and diarrhea.  Endocrine: Negative for increased urination.  Genitourinary: Negative for difficulty urinating, painful urination and vaginal dryness.  Musculoskeletal: Positive for arthralgias and joint pain. Negative for joint swelling, myalgias, muscle weakness, morning stiffness, muscle tenderness and myalgias.  Skin: Negative for color change, rash, hair loss, skin tightness, ulcers and sensitivity to  sunlight.  Allergic/Immunologic: Negative for susceptible to infections.  Neurological: Positive for headaches. Negative for dizziness, memory loss, night sweats and weakness.  Hematological: Negative for bruising/bleeding tendency and swollen glands.  Psychiatric/Behavioral: Negative for depressed mood, confusion and sleep disturbance. The patient is not nervous/anxious.     PMFS History:  Patient Active Problem List   Diagnosis Date Noted  . Rheumatoid arthritis involving multiple sites with positive rheumatoid factor ([place]) 12/03/2017  . High risk medication use 12/03/2017  . Raynaud's disease without gangrene 12/03/2017  . Vitamin D deficiency 12/03/2017  . Infertility counseling 07/02/2017  . Fertility testing 05/04/2017    Past Medical History:  Diagnosis Date  . Benign tumor of ovary 2013  . Chlamydia   . Family history of breast cancer   . Raynaud's phenomenon without gangrene    mgmt rheum Dr Annalee Genta  . Rheumatoid arthritis ([place]) 2017  . Wears contact lenses     Family History  Problem Relation Age of Onset  . Colon cancer Maternal Grandfather   . Breast cancer Maternal Grandmother 72  . Breast cancer Other 65  . Breast cancer Cousin 40   Past Surgical History:  Procedure Laterality Date  . LAPAROSCOPIC SALPINGOOPHERECTOMY  2013   done by dr. Kerin Perna , believes right side laterality   . LAPAROSCOPIC UNILATERAL SALPINGECTOMY Right 12/01/2017   Procedure: LAPAROSCOPIC LYSIS OF ADHESIONS, RIGHT TUBAL OVARIOLYSIS, RIGHT OVARIAN CYST ASPIRATION,CHROMOPERTUBATION, RIGHT FIMBRIOPLASTY;  Surgeon: Governor Specking, MD;  Location: [facility];  Service: Gynecology;  Laterality: Right;  . LAPAROSCOPY    . OVARIAN CYST REMOVAL  08/2012   dr Glennon Mac at [facility] in [place] ; has  abdominal insision    Social History   Social History Narrative  . Not on file   Immunization History  Administered Date(s) Administered  . Tdap 08/11/2016      Objective: Vital Signs: BP 127/87 (BP Location: Left Arm, Patient Position: Sitting, Cuff Size: Normal)   Pulse 74   Resp 14   Ht 5\' 5"  (1.651 m)   Wt 167 lb (75.8 kg)   BMI 27.79 kg/m    Physical Exam Vitals and nursing note reviewed.  Constitutional:      Appearance: She is well-developed.  HENT:     Head: Normocephalic and atraumatic.  Eyes:     Conjunctiva/sclera: Conjunctivae normal.  Cardiovascular:     Rate and Rhythm: Normal rate and regular rhythm.     Heart sounds: Normal heart sounds.  Pulmonary:     Effort: Pulmonary effort is normal.     Breath sounds: Normal breath sounds.  Abdominal:     General: Bowel sounds are normal.     Palpations: Abdomen is soft.  Musculoskeletal:     Cervical back: Normal range of motion.  Lymphadenopathy:     Cervical: No cervical adenopathy.  Skin:    General: Skin is warm and dry.     Capillary Refill: Capillary refill takes less than 2 seconds.  Neurological:     Mental Status: She is alert and oriented to person, place, and time.  Psychiatric:        Behavior: Behavior normal.      Musculoskeletal Exam: C-spine thoracic and lumbar spine with good range of motion.  Shoulder joints, elbow joints were in good range of motion.  She has some limitation of range of motion of her wrist joint extension.  She is some thickening of bilateral first MCP joints.  Mild second and third MCP joint thickening was noted.  No PIP and DIP changes were noted.  No synovitis was noted.  Hip joints, knee joints, ankles with good range of motion.  No synovitis over MCPs and PIPs was noted.  CDAI Exam: CDAI Score: 0  Patient Global: 0 mm; Provider Global: 0 mm Swollen: 0 ; Tender: 0  Joint Exam 11/13/2019   No joint exam has been documented for this visit   There is currently no information documented on the homunculus. Go to the Rheumatology activity and complete the homunculus joint exam.  Investigation: No additional  findings.  Imaging: No results found.  Recent Labs: Lab Results  Component Value Date   WBC 4.3 08/14/2019   HGB 12.9 08/14/2019   PLT 287 08/14/2019   NA 139 08/14/2019   K 4.2 08/14/2019   CL 104 08/14/2019   CO2 26 08/14/2019   GLUCOSE 76 08/14/2019   BUN 11 08/14/2019   CREATININE 0.77 08/14/2019   BILITOT 0.4 08/14/2019   AST 15 08/14/2019   ALT 8 08/14/2019   PROT 7.5 08/14/2019   CALCIUM 9.6 08/14/2019   GFRAA 117 08/14/2019   QFTBGOLDPLUS NEGATIVE 10/13/2018    Speciality Comments: No specialty comments available.  Procedures:  No procedures performed Allergies: Patient has no known allergies.   Assessment / Plan:     Visit Diagnoses: Rheumatoid arthritis involving multiple sites with positive rheumatoid factor (HCC) - Positive RF and positive anti-CCP , erosive changes in hands and feet -she continues to have some stiffness in her joints.  No synovitis was noted.  She believes the combination of Cimzia and sulfasalazine is working well for her.  She is taking sulfasalazine intermittently.  Have advised her to discontinue sulfasalazine and see how she does.  X-rays were obtained to monitor for radiographic progression.  No radiographic progression was noted when compared to the films of February 2019.  Plan: XR Hand 2 View Right, XR Hand 2 View Left, XR Foot 2 Views Left, XR Foot 2 Views Right  High risk medication use - Cimzia 200 mg every 14 days and sulfasalazine 500 mg 2 tablets twice daily.  Last TB gold negative on 10/13/2018 and will monitor yearly.  We will check labs today and then every 3 months to monitor for drug toxicity.- Plan: CBC with Differential/Platelet, COMPLETE METABOLIC PANEL WITH GFR, QuantiFERON-TB Gold Plus, XR Hand 2 View Right, XR Hand 2 View Left, XR Foot 2 Views Left, XR Foot 2 Views Right  Bilateral hand pain -she has synovial thickening but no synovitis on examination.  Plan: XR Hand 2 View Right, XR Hand 2 View Left  Bilateral foot pain  -no synovitis was noted.  Plan: XR Foot 2 Views Left, XR Foot 2 Views Right  Raynaud's disease without gangrene-currently manageable.  Vitamin D deficiency -patient has not been taking vitamin D.  Have encouraged her to take vitamin D on a regular basis.  Plan: VITAMIN D 25 Hydroxy (Vit-D Deficiency, Fractures)  Orders: Orders Placed This Encounter  Procedures  . XR Hand 2 View Right  . XR Hand 2 View Left  . XR Foot 2 Views Left  . XR Foot 2 Views Right  . CBC with Differential/Platelet  . COMPLETE METABOLIC PANEL WITH GFR  . QuantiFERON-TB Gold Plus  . VITAMIN D 25 Hydroxy (Vit-D Deficiency, Fractures)   No orders of the defined types were placed in this encounter.    Follow-Up Instructions: Return in about 3 months (around 02/10/2020) for Rheumatoid arthritis.   Bo Merino, MD  Note - This record has been created using Editor, commissioning.  Chart creation errors have been sought, but may not always  have been located. Such creation errors do not reflect on  the standard of medical care.

## 2019-11-13 ENCOUNTER — Ambulatory Visit: Payer: Self-pay

## 2019-11-13 ENCOUNTER — Telehealth: Payer: Self-pay | Admitting: Rheumatology

## 2019-11-13 ENCOUNTER — Encounter: Payer: Self-pay | Admitting: Rheumatology

## 2019-11-13 ENCOUNTER — Other Ambulatory Visit: Payer: Self-pay

## 2019-11-13 ENCOUNTER — Ambulatory Visit (INDEPENDENT_AMBULATORY_CARE_PROVIDER_SITE_OTHER): Payer: BC Managed Care – PPO | Admitting: Rheumatology

## 2019-11-13 VITALS — BP 127/87 | HR 74 | Resp 14 | Ht 65.0 in | Wt 167.0 lb

## 2019-11-13 DIAGNOSIS — M79672 Pain in left foot: Secondary | ICD-10-CM | POA: Diagnosis not present

## 2019-11-13 DIAGNOSIS — M0579 Rheumatoid arthritis with rheumatoid factor of multiple sites without organ or systems involvement: Secondary | ICD-10-CM

## 2019-11-13 DIAGNOSIS — E559 Vitamin D deficiency, unspecified: Secondary | ICD-10-CM

## 2019-11-13 DIAGNOSIS — M79671 Pain in right foot: Secondary | ICD-10-CM | POA: Diagnosis not present

## 2019-11-13 DIAGNOSIS — Z79899 Other long term (current) drug therapy: Secondary | ICD-10-CM | POA: Diagnosis not present

## 2019-11-13 DIAGNOSIS — M79641 Pain in right hand: Secondary | ICD-10-CM | POA: Diagnosis not present

## 2019-11-13 DIAGNOSIS — M79642 Pain in left hand: Secondary | ICD-10-CM

## 2019-11-13 DIAGNOSIS — I7300 Raynaud's syndrome without gangrene: Secondary | ICD-10-CM

## 2019-11-13 DIAGNOSIS — N971 Female infertility of tubal origin: Secondary | ICD-10-CM | POA: Diagnosis not present

## 2019-11-13 DIAGNOSIS — N83291 Other ovarian cyst, right side: Secondary | ICD-10-CM | POA: Diagnosis not present

## 2019-11-13 DIAGNOSIS — Z111 Encounter for screening for respiratory tuberculosis: Secondary | ICD-10-CM | POA: Diagnosis not present

## 2019-11-13 MED ORDER — CIMZIA PREFILLED 2 X 200 MG/ML ~~LOC~~ KIT: PACK | SUBCUTANEOUS | 0 refills | Status: DC

## 2019-11-13 NOTE — Patient Instructions (Signed)
Standing Labs We placed an order today for your standing lab work.    Please come back and get your standing labs in May and every 3 months   We have open lab daily Monday through Thursday from 8:30-12:30 PM and 1:30-4:30 PM and Friday from 8:30-12:30 PM and 1:30-4:00 PM at the office of Dr. Tzipora Mcinroy.   You may experience shorter wait times on Monday and Friday afternoons. The office is located at [address], [address], [place], [place] [address] No appointment is necessary.   Labs are drawn by [facility].  You may receive a bill from [facility] for your lab work.  If you wish to have your labs drawn at another location, please call the office 24 hours in advance to send orders.  If you have any questions regarding directions or hours of operation,  please call [phone].   Just as a reminder please drink plenty of water prior to coming for your lab work. Thanks!   

## 2019-11-13 NOTE — Telephone Encounter (Signed)
Please refill Cimza for patient. Patient needs it sent to [facility] this time. (doctor should have pharmacy info, per pt). Doctor talked to her about sending in refill at her appointment this am, but it has not been sent in as of yet. Patient wants to make sure that will be do.

## 2019-11-13 NOTE — Telephone Encounter (Signed)
Last Visit: 11/13/2019 Next Visit: 02/05/2020 Labs: 11/13/2019 (not resulted yet) TB Gold: 11/13/2019 (not resulted yet)  Okay to refill per Dr. Estanislado Pandy.   Advised patient refill of cimzia has been sent to the pharmacy, patient verbalized understanding.

## 2019-11-14 ENCOUNTER — Telehealth: Payer: Self-pay | Admitting: Pharmacist

## 2019-11-14 ENCOUNTER — Other Ambulatory Visit: Payer: Self-pay

## 2019-11-14 DIAGNOSIS — E559 Vitamin D deficiency, unspecified: Secondary | ICD-10-CM

## 2019-11-14 MED ORDER — CHOLECALCIFEROL 1.25 MG (50000 UT) PO TABS: 50000.0000 [IU] | ORAL_TABLET | ORAL | 0 refills | Status: DC

## 2019-11-14 NOTE — Telephone Encounter (Signed)
Received notification from [facility] regarding a prior authorization for [facility]. Authorization has been APPROVED from 11/14/19 to 11/12/22.   Key# [id number]

## 2019-11-14 NOTE — Progress Notes (Signed)
White cell count has decreased.  She is taking sulfasalazine intermittently.  She should discontinue sulfasalazine.  We will check labs in 3 months as scheduled.  Vitamin D is very low.  She should be on vitamin D 50,000 units twice a week for 3 months.  Recheck vitamin D level in 3 months after finishing the course.  She will need maintenance therapy after that.

## 2019-11-14 NOTE — Telephone Encounter (Signed)
Received PA request from [facility].  Please submit PA via covermymeds.   Mariella Saa, PharmD, [place], [place] Clinical Specialty Pharmacist [phone]  11/14/2019 9:03 AM

## 2019-11-15 LAB — CBC WITH DIFFERENTIAL/PLATELET
Absolute Monocytes: 344 cells/uL (ref 200–950)
Basophils Absolute: 40 cells/uL (ref 0–200)
Basophils Relative: 1.3 %
Eosinophils Absolute: 152 cells/uL (ref 15–500)
Eosinophils Relative: 4.9 %
HCT: 37.5 % (ref 35.0–45.0)
Hemoglobin: 12.6 g/dL (ref 11.7–15.5)
Lymphs Abs: 1717 cells/uL (ref 850–3900)
MCH: 30.5 pg (ref 27.0–33.0)
MCHC: 33.6 g/dL (ref 32.0–36.0)
MCV: 90.8 fL (ref 80.0–100.0)
MPV: 9.8 fL (ref 7.5–12.5)
Monocytes Relative: 11.1 %
Neutro Abs: 846 cells/uL — ABNORMAL LOW (ref 1500–7800)
Neutrophils Relative %: 27.3 %
Platelets: 236 10*3/uL (ref 140–400)
RBC: 4.13 10*6/uL (ref 3.80–5.10)
RDW: 11.8 % (ref 11.0–15.0)
Total Lymphocyte: 55.4 %
WBC: 3.1 10*3/uL — ABNORMAL LOW (ref 3.8–10.8)

## 2019-11-15 LAB — COMPLETE METABOLIC PANEL WITH GFR
AG Ratio: 1.6 (calc) (ref 1.0–2.5)
ALT: 10 U/L (ref 6–29)
AST: 13 U/L (ref 10–30)
Albumin: 4.4 g/dL (ref 3.6–5.1)
Alkaline phosphatase (APISO): 46 U/L (ref 31–125)
BUN: 11 mg/dL (ref 7–25)
CO2: 28 mmol/L (ref 20–32)
Calcium: 9.5 mg/dL (ref 8.6–10.2)
Chloride: 104 mmol/L (ref 98–110)
Creat: 0.73 mg/dL (ref 0.50–1.10)
GFR, Est African American: 125 mL/min/{1.73_m2} (ref >=60)
GFR, Est Non African American: 107 mL/min/{1.73_m2} (ref >=60)
Globulin: 2.7 g/dL (calc) (ref 1.9–3.7)
Glucose, Bld: 81 mg/dL (ref 65–99)
Potassium: 4 mmol/L (ref 3.5–5.3)
Sodium: 139 mmol/L (ref 135–146)
Total Bilirubin: 0.3 mg/dL (ref 0.2–1.2)
Total Protein: 7.1 g/dL (ref 6.1–8.1)

## 2019-11-15 LAB — VITAMIN D 25 HYDROXY (VIT D DEFICIENCY, FRACTURES): Vit D, 25-Hydroxy: 12 ng/mL — ABNORMAL LOW (ref 30–100)

## 2019-11-15 LAB — QUANTIFERON-TB GOLD PLUS
Mitogen-NIL: >10 IU/mL
NIL: 0.06 IU/mL
QuantiFERON-TB Gold Plus: NEGATIVE
TB1-NIL: <0 IU/mL
TB2-NIL: <0 IU/mL

## 2019-11-20 ENCOUNTER — Telehealth: Payer: Self-pay | Admitting: Rheumatology

## 2019-11-20 NOTE — Telephone Encounter (Signed)
Called [facility], rep stated they were not getting a paid claim. When I asked what quantity she was running, she was running a 3 month supply. Rep was able to get a paid claim when she ran a 1 month supply. She will call patient to schedule shipment.  Phone# [phone]  1:53 PM Beatriz Chancellor, CPhT

## 2019-11-20 NOTE — Telephone Encounter (Signed)
Patient called stating she spoke with a pharmacist at [facility] who stated they are still waiting for paperwork from Dr. Estanislado Pandy before they can refill her prescription of Cimzia.  Patient is requesting a return call.

## 2019-11-29 DIAGNOSIS — N979 Female infertility, unspecified: Secondary | ICD-10-CM | POA: Diagnosis not present

## 2019-11-29 DIAGNOSIS — Z319 Encounter for procreative management, unspecified: Secondary | ICD-10-CM | POA: Diagnosis not present

## 2019-12-06 DIAGNOSIS — Z113 Encounter for screening for infections with a predominantly sexual mode of transmission: Secondary | ICD-10-CM | POA: Diagnosis not present

## 2019-12-06 DIAGNOSIS — Z31430 Encounter of female for testing for genetic disease carrier status for procreative management: Secondary | ICD-10-CM | POA: Diagnosis not present

## 2019-12-06 DIAGNOSIS — Z3141 Encounter for fertility testing: Secondary | ICD-10-CM | POA: Diagnosis not present

## 2019-12-06 DIAGNOSIS — E288 Other ovarian dysfunction: Secondary | ICD-10-CM | POA: Diagnosis not present

## 2019-12-06 DIAGNOSIS — Z319 Encounter for procreative management, unspecified: Secondary | ICD-10-CM | POA: Diagnosis not present

## 2019-12-06 DIAGNOSIS — N8500 Endometrial hyperplasia, unspecified: Secondary | ICD-10-CM | POA: Diagnosis not present

## 2019-12-21 DIAGNOSIS — N971 Female infertility of tubal origin: Secondary | ICD-10-CM | POA: Diagnosis not present

## 2019-12-21 DIAGNOSIS — Z3183 Encounter for assisted reproductive fertility procedure cycle: Secondary | ICD-10-CM | POA: Diagnosis not present

## 2019-12-21 DIAGNOSIS — Z113 Encounter for screening for infections with a predominantly sexual mode of transmission: Secondary | ICD-10-CM | POA: Diagnosis not present

## 2019-12-25 ENCOUNTER — Encounter: Payer: Self-pay | Admitting: Rheumatology

## 2019-12-26 DIAGNOSIS — Z3141 Encounter for fertility testing: Secondary | ICD-10-CM | POA: Diagnosis not present

## 2020-01-03 DIAGNOSIS — Z01419 Encounter for gynecological examination (general) (routine) without abnormal findings: Secondary | ICD-10-CM | POA: Diagnosis not present

## 2020-01-03 DIAGNOSIS — Z124 Encounter for screening for malignant neoplasm of cervix: Secondary | ICD-10-CM | POA: Diagnosis not present

## 2020-01-09 DIAGNOSIS — N7011 Chronic salpingitis: Secondary | ICD-10-CM | POA: Diagnosis not present

## 2020-01-09 DIAGNOSIS — Z3141 Encounter for fertility testing: Secondary | ICD-10-CM | POA: Diagnosis not present

## 2020-01-09 DIAGNOSIS — Z23 Encounter for immunization: Secondary | ICD-10-CM | POA: Diagnosis not present

## 2020-01-09 DIAGNOSIS — N858 Other specified noninflammatory disorders of uterus: Secondary | ICD-10-CM | POA: Diagnosis not present

## 2020-01-18 ENCOUNTER — Other Ambulatory Visit: Payer: Self-pay

## 2020-01-18 DIAGNOSIS — Z79899 Other long term (current) drug therapy: Secondary | ICD-10-CM

## 2020-01-18 DIAGNOSIS — M0579 Rheumatoid arthritis with rheumatoid factor of multiple sites without organ or systems involvement: Secondary | ICD-10-CM

## 2020-01-18 NOTE — Telephone Encounter (Signed)
Please advise patient to return to recheck lab work since she has discontinued SSZ.

## 2020-01-18 NOTE — Telephone Encounter (Signed)
Refill request received via fax from [facility] for cimzia.   Last Visit: 11/13/2019  Next Visit: 02/05/2020 Labs: 11/13/2019 White cell count has decreased. She is taking sulfasalazine intermittently. She should discontinue sulfasalazine.  TB Gold: 11/13/2019 negative   Okay to refill cimzia?

## 2020-01-19 MED ORDER — CIMZIA PREFILLED 2 X 200 MG/ML ~~LOC~~ KIT: PACK | SUBCUTANEOUS | 0 refills | Status: DC

## 2020-01-19 NOTE — Telephone Encounter (Signed)
Please give only 30-day supply of Cimzia.  Patient needs labs to monitor for neutropenia.

## 2020-01-19 NOTE — Telephone Encounter (Signed)
Attempted to contact patient and left message on machine to advise patient to return to recheck lab work since she has discontinued SSZ. Lab orders are in place.   Okay to refill cimzia?

## 2020-01-29 NOTE — Progress Notes (Signed)
Office Visit Note  Patient: Meghan Arnold             Date of Birth: [birth year]           MRN: [id number]             PCP: Patient, No Pcp Per Referring: No ref. provider found Visit Date: 02/05/2020 Occupation: @GUAROCC @  Subjective:  Medication monitoring   History of Present Illness: Meghan Arnold is a 34 y.o. female with history of seropositive rheumatoid arthritis and Raynaud's syndrome.  Patient is on Cimzia 200 mg subcutaneous injections every 14 days.  She has not missed any doses of Cimzia recently.  She denies any recent infections.  She has not received the COVID-19 vaccination yet.  She denies any recent rheumatoid arthritis flares.  She denies any increased joint pain or joint swelling since discontinuing sulfasalazine after a white blood cell count.  She denies any joint pain, joint swelling, joint stiffness at this time.  She denies any active symptoms of Raynaud's recently.  She denies any digital ulcerations.   Activities of Daily Living:  Patient reports morning stiffness for 0 none.   Patient Denies nocturnal pain.  Difficulty dressing/grooming: Denies Difficulty climbing stairs: Denies Difficulty getting out of chair: Denies Difficulty using hands for taps, buttons, cutlery, and/or writing: Denies  Review of Systems  Constitutional: Negative for fatigue.  HENT: Negative for mouth sores, mouth dryness and nose dryness.   Eyes: Negative for pain, visual disturbance and dryness.  Respiratory: Negative for cough, hemoptysis, shortness of breath and difficulty breathing.   Cardiovascular: Negative for chest pain, palpitations, hypertension and swelling in legs/feet.  Gastrointestinal: Negative for blood in stool, constipation and diarrhea.  Endocrine: Negative for excessive thirst and increased urination.  Genitourinary: Negative for difficulty urinating and painful urination.  Musculoskeletal: Negative for arthralgias, joint pain, joint swelling, myalgias,  muscle weakness, morning stiffness, muscle tenderness and myalgias.  Skin: Negative for color change, pallor, rash, hair loss, nodules/bumps, skin tightness, ulcers and sensitivity to sunlight.  Allergic/Immunologic: Negative for susceptible to infections.  Neurological: Negative for dizziness, numbness, headaches and weakness.  Hematological: Negative for bruising/bleeding tendency and swollen glands.  Psychiatric/Behavioral: Negative for depressed mood and sleep disturbance. The patient is not nervous/anxious.     PMFS History:  Patient Active Problem List   Diagnosis Date Noted  . Rheumatoid arthritis involving multiple sites with positive rheumatoid factor ([name]) 12/03/2017  . High risk medication use 12/03/2017  . Raynaud's disease without gangrene 12/03/2017  . Vitamin D deficiency 12/03/2017  . Infertility counseling 07/02/2017  . Fertility testing 05/04/2017    Past Medical History:  Diagnosis Date  . Benign tumor of ovary 2013  . Chlamydia   . Family history of breast cancer   . Raynaud's phenomenon without gangrene    mgmt rheum Dr Annalee Genta  . Rheumatoid arthritis ([place]) 2017  . Wears contact lenses     Family History  Problem Relation Age of Onset  . Colon cancer Maternal Grandfather   . Breast cancer Maternal Grandmother 72  . Breast cancer Other 47  . Breast cancer Cousin 40   Past Surgical History:  Procedure Laterality Date  . LAPAROSCOPIC SALPINGOOPHERECTOMY  2013   done by dr. Kerin Perna , believes right side laterality   . LAPAROSCOPIC UNILATERAL SALPINGECTOMY Right 12/01/2017   Procedure: LAPAROSCOPIC LYSIS OF ADHESIONS, RIGHT TUBAL OVARIOLYSIS, RIGHT OVARIAN CYST ASPIRATION,CHROMOPERTUBATION, RIGHT FIMBRIOPLASTY;  Surgeon: Governor Specking, MD;  Location: [facility];  Service: Gynecology;  Laterality: Right;  . LAPAROSCOPY    . OVARIAN CYST REMOVAL  08/2012   dr Glennon Mac at [facility] ob[facility]yn in [place] ; has abdominal insision    Social  History   Social History Narrative  . Not on file   Immunization History  Administered Date(s) Administered  . Tdap 08/11/2016     Objective: Vital Signs: BP 118/74 (BP Location: Left Arm, Patient Position: Sitting, Cuff Size: Normal)   Pulse 74   Resp 14   Ht 5\' 5"  (1.651 m)   Wt 164 lb (74.4 kg)   BMI 27.29 kg/m    Physical Exam Vitals and nursing note reviewed.  Constitutional:      Appearance: She is well-developed.  HENT:     Head: Normocephalic and atraumatic.  Eyes:     Conjunctiva/sclera: Conjunctivae normal.  Pulmonary:     Effort: Pulmonary effort is normal.  Abdominal:     General: Bowel sounds are normal.     Palpations: Abdomen is soft.  Musculoskeletal:     Cervical back: Normal range of motion.  Lymphadenopathy:     Cervical: No cervical adenopathy.  Skin:    General: Skin is warm and dry.     Capillary Refill: Capillary refill takes less than 2 seconds.  Neurological:     Mental Status: She is alert and oriented to person, place, and time.  Psychiatric:        Behavior: Behavior normal.      Musculoskeletal Exam: C-spine, thoracic spine, lumbar spine have good range of motion.  Shoulder joints, elbow joints, wrist joints, MCPs, PIPs and DIPs good range of motion no synovitis.  She synovial thickening of bilateral first MCP joints.  Limited extension of both wrist joints but no tenderness or synovitis was noted.  Hip joints have good range of motion with no discomfort.  Knee joints and ankle joints have good range of motion with no discomfort.  No warmth or effusion of knee joints noted.  No tenderness of MTP joints.  CDAI Exam: CDAI Score: 0.2  Patient Global: 1 mm; Provider Global: 1 mm Swollen: 0 ; Tender: 0  Joint Exam 02/05/2020   No joint exam has been documented for this visit   There is currently no information documented on the homunculus. Go to the Rheumatology activity and complete the homunculus joint exam.  Investigation: No  additional findings.  Imaging: No results found.  Recent Labs: Lab Results  Component Value Date   WBC 3.1 (L) 11/13/2019   HGB 12.6 11/13/2019   PLT 236 11/13/2019   NA 139 11/13/2019   K 4.0 11/13/2019   CL 104 11/13/2019   CO2 28 11/13/2019   GLUCOSE 81 11/13/2019   BUN 11 11/13/2019   CREATININE 0.73 11/13/2019   BILITOT 0.3 11/13/2019   AST 13 11/13/2019   ALT 10 11/13/2019   PROT 7.1 11/13/2019   CALCIUM 9.5 11/13/2019   GFRAA 125 11/13/2019   QFTBGOLDPLUS NEGATIVE 11/13/2019    Speciality Comments: No specialty comments available.  Procedures:  No procedures performed Allergies: Patient has no known allergies.   Assessment / Plan:     Visit Diagnoses: Rheumatoid arthritis involving multiple sites with positive rheumatoid factor (HCC) - Positive RF and positive anti-CCP , erosive changes in hands and feet -She has no synovitis on exam.  She has synovial thickening of bilateral first MCP joints but no tenderness or inflammation was noted.  She has not had any recent rheumatoid arthritis flares.  She is clinically doing  well on Cimzia 200 mg obvious injections every 14 days.  She has not missed any doses of Cimzia.  She discontinued sulfasalazine in February 2021 due to low white blood cell count.  She has not noticed any increased joint pain or inflammation since discontinuing sulfasalazine.  She is not experiencing any morning stiffness or nocturnal pain recently.  She will continue on Cimzia 200 mg obvious injections every 14 days as monotherapy.  She was advised to notify us if she develops increased joint pain or joint swelling.  She will follow-up in the office in 5 months.  High risk medication use - Cimzia 200 mg every 14 days. D/c SSZ-low wbc count.  CBC and CMP were drawn on 11/13/2019.  White blood cell count was 3.1 at that time.  She is due to update lab work today.  Orders for CBC and CMP were released.  She will follow up for lab work in August and every 3 months  to monitor for drug toxicity.  TB gold was negative on 11/13/2019.  She has not had any recent infections.  She has not received the COVID-19 vaccination yet.- Plan: COMPLETE METABOLIC PANEL WITH GFR, CBC with Differential/Platelet  Raynaud's disease without gangrene: Not currently active.  No digital ulcerations or signs of gangrene.   Vitamin D deficiency: She is taking vitamin D 50,000 units twice weekly.  Her vitamin D was 12 on 11/13/2019.  Orders: Orders Placed This Encounter  Procedures  . COMPLETE METABOLIC PANEL WITH GFR  . CBC with Differential/Platelet   No orders of the defined types were placed in this encounter.     Follow-Up Instructions: Return in about 5 months (around 07/07/2020) for Rheumatoid arthritis.   Hazel Sams, PA-C  I examined and evaluated the patient with Hazel Sams PA.  Patient continues to have some synovial thickening but no synovitis was noted on my exam.  She is doing well on Cimzia monotherapy.  We will continue current regimen.  She states she was not taking sulfasalazine on a regular basis previously.  The plan of care was discussed as noted above.  Bo Merino, MD  Note - This record has been created using Editor, commissioning.  Chart creation errors have been sought, but may not always  have been located. Such creation errors do not reflect on  the standard of medical care.

## 2020-01-30 DIAGNOSIS — Z319 Encounter for procreative management, unspecified: Secondary | ICD-10-CM | POA: Diagnosis not present

## 2020-01-30 DIAGNOSIS — E288 Other ovarian dysfunction: Secondary | ICD-10-CM | POA: Diagnosis not present

## 2020-02-05 ENCOUNTER — Other Ambulatory Visit: Payer: Self-pay

## 2020-02-05 ENCOUNTER — Encounter: Payer: Self-pay | Admitting: Rheumatology

## 2020-02-05 ENCOUNTER — Ambulatory Visit (INDEPENDENT_AMBULATORY_CARE_PROVIDER_SITE_OTHER): Payer: BC Managed Care – PPO | Admitting: Rheumatology

## 2020-02-05 VITALS — BP 118/74 | HR 74 | Resp 14 | Ht 65.0 in | Wt 164.0 lb

## 2020-02-05 DIAGNOSIS — Z79899 Other long term (current) drug therapy: Secondary | ICD-10-CM | POA: Diagnosis not present

## 2020-02-05 DIAGNOSIS — M0579 Rheumatoid arthritis with rheumatoid factor of multiple sites without organ or systems involvement: Secondary | ICD-10-CM | POA: Diagnosis not present

## 2020-02-05 DIAGNOSIS — I7300 Raynaud's syndrome without gangrene: Secondary | ICD-10-CM | POA: Diagnosis not present

## 2020-02-05 DIAGNOSIS — E559 Vitamin D deficiency, unspecified: Secondary | ICD-10-CM | POA: Diagnosis not present

## 2020-02-05 LAB — CBC WITH DIFFERENTIAL/PLATELET
Absolute Monocytes: 538 cells/uL (ref 200–950)
Basophils Absolute: 72 cells/uL (ref 0–200)
Basophils Relative: 1.5 %
Eosinophils Absolute: 456 cells/uL (ref 15–500)
Eosinophils Relative: 9.5 %
HCT: 37.1 % (ref 35.0–45.0)
Hemoglobin: 12.3 g/dL (ref 11.7–15.5)
Lymphs Abs: 2414 cells/uL (ref 850–3900)
MCH: 30.3 pg (ref 27.0–33.0)
MCHC: 33.2 g/dL (ref 32.0–36.0)
MCV: 91.4 fL (ref 80.0–100.0)
MPV: 9.6 fL (ref 7.5–12.5)
Monocytes Relative: 11.2 %
Neutro Abs: 1320 cells/uL — ABNORMAL LOW (ref 1500–7800)
Neutrophils Relative %: 27.5 %
Platelets: 262 10*3/uL (ref 140–400)
RBC: 4.06 10*6/uL (ref 3.80–5.10)
RDW: 11.7 % (ref 11.0–15.0)
Total Lymphocyte: 50.3 %
WBC: 4.8 10*3/uL (ref 3.8–10.8)

## 2020-02-05 LAB — COMPLETE METABOLIC PANEL WITH GFR
AG Ratio: 1.7 (calc) (ref 1.0–2.5)
ALT: 9 U/L (ref 6–29)
AST: 12 U/L (ref 10–30)
Albumin: 4.5 g/dL (ref 3.6–5.1)
Alkaline phosphatase (APISO): 46 U/L (ref 31–125)
BUN: 11 mg/dL (ref 7–25)
CO2: 28 mmol/L (ref 20–32)
Calcium: 9.7 mg/dL (ref 8.6–10.2)
Chloride: 103 mmol/L (ref 98–110)
Creat: 0.93 mg/dL (ref 0.50–1.10)
GFR, Est African American: 92 mL/min/{1.73_m2} (ref >=60)
GFR, Est Non African American: 80 mL/min/{1.73_m2} (ref >=60)
Globulin: 2.6 g/dL (calc) (ref 1.9–3.7)
Glucose, Bld: 72 mg/dL (ref 65–99)
Potassium: 4.4 mmol/L (ref 3.5–5.3)
Sodium: 138 mmol/L (ref 135–146)
Total Bilirubin: 0.3 mg/dL (ref 0.2–1.2)
Total Protein: 7.1 g/dL (ref 6.1–8.1)

## 2020-02-06 NOTE — Progress Notes (Signed)
CMP WNL.  Absolute neutrophils are low but trending up.  WBC count is WNL.  Rest of CBC WNL

## 2020-02-14 DIAGNOSIS — N971 Female infertility of tubal origin: Secondary | ICD-10-CM | POA: Diagnosis not present

## 2020-02-14 DIAGNOSIS — Z3183 Encounter for assisted reproductive fertility procedure cycle: Secondary | ICD-10-CM | POA: Diagnosis not present

## 2020-02-14 DIAGNOSIS — Z113 Encounter for screening for infections with a predominantly sexual mode of transmission: Secondary | ICD-10-CM | POA: Diagnosis not present

## 2020-02-28 ENCOUNTER — Other Ambulatory Visit: Payer: Self-pay | Admitting: *Deleted

## 2020-02-28 DIAGNOSIS — M0579 Rheumatoid arthritis with rheumatoid factor of multiple sites without organ or systems involvement: Secondary | ICD-10-CM

## 2020-02-28 MED ORDER — CIMZIA PREFILLED 2 X 200 MG/ML ~~LOC~~ KIT: PACK | SUBCUTANEOUS | 0 refills | Status: DC

## 2020-02-28 NOTE — Telephone Encounter (Signed)
Refill request received via fax  Last Visit: 02/05/2020 Next Visit: 07/08/2020 Labs: 02/05/2020 CMP WNL. Absolute neutrophils are low but trending up. WBC count is WNL. Rest of CBC WNL TB Gold: 11/13/2019 Neg   Current Dose per office note 02/05/2020: Cimzia 200 mg SQ injections every 14 days   Okay to refill per Dr. Estanislado Pandy

## 2020-03-01 DIAGNOSIS — Z3200 Encounter for pregnancy test, result unknown: Secondary | ICD-10-CM | POA: Diagnosis not present

## 2020-03-04 DIAGNOSIS — Z3200 Encounter for pregnancy test, result unknown: Secondary | ICD-10-CM | POA: Diagnosis not present

## 2020-03-18 DIAGNOSIS — Z3200 Encounter for pregnancy test, result unknown: Secondary | ICD-10-CM | POA: Diagnosis not present

## 2020-04-02 DIAGNOSIS — O0900 Supervision of pregnancy with history of infertility, unspecified trimester: Secondary | ICD-10-CM | POA: Diagnosis not present

## 2020-04-05 ENCOUNTER — Telehealth: Payer: Self-pay | Admitting: Rheumatology

## 2020-04-05 NOTE — Telephone Encounter (Signed)
Patient request refill on Cimzia sent to [facility].

## 2020-04-05 NOTE — Telephone Encounter (Signed)
Patient advised we sent a 90 day supply to pharmacy on 02/28/2020. Patient states she will contact the pharmacy.

## 2020-04-25 DIAGNOSIS — Z3400 Encounter for supervision of normal first pregnancy, unspecified trimester: Secondary | ICD-10-CM | POA: Diagnosis not present

## 2020-04-30 ENCOUNTER — Encounter: Payer: Self-pay | Admitting: Rheumatology

## 2020-05-02 DIAGNOSIS — Z3401 Encounter for supervision of normal first pregnancy, first trimester: Secondary | ICD-10-CM | POA: Diagnosis not present

## 2020-05-02 DIAGNOSIS — Z113 Encounter for screening for infections with a predominantly sexual mode of transmission: Secondary | ICD-10-CM | POA: Diagnosis not present

## 2020-06-24 NOTE — Progress Notes (Signed)
Office Visit Note  Patient: Meghan Arnold             Date of Birth: [birth year]           MRN: [id number]             PCP: Patient, No Pcp Per Referring: No ref. provider found Visit Date: 07/08/2020 Occupation: @GUAROCC @  Subjective:   Pain of the Left Knee and Medication Management (Patient quit taking Cimzia due to pregnancy )   History of Present Illness: Meghan Arnold is a 34 y.o. female with seropositive rheumatoid arthritis.  She was treated with Cimzia for rheumatoid arthritis as she was planning pregnancy.  She took Cimzia until she got pregnant.  Then she decided to come off the Cimzia.  She had been doing well until recently.  She states for the last 1 month she has been having pain and discomfort in her left knee.  None of the other joints are painful.  Activities of Daily Living:  Patient reports morning stiffness for 0 minutes.   Patient Denies nocturnal pain.  Difficulty dressing/grooming: Denies Difficulty climbing stairs: Reports Difficulty getting out of chair: Denies Difficulty using hands for taps, buttons, cutlery, and/or writing: Denies  Review of Systems  Constitutional: Negative for fatigue.  HENT: Negative for mouth sores, mouth dryness and nose dryness.   Eyes: Negative for pain, redness, itching and dryness.  Respiratory: Negative for cough, hemoptysis, shortness of breath and difficulty breathing.   Cardiovascular: Negative for chest pain, palpitations and swelling in legs/feet.  Gastrointestinal: Negative for abdominal pain, blood in stool, constipation and diarrhea.  Endocrine: Negative for increased urination.  Genitourinary: Negative for painful urination.  Musculoskeletal: Positive for arthralgias, joint pain and joint swelling. Negative for myalgias, muscle weakness, morning stiffness, muscle tenderness and myalgias.  Skin: Negative for color change, rash and redness.  Allergic/Immunologic: Negative for susceptible to infections.    Neurological: Negative for dizziness, headaches and weakness.  Hematological: Negative for swollen glands.  Psychiatric/Behavioral: Negative for sleep disturbance.    PMFS History:  Patient Active Problem List   Diagnosis Date Noted  . Rheumatoid arthritis involving multiple sites with positive rheumatoid factor ([place]) 12/03/2017  . High risk medication use 12/03/2017  . Raynaud's disease without gangrene 12/03/2017  . Vitamin D deficiency 12/03/2017  . Infertility counseling 07/02/2017  . Fertility testing 05/04/2017    Past Medical History:  Diagnosis Date  . Benign tumor of ovary 2013  . Chlamydia   . Family history of breast cancer   . Raynaud's phenomenon without gangrene    mgmt rheum Dr Annalee Genta  . Rheumatoid arthritis ([place]) 2017  . Wears contact lenses     Family History  Problem Relation Age of Onset  . Colon cancer Maternal Grandfather   . Breast cancer Maternal Grandmother 72  . Breast cancer Other 29  . Breast cancer Cousin 40   Past Surgical History:  Procedure Laterality Date  . LAPAROSCOPIC SALPINGOOPHERECTOMY  2013   done by dr. Kerin Perna , believes right side laterality   . LAPAROSCOPIC UNILATERAL SALPINGECTOMY Right 12/01/2017   Procedure: LAPAROSCOPIC LYSIS OF ADHESIONS, RIGHT TUBAL OVARIOLYSIS, RIGHT OVARIAN CYST ASPIRATION,CHROMOPERTUBATION, RIGHT FIMBRIOPLASTY;  Surgeon: Governor Specking, MD;  Location: [facility];  Service: Gynecology;  Laterality: Right;  . LAPAROSCOPY    . OVARIAN CYST REMOVAL  08/2012   dr Glennon Mac at [facility] obgyn in [place] ; has abdominal insision    Social History   Social History Narrative  .  Not on file   Immunization History  Administered Date(s) Administered  . Tdap 08/11/2016     Objective: Vital Signs: BP 114/67 (BP Location: Left Arm, Patient Position: Sitting, Cuff Size: Small)   Pulse 82   Ht 5\' 5"  (1.651 m)   Wt 173 lb 3.2 oz (78.6 kg)   BMI 28.82 kg/m    Physical Exam Vitals  and nursing note reviewed.  Constitutional:      Appearance: She is well-developed.  HENT:     Head: Normocephalic and atraumatic.  Eyes:     Conjunctiva/sclera: Conjunctivae normal.  Cardiovascular:     Rate and Rhythm: Normal rate and regular rhythm.     Heart sounds: Normal heart sounds.  Pulmonary:     Effort: Pulmonary effort is normal.     Breath sounds: Normal breath sounds.  Abdominal:     General: Bowel sounds are normal.     Palpations: Abdomen is soft.  Musculoskeletal:     Cervical back: Normal range of motion.  Lymphadenopathy:     Cervical: No cervical adenopathy.  Skin:    General: Skin is warm and dry.     Capillary Refill: Capillary refill takes less than 2 seconds.  Neurological:     Mental Status: She is alert and oriented to person, place, and time.  Psychiatric:        Behavior: Behavior normal.      Musculoskeletal Exam: C-spine, thoracic and lumbar spine with good range of motion.  Shoulder joints, elbow joints, wrist joints, MCPs and PIPs with good range of motion.  She has synovial thickening over bilateral first second and third MCPs but no synovitis was noted.  Hip joints and knee joints with good range of motion.  She has some discomfort range of motion of her left knee joint without any warmth swelling or effusion.  There was no tenderness over ankles or MTPs.  CDAI Exam: CDAI Score: 1.2  Patient Global: 1 mm; Provider Global: 1 mm Swollen: 0 ; Tender: 1  Joint Exam 07/08/2020      Right  Left  Knee      Tender     Investigation: No additional findings.  Imaging: No results found.  Recent Labs: Lab Results  Component Value Date   WBC 4.8 02/05/2020   HGB 12.3 02/05/2020   PLT 262 02/05/2020   NA 138 02/05/2020   K 4.4 02/05/2020   CL 103 02/05/2020   CO2 28 02/05/2020   GLUCOSE 72 02/05/2020   BUN 11 02/05/2020   CREATININE 0.93 02/05/2020   BILITOT 0.3 02/05/2020   AST 12 02/05/2020   ALT 9 02/05/2020   PROT 7.1 02/05/2020    CALCIUM 9.7 02/05/2020   GFRAA 92 02/05/2020   QFTBGOLDPLUS NEGATIVE 11/13/2019    Speciality Comments: No specialty comments available.  Procedures:  No procedures performed Allergies: Patient has no known allergies.   Assessment / Plan:     Visit Diagnoses: Rheumatoid arthritis involving multiple sites with positive rheumatoid factor (HCC) - Positive RF and positive anti-CCP , erosive changes in hands and feet.  She has been off Cimzia since she has been pregnant.  She states she has been experiencing some discomfort in her left knee for the last 1 month.  The symptoms have been better for the last few days.  None of the other joints are painful.  She has some stiffness in her hands.  High risk medication use -she was placed on Cimzia 200 mg every 14 days.  She discontinued Cimzia when she became pregnant.  She does not want to take Cimzia at this point until she delivers the baby.  Her EDD is mid February.  D/c SSZ-low wbc count.  She plans to discharge back on Cimzia after the baby is born.  Raynaud's disease without gangrene-currently not symptomatic.  Chronic pain of left knee-she has been experiencing pain in her left knee.  No warmth swelling effusion was noted.  The symptoms are improving.  I have advised her to contact us in case her symptoms get worse then she can get a cortisone injection.  She will also take permission from her GYN.  Vitamin D deficiency  [redacted] weeks gestation of pregnancy-doing well.  She is not vaccinated against COVID-19.  Have advised her to contact her GYN discuss possible use of COVID-19 vaccination during pregnancy.  Use of mask, social distancing and hand hygiene was discussed.  Orders: No orders of the defined types were placed in this encounter.  No orders of the defined types were placed in this encounter.    Follow-Up Instructions: Return in about 5 months (around 12/06/2020) for Rheumatoid arthritis.   Bo Merino, MD  Note - This  record has been created using Editor, commissioning.  Chart creation errors have been sought, but may not always  have been located. Such creation errors do not reflect on  the standard of medical care.

## 2020-07-01 DIAGNOSIS — E559 Vitamin D deficiency, unspecified: Secondary | ICD-10-CM | POA: Diagnosis not present

## 2020-07-01 DIAGNOSIS — Z36 Encounter for antenatal screening for chromosomal anomalies: Secondary | ICD-10-CM | POA: Diagnosis not present

## 2020-07-01 DIAGNOSIS — Z3402 Encounter for supervision of normal first pregnancy, second trimester: Secondary | ICD-10-CM | POA: Diagnosis not present

## 2020-07-01 DIAGNOSIS — O09512 Supervision of elderly primigravida, second trimester: Secondary | ICD-10-CM | POA: Diagnosis not present

## 2020-07-08 ENCOUNTER — Encounter: Payer: Self-pay | Admitting: Rheumatology

## 2020-07-08 ENCOUNTER — Other Ambulatory Visit: Payer: Self-pay

## 2020-07-08 ENCOUNTER — Ambulatory Visit (INDEPENDENT_AMBULATORY_CARE_PROVIDER_SITE_OTHER): Payer: BC Managed Care – PPO | Admitting: Rheumatology

## 2020-07-08 VITALS — BP 114/67 | HR 82 | Ht 65.0 in | Wt 173.2 lb

## 2020-07-08 DIAGNOSIS — G8929 Other chronic pain: Secondary | ICD-10-CM

## 2020-07-08 DIAGNOSIS — E559 Vitamin D deficiency, unspecified: Secondary | ICD-10-CM

## 2020-07-08 DIAGNOSIS — M0579 Rheumatoid arthritis with rheumatoid factor of multiple sites without organ or systems involvement: Secondary | ICD-10-CM

## 2020-07-08 DIAGNOSIS — I7300 Raynaud's syndrome without gangrene: Secondary | ICD-10-CM | POA: Diagnosis not present

## 2020-07-08 DIAGNOSIS — M25562 Pain in left knee: Secondary | ICD-10-CM

## 2020-07-08 DIAGNOSIS — Z3A22 22 weeks gestation of pregnancy: Secondary | ICD-10-CM

## 2020-07-08 DIAGNOSIS — Z79899 Other long term (current) drug therapy: Secondary | ICD-10-CM | POA: Diagnosis not present

## 2020-07-08 NOTE — Patient Instructions (Signed)
Journal for Nurse Practitioners, 15(4), 263-267. Retrieved July 04, 2018 from http://clinicalkey.com/nursing">  Knee Exercises Ask your health care provider which exercises are safe for you. Do exercises exactly as told by your health care provider and adjust them as directed. It is normal to feel mild stretching, pulling, tightness, or discomfort as you do these exercises. Stop right away if you feel sudden pain or your pain gets worse. Do not begin these exercises until told by your health care provider. Stretching and range-of-motion exercises These exercises warm up your muscles and joints and improve the movement and flexibility of your knee. These exercises also help to relieve pain and swelling. Knee extension, prone 1. Lie on your abdomen (prone position) on a bed. 2. Place your left / right knee just beyond the edge of the surface so your knee is not on the bed. You can put a towel under your left / right thigh just above your kneecap for comfort. 3. Relax your leg muscles and allow gravity to straighten your knee (extension). You should feel a stretch behind your left / right knee. 4. Hold this position for __________ seconds. 5. Scoot up so your knee is supported between repetitions. Repeat __________ times. Complete this exercise __________ times a day. Knee flexion, active  1. Lie on your back with both legs straight. If this causes back discomfort, bend your left / right knee so your foot is flat on the floor. 2. Slowly slide your left / right heel back toward your buttocks. Stop when you feel a gentle stretch in the front of your knee or thigh (flexion). 3. Hold this position for __________ seconds. 4. Slowly slide your left / right heel back to the starting position. Repeat __________ times. Complete this exercise __________ times a day. Quadriceps stretch, prone  1. Lie on your abdomen on a firm surface, such as a bed or padded floor. 2. Bend your left / right knee and hold  your ankle. If you cannot reach your ankle or pant leg, loop a belt around your foot and grab the belt instead. 3. Gently pull your heel toward your buttocks. Your knee should not slide out to the side. You should feel a stretch in the front of your thigh and knee (quadriceps). 4. Hold this position for __________ seconds. Repeat __________ times. Complete this exercise __________ times a day. Hamstring, supine 1. Lie on your back (supine position). 2. Loop a belt or towel over the ball of your left / right foot. The ball of your foot is on the walking surface, right under your toes. 3. Straighten your left / right knee and slowly pull on the belt to raise your leg until you feel a gentle stretch behind your knee (hamstring). ? Do not let your knee bend while you do this. ? Keep your other leg flat on the floor. 4. Hold this position for __________ seconds. Repeat __________ times. Complete this exercise __________ times a day. Strengthening exercises These exercises build strength and endurance in your knee. Endurance is the ability to use your muscles for a long time, even after they get tired. Quadriceps, isometric This exercise stretches the muscles in front of your thigh (quadriceps) without moving your knee joint (isometric). 1. Lie on your back with your left / right leg extended and your other knee bent. Put a rolled towel or small pillow under your knee if told by your health care provider. 2. Slowly tense the muscles in the front of your left /   right thigh. You should see your kneecap slide up toward your hip or see increased dimpling just above the knee. This motion will push the back of the knee toward the floor. 3. For __________ seconds, hold the muscle as tight as you can without increasing your pain. 4. Relax the muscles slowly and completely. Repeat __________ times. Complete this exercise __________ times a day. Straight leg raises This exercise stretches the muscles in front  of your thigh (quadriceps) and the muscles that move your hips (hip flexors). 1. Lie on your back with your left / right leg extended and your other knee bent. 2. Tense the muscles in the front of your left / right thigh. You should see your kneecap slide up or see increased dimpling just above the knee. Your thigh may even shake a bit. 3. Keep these muscles tight as you raise your leg 4-6 inches (10-15 cm) off the floor. Do not let your knee bend. 4. Hold this position for __________ seconds. 5. Keep these muscles tense as you lower your leg. 6. Relax your muscles slowly and completely after each repetition. Repeat __________ times. Complete this exercise __________ times a day. Hamstring, isometric 1. Lie on your back on a firm surface. 2. Bend your left / right knee about __________ degrees. 3. Dig your left / right heel into the surface as if you are trying to pull it toward your buttocks. Tighten the muscles in the back of your thighs (hamstring) to "dig" as hard as you can without increasing any pain. 4. Hold this position for __________ seconds. 5. Release the tension gradually and allow your muscles to relax completely for __________ seconds after each repetition. Repeat __________ times. Complete this exercise __________ times a day. Hamstring curls If told by your health care provider, do this exercise while wearing ankle weights. Begin with __________ lb weights. Then increase the weight by 1 lb (0.5 kg) increments. Do not wear ankle weights that are more than __________ lb. 1. Lie on your abdomen with your legs straight. 2. Bend your left / right knee as far as you can without feeling pain. Keep your hips flat against the floor. 3. Hold this position for __________ seconds. 4. Slowly lower your leg to the starting position. Repeat __________ times. Complete this exercise __________ times a day. Squats This exercise strengthens the muscles in front of your thigh and knee  (quadriceps). 1. Stand in front of a table, with your feet and knees pointing straight ahead. You may rest your hands on the table for balance but not for support. 2. Slowly bend your knees and lower your hips like you are going to sit in a chair. ? Keep your weight over your heels, not over your toes. ? Keep your lower legs upright so they are parallel with the table legs. ? Do not let your hips go lower than your knees. ? Do not bend lower than told by your health care provider. ? If your knee pain increases, do not bend as low. 3. Hold the squat position for __________ seconds. 4. Slowly push with your legs to return to standing. Do not use your hands to pull yourself to standing. Repeat __________ times. Complete this exercise __________ times a day. Wall slides This exercise strengthens the muscles in front of your thigh and knee (quadriceps). 1. Lean your back against a smooth wall or door, and walk your feet out 18-24 inches (46-61 cm) from it. 2. Place your feet hip-width apart. 3.   Slowly slide down the wall or door until your knees bend __________ degrees. Keep your knees over your heels, not over your toes. Keep your knees in line with your hips. 4. Hold this position for __________ seconds. Repeat __________ times. Complete this exercise __________ times a day. Straight leg raises This exercise strengthens the muscles that rotate the leg at the hip and move it away from your body (hip abductors). 1. Lie on your side with your left / right leg in the top position. Lie so your head, shoulder, knee, and hip line up. You may bend your bottom knee to help you keep your balance. 2. Roll your hips slightly forward so your hips are stacked directly over each other and your left / right knee is facing forward. 3. Leading with your heel, lift your top leg 4-6 inches (10-15 cm). You should feel the muscles in your outer hip lifting. ? Do not let your foot drift forward. ? Do not let your knee  roll toward the ceiling. 4. Hold this position for __________ seconds. 5. Slowly return your leg to the starting position. 6. Let your muscles relax completely after each repetition. Repeat __________ times. Complete this exercise __________ times a day. Straight leg raises This exercise stretches the muscles that move your hips away from the front of the pelvis (hip extensors). 1. Lie on your abdomen on a firm surface. You can put a pillow under your hips if that is more comfortable. 2. Tense the muscles in your buttocks and lift your left / right leg about 4-6 inches (10-15 cm). Keep your knee straight as you lift your leg. 3. Hold this position for __________ seconds. 4. Slowly lower your leg to the starting position. 5. Let your leg relax completely after each repetition. Repeat __________ times. Complete this exercise __________ times a day. This information is not intended to replace advice given to you by your health care provider. Make sure you discuss any questions you have with your health care provider. Document Revised: 07/05/2018 Document Reviewed: 07/05/2018 Elsevier Patient Education  2020 [employer]vier Inc.  

## 2020-09-03 DIAGNOSIS — Z3492 Encounter for supervision of normal pregnancy, unspecified, second trimester: Secondary | ICD-10-CM | POA: Diagnosis not present

## 2020-09-03 DIAGNOSIS — O09513 Supervision of elderly primigravida, third trimester: Secondary | ICD-10-CM | POA: Diagnosis not present

## 2020-09-16 DIAGNOSIS — Z23 Encounter for immunization: Secondary | ICD-10-CM | POA: Diagnosis not present

## 2020-09-28 NOTE — L&D Delivery Note (Addendum)
Delivery Note Patient was found to be complete and +1 station. At 6:09 PM a viable and healthy female was delivered via Vaginal, Spontaneous (Presentation: LOA).  APGAR: 8,9 ; weight :  See infant's chart Placenta status:  Intact - sent to pathology.  Cord: 3 vessels   with the following complications: None.  Cord pH: Not collected  Anesthesia: Epidural Episiotomy:  NA Lacerations:  None Suture Repair: N/A Est. Blood Loss (mL):  500cc  After delivery of infant, pitocin was started.  Significant lower uterine segment atony was noted and bimanual massage performed. No perineal, vaginal, or cervical lacerations present. Persistent lower uterine segment atony was noted.  IV TXA 1g and Hemabate 276mcg x 1 dose administered. Her bladder was emptied with an in-and-out catheter and ~150cc drained.  Due to constant trickling, Methergine 0.2mg  x 1 dose given. Bimanual massage was performed and uterus and fundus were firm. Cytotec 1030mcg was placed per rectum. Hemostasis confirmed.  Mom to postpartum.  Baby to Couplet care / Skin to Skin.  Drema Dallas 11/06/2020, 6:33 PM

## 2020-10-01 DIAGNOSIS — O09513 Supervision of elderly primigravida, third trimester: Secondary | ICD-10-CM | POA: Diagnosis not present

## 2020-10-29 LAB — OB RESULTS CONSOLE GBS
GBS: NEGATIVE
GBS: POSITIVE

## 2020-10-31 ENCOUNTER — Encounter: Payer: Self-pay | Admitting: Rheumatology

## 2020-10-31 ENCOUNTER — Encounter (HOSPITAL_COMMUNITY): Payer: Self-pay | Admitting: *Deleted

## 2020-10-31 ENCOUNTER — Telehealth (HOSPITAL_COMMUNITY): Payer: Self-pay | Admitting: *Deleted

## 2020-10-31 NOTE — Telephone Encounter (Signed)
Please schedule an appointment to restart on Cimzia.  She will need labs prior to the Cimzia injection.

## 2020-10-31 NOTE — Telephone Encounter (Signed)
Preadmission screen  

## 2020-11-01 ENCOUNTER — Other Ambulatory Visit: Payer: Self-pay | Admitting: *Deleted

## 2020-11-01 ENCOUNTER — Telehealth (HOSPITAL_COMMUNITY): Payer: Self-pay | Admitting: *Deleted

## 2020-11-01 ENCOUNTER — Encounter (HOSPITAL_COMMUNITY): Payer: Self-pay | Admitting: *Deleted

## 2020-11-01 DIAGNOSIS — E559 Vitamin D deficiency, unspecified: Secondary | ICD-10-CM

## 2020-11-01 DIAGNOSIS — Z79899 Other long term (current) drug therapy: Secondary | ICD-10-CM

## 2020-11-01 NOTE — H&P (Signed)
HPI: 35 y.o. G1P0000 @ [redacted]w[redacted]d estimated gestational age (as dated by IVF transfer) presents for induction of labor for gestational HTN.  Leakage of fluid:  No Vaginal bleeding:  No Contractions:  No Fetal movement:  Yes  Prenatal care has been provided by Dr. Drema Dallas [facility])  ROS:  Denies fevers, chills, chest pain, visual changes, SOB, RUQ/epigastric pain, N/V, dysuria, hematuria, or sudden onset/worsening bilateral LE or facial edema.  Pregnancy complicated by: Gestational HTN (ruled in on 2/8) IVF pregnancy AMA Rheumatoid arthritis SMA carrier (FOB negative) GBS Positive (urine) Vitamin D deficiency History of left dermoid cyst s/p LSO (2012) History of right salpingo-oophorolysis and R fimbrioplasty (2019)   Prenatal Transfer Tool  Maternal Diabetes: No Genetic Screening: Declined Maternal Ultrasounds/Referrals: Normal Fetal Ultrasounds or other Referrals:  None Maternal Substance Abuse:  No Significant Maternal Medications:  None Significant Maternal Lab Results: Group B Strep positive   Prenatal Labs Blood type:  O Positive Antibody screen:  Negative CBC:  H/H 12/35 Rubella: Immune RPR:  Non-reactive Hep B:  Negative Hep C:  Negative GC/CT:  Negative Glucola:  105 (wnl)  Immunizations: Tdap: Given prenatally Flu: Declines  OBHx:  OB History    Gravida  1   Para  0   Term  0   Preterm  0   AB  0   Living  0     SAB  0   IAB  0   Ectopic  0   Multiple  0   Live Births  0          PMHx:  Rheumatoid arthritis, Vitamin D deficiency, History of infertility Meds:  PNV  Allergy:  No Known Allergies SurgHx: LSO, R salpingo-oophorolysis an dr fimbrioplasty SocHx:   Denies Tobacco, ETOH, illicit drugs  O: There were no vitals taken for this visit. Gen. AAOx3, NAD CV.  RRR  Resp. CTAB, no wheezes/rales/rhonchi Abd. Gravid, soft, non-tender throughout, no rebound/guarding Extr.  no bilateral LE edema, no calf tenderness  bilaterally SVE: 1/50/high, posterior, firm   Last [place] (11/05/20):  BPP 8/8, cephalic Last growth [place] (7/03):  EFW 50093G (18%), AAFV, cephalic, posterior placenta  Labs: see orders  A/P:  35 y.o. G1P0000 @ [redacted]w[redacted]d who presents for induction of labor for gestational HTN.  IOL - gestational HTN - Admit to L&D - Admit labs (CBC, T&S, COVID screen) - CEFM/Toco - Diet:  Clear liquids - IVF:  LR at 125cc/hour - VTE Prophylaxis:  SCDs - GBS Status:  Positive (noted on urine during early pregnancy) - PCN ordered - Presentation:  Cephalic by [place] today - Pain control:  Per patient request - Induction method:  Cytotec  AMA - Declined genetic screening this pregnancy - Normal FAS - ASA 81mg  daily for preeclampsia prophylaxis  IVF pregnancy - Last growth [place] EFW 2955g (29%), AAFV, cephalic (9/37)  Rheumatoid arthritis - Medications:  None now, previously on Cimzia injections 200mg  q14 days - Rheumatologist:  Dr. Estanislado Pandy ([facility])  SMA Carrier - FOB tested negative, pe rpatient  GBS positive - Noted on initial urine culture - PCN ordered  History of left dermoid cyst - S/p LSO 2012  History of R salpingo-oophorolysis and R fimbrioplasty - Noted  Drema Dallas, DO [phone] (office)

## 2020-11-01 NOTE — Telephone Encounter (Signed)
Preadmission screen  

## 2020-11-02 LAB — COMPLETE METABOLIC PANEL WITH GFR
AG Ratio: 1.2 (calc) (ref 1.0–2.5)
ALT: 11 U/L (ref 6–29)
AST: 15 U/L (ref 10–30)
Albumin: 3.5 g/dL — ABNORMAL LOW (ref 3.6–5.1)
Alkaline phosphatase (APISO): 132 U/L — ABNORMAL HIGH (ref 31–125)
BUN/Creatinine Ratio: 7 (calc) (ref 6–22)
BUN: 5 mg/dL — ABNORMAL LOW (ref 7–25)
CO2: 24 mmol/L (ref 20–32)
Calcium: 9.2 mg/dL (ref 8.6–10.2)
Chloride: 105 mmol/L (ref 98–110)
Creat: 0.68 mg/dL (ref 0.50–1.10)
GFR, Est African American: 131 mL/min/{1.73_m2} (ref >=60)
GFR, Est Non African American: 113 mL/min/{1.73_m2} (ref >=60)
Globulin: 2.9 g/dL (calc) (ref 1.9–3.7)
Glucose, Bld: 78 mg/dL (ref 65–99)
Potassium: 4.3 mmol/L (ref 3.5–5.3)
Sodium: 137 mmol/L (ref 135–146)
Total Bilirubin: 0.3 mg/dL (ref 0.2–1.2)
Total Protein: 6.4 g/dL (ref 6.1–8.1)

## 2020-11-02 LAB — CBC WITH DIFFERENTIAL/PLATELET
Absolute Monocytes: 430 cells/uL (ref 200–950)
Basophils Absolute: 20 cells/uL (ref 0–200)
Basophils Relative: 0.4 %
Eosinophils Absolute: 190 cells/uL (ref 15–500)
Eosinophils Relative: 3.8 %
HCT: 34 % — ABNORMAL LOW (ref 35.0–45.0)
Hemoglobin: 11.6 g/dL — ABNORMAL LOW (ref 11.7–15.5)
Lymphs Abs: 1295 cells/uL (ref 850–3900)
MCH: 30.5 pg (ref 27.0–33.0)
MCHC: 34.1 g/dL (ref 32.0–36.0)
MCV: 89.5 fL (ref 80.0–100.0)
MPV: 10 fL (ref 7.5–12.5)
Monocytes Relative: 8.6 %
Neutro Abs: 3065 cells/uL (ref 1500–7800)
Neutrophils Relative %: 61.3 %
Platelets: 219 10*3/uL (ref 140–400)
RBC: 3.8 10*6/uL (ref 3.80–5.10)
RDW: 12.6 % (ref 11.0–15.0)
Total Lymphocyte: 25.9 %
WBC: 5 10*3/uL (ref 3.8–10.8)

## 2020-11-02 LAB — VITAMIN D 25 HYDROXY (VIT D DEFICIENCY, FRACTURES): Vit D, 25-Hydroxy: 33 ng/mL (ref 30–100)

## 2020-11-02 NOTE — Progress Notes (Signed)
Alkaline phosphatase is elevated most likely due to pregnancy.  Mild anemia noted.  Vitamin D is normal now.  She should continue vitamin D 2000 units daily.

## 2020-11-04 ENCOUNTER — Telehealth: Payer: Self-pay | Admitting: Pharmacist

## 2020-11-04 NOTE — Telephone Encounter (Addendum)
Patient called requesting to re-start Cimzia prior to labor induction on 11/07/20.  Can we please make sure Meghan Arnold is still approved before I bring her in?  She has Medicaid now (in media tab)  Knox Saliva, PharmD, MPH Clinical Pharmacist (Rheumatology and Pulmonology)

## 2020-11-04 NOTE — Telephone Encounter (Signed)
Submitted a Prior Authorization request to [employer] [place] [employer] for [employer] pre-filled syringes via Cover My Meds. Will update once we receive a response.   [id number]

## 2020-11-04 NOTE — Telephone Encounter (Signed)
Submitted a Prior Authorization request to [employer] for [employer] via Cover My Meds. Will update once we receive a response.   Key: [id number]

## 2020-11-04 NOTE — Telephone Encounter (Signed)
Received notification from [place] (Medicaid) regarding a prior authorization for [employer]. Authorization has been APPROVED from 11/04/20 to 11/04/21.   Authorization # [id number]  Knox Saliva, PharmD, MPH Clinical Pharmacist (Rheumatology and Pulmonology)

## 2020-11-05 ENCOUNTER — Other Ambulatory Visit: Payer: Self-pay

## 2020-11-05 ENCOUNTER — Inpatient Hospital Stay (HOSPITAL_COMMUNITY)
Admission: AD | Admit: 2020-11-05 | Discharge: 2020-11-08 | DRG: 807 | Disposition: A | Discharge status: Home / Self Care | Payer: BC Managed Care – PPO | Attending: Obstetrics and Gynecology | Admitting: Obstetrics and Gynecology

## 2020-11-05 ENCOUNTER — Encounter (HOSPITAL_COMMUNITY): Payer: Self-pay | Admitting: Obstetrics and Gynecology

## 2020-11-05 ENCOUNTER — Other Ambulatory Visit (HOSPITAL_COMMUNITY): Payer: No Typology Code available for payment source | Attending: Obstetrics and Gynecology

## 2020-11-05 DIAGNOSIS — Z3A39 39 weeks gestation of pregnancy: Secondary | ICD-10-CM | POA: Diagnosis not present

## 2020-11-05 DIAGNOSIS — R079 Chest pain, unspecified: Secondary | ICD-10-CM | POA: Diagnosis not present

## 2020-11-05 DIAGNOSIS — O134 Gestational [pregnancy-induced] hypertension without significant proteinuria, complicating childbirth: Principal | ICD-10-CM | POA: Diagnosis present

## 2020-11-05 DIAGNOSIS — O99893 Other specified diseases and conditions complicating puerperium: Secondary | ICD-10-CM | POA: Diagnosis not present

## 2020-11-05 DIAGNOSIS — Z20822 Contact with and (suspected) exposure to covid-19: Secondary | ICD-10-CM | POA: Diagnosis present

## 2020-11-05 DIAGNOSIS — Z3490 Encounter for supervision of normal pregnancy, unspecified, unspecified trimester: Secondary | ICD-10-CM | POA: Diagnosis present

## 2020-11-05 DIAGNOSIS — O99824 Streptococcus B carrier state complicating childbirth: Secondary | ICD-10-CM | POA: Diagnosis present

## 2020-11-05 DIAGNOSIS — O43813 Placental infarction, third trimester: Secondary | ICD-10-CM | POA: Diagnosis not present

## 2020-11-05 DIAGNOSIS — O411230 Chorioamnionitis, third trimester, not applicable or unspecified: Secondary | ICD-10-CM | POA: Diagnosis not present

## 2020-11-05 LAB — CBC
HCT: 36 % (ref 36.0–46.0)
HCT: 32.9 % — ABNORMAL LOW (ref 36.0–46.0)
Hemoglobin: 12.6 g/dL (ref 12.0–15.0)
Hemoglobin: 11 g/dL — ABNORMAL LOW (ref 12.0–15.0)
MCH: 31.2 pg (ref 26.0–34.0)
MCH: 29.9 pg (ref 26.0–34.0)
MCHC: 35 g/dL (ref 30.0–36.0)
MCHC: 33.4 g/dL (ref 30.0–36.0)
MCV: 89.1 fL (ref 80.0–100.0)
MCV: 89.4 fL (ref 80.0–100.0)
Platelets: 237 10*3/uL (ref 150–400)
Platelets: 204 10*3/uL (ref 150–400)
RBC: 4.04 MIL/uL (ref 3.87–5.11)
RBC: 3.68 MIL/uL — ABNORMAL LOW (ref 3.87–5.11)
RDW: 13.2 % (ref 11.5–15.5)
RDW: 13 % (ref 11.5–15.5)
WBC: 5.1 10*3/uL (ref 4.0–10.5)
WBC: 6 10*3/uL (ref 4.0–10.5)
nRBC: 0 % (ref 0.0–0.2)
nRBC: 0 % (ref 0.0–0.2)

## 2020-11-05 LAB — COMPREHENSIVE METABOLIC PANEL
ALT: 15 U/L (ref 0–44)
AST: 22 U/L (ref 15–41)
Albumin: 3.2 g/dL — ABNORMAL LOW (ref 3.5–5.0)
Alkaline Phosphatase: 128 U/L — ABNORMAL HIGH (ref 38–126)
Anion gap: 10 (ref 5–15)
BUN: 5 mg/dL — ABNORMAL LOW (ref 6–20)
CO2: 20 mmol/L — ABNORMAL LOW (ref 22–32)
Calcium: 9.1 mg/dL (ref 8.9–10.3)
Chloride: 105 mmol/L (ref 98–111)
Creatinine, Ser: 0.76 mg/dL (ref 0.44–1.00)
GFR, Estimated: >60 mL/min (ref >60)
Glucose, Bld: 98 mg/dL (ref 70–99)
Potassium: 3.7 mmol/L (ref 3.5–5.1)
Sodium: 135 mmol/L (ref 135–145)
Total Bilirubin: 0.4 mg/dL (ref 0.3–1.2)
Total Protein: 6.9 g/dL (ref 6.5–8.1)

## 2020-11-05 LAB — RESP PANEL BY RT-PCR (FLU A&B, COVID) ARPGX2
Influenza A by PCR: NEGATIVE
Influenza B by PCR: NEGATIVE
SARS Coronavirus 2 by RT PCR: NEGATIVE

## 2020-11-05 LAB — TYPE AND SCREEN
ABO/RH(D): O POS
Antibody Screen: NEGATIVE

## 2020-11-05 LAB — LACTATE DEHYDROGENASE: LDH: 188 U/L (ref 98–192)

## 2020-11-05 LAB — PROTEIN / CREATININE RATIO, URINE
Creatinine, Urine: 53.38 mg/dL
Total Protein, Urine: <6 mg/dL

## 2020-11-05 MED ORDER — LACTATED RINGERS IV SOLN: 500.0000 mL | Freq: Once | INTRAVENOUS | Status: DC

## 2020-11-05 MED ORDER — DIPHENHYDRAMINE HCL 50 MG/ML IJ SOLN: 12.5000 mg | INTRAMUSCULAR | Status: DC | PRN

## 2020-11-05 MED ORDER — OXYTOCIN-SODIUM CHLORIDE 30-0.9 UT/500ML-% IV SOLN: 2.5000 [IU]/h | INTRAVENOUS | Status: DC

## 2020-11-05 MED ORDER — OXYTOCIN-SODIUM CHLORIDE 30-0.9 UT/500ML-% IV SOLN
1.0000 m[IU]/min | INTRAVENOUS | Status: DC
  Administered 2020-11-06: 2 m[IU]/min via INTRAVENOUS
  Filled 2020-11-05: qty 500

## 2020-11-05 MED ORDER — MISOPROSTOL 25 MCG QUARTER TABLET
25.0000 ug | ORAL_TABLET | ORAL | Status: DC | PRN
  Administered 2020-11-05: 25 ug via VAGINAL
  Filled 2020-11-05 (×2): qty 1

## 2020-11-05 MED ORDER — LIDOCAINE HCL (PF) 1 % IJ SOLN: 30.0000 mL | INTRAMUSCULAR | Status: DC | PRN

## 2020-11-05 MED ORDER — ACETAMINOPHEN 325 MG PO TABS: 650.0000 mg | ORAL_TABLET | ORAL | Status: DC | PRN

## 2020-11-05 MED ORDER — EPHEDRINE 5 MG/ML INJ: 10.0000 mg | INTRAVENOUS | Status: DC | PRN

## 2020-11-05 MED ORDER — ONDANSETRON HCL 4 MG/2ML IJ SOLN
4.0000 mg | Freq: Four times a day (QID) | INTRAMUSCULAR | Status: DC | PRN
  Administered 2020-11-06: 4 mg via INTRAVENOUS
  Filled 2020-11-05: qty 2

## 2020-11-05 MED ORDER — OXYCODONE-ACETAMINOPHEN 5-325 MG PO TABS
1.0000 | ORAL_TABLET | ORAL | Status: DC | PRN
Start: 2020-11-05 — End: 2020-11-06

## 2020-11-05 MED ORDER — OXYCODONE-ACETAMINOPHEN 5-325 MG PO TABS
2.0000 | ORAL_TABLET | ORAL | Status: DC | PRN
Start: 2020-11-05 — End: 2020-11-06

## 2020-11-05 MED ORDER — LACTATED RINGERS IV SOLN: 500.0000 mL | INTRAVENOUS | Status: DC | PRN

## 2020-11-05 MED ORDER — PENICILLIN G POT IN DEXTROSE 60000 UNIT/ML IV SOLN
3.0000 10*6.[IU] | INTRAVENOUS | Status: DC
  Administered 2020-11-05 – 2020-11-06 (×7): 3 10*6.[IU] via INTRAVENOUS
  Filled 2020-11-05 (×7): qty 50

## 2020-11-05 MED ORDER — FENTANYL CITRATE (PF) 100 MCG/2ML IJ SOLN
50.0000 ug | INTRAMUSCULAR | Status: DC | PRN
  Administered 2020-11-05 – 2020-11-06 (×2): 100 ug via INTRAVENOUS
  Filled 2020-11-05 (×2): qty 2

## 2020-11-05 MED ORDER — LACTATED RINGERS IV SOLN: INTRAVENOUS | Status: DC

## 2020-11-05 MED ORDER — SODIUM CHLORIDE 0.9 % IV SOLN
5.0000 10*6.[IU] | Freq: Once | INTRAVENOUS | Status: AC
  Administered 2020-11-05: 5 10*6.[IU] via INTRAVENOUS
  Filled 2020-11-05: qty 5

## 2020-11-05 MED ORDER — FENTANYL-BUPIVACAINE-NACL 0.5-0.125-0.9 MG/250ML-% EP SOLN
12.0000 mL/h | EPIDURAL | Status: DC | PRN
  Administered 2020-11-06: 12 mL/h via EPIDURAL
  Filled 2020-11-05: qty 250

## 2020-11-05 MED ORDER — TERBUTALINE SULFATE 1 MG/ML IJ SOLN: 0.2500 mg | Freq: Once | INTRAMUSCULAR | Status: DC | PRN

## 2020-11-05 MED ORDER — OXYTOCIN BOLUS FROM INFUSION
333.0000 mL | Freq: Once | INTRAVENOUS | Status: AC
  Administered 2020-11-06: 333 mL via INTRAVENOUS

## 2020-11-05 MED ORDER — MISOPROSTOL 25 MCG QUARTER TABLET
25.0000 ug | ORAL_TABLET | ORAL | Status: DC
  Administered 2020-11-05: 25 ug via VAGINAL

## 2020-11-05 MED ORDER — PHENYLEPHRINE 40 MCG/ML (10ML) SYRINGE FOR IV PUSH (FOR BLOOD PRESSURE SUPPORT): 80.0000 ug | PREFILLED_SYRINGE | INTRAVENOUS | Status: DC | PRN

## 2020-11-05 MED ORDER — MISOPROSTOL 50MCG HALF TABLET
50.0000 ug | ORAL_TABLET | Freq: Once | ORAL | Status: AC
  Administered 2020-11-05: 50 ug via BUCCAL
  Filled 2020-11-05: qty 1

## 2020-11-05 MED ORDER — SOD CITRATE-CITRIC ACID 500-334 MG/5ML PO SOLN: 30.0000 mL | ORAL | Status: DC | PRN

## 2020-11-05 NOTE — Progress Notes (Signed)
OB Progress Note  S: Patient bouncing on birthing ball.  She is not feeling any discomfort or contractions. Patient consents to placement of foley bulb.  O: BP (!) 133/93   Pulse 84   Temp 98 F (36.7 C) (Oral)   Resp 16   Ht 5\' 5"  (1.651 m)   Wt 75.3 kg   BMI 27.62 kg/m   FHT: 140bpm, moderate variablity, + accels, - decels Toco: irregular, irribatility SVE: 1/50/high, posterior, firm  A/P: 35 y.o. G1P0000  @ [redacted]w[redacted]d admitted for IOL for gestational HTN.  FWB: Cat. I Labor course: S/p Cytotec x 2, FB placed - may have light meal Pain: Per patient request GBS: Positive - PCN infusing Anticipate SVD  Drema Dallas, DO [phone] (office)

## 2020-11-05 NOTE — Anesthesia Preprocedure Evaluation (Addendum)
Anesthesia Evaluation    Reviewed: Allergy & Precautions, Patient's Chart, lab work & pertinent test results  Airway Mallampati: II  TM Distance: >3 FB Neck ROM: Full    Dental no notable dental hx.    Pulmonary former smoker,    Pulmonary exam normal breath sounds clear to auscultation       Cardiovascular hypertension, Normal cardiovascular exam Rhythm:Regular Rate:Normal  g Htn   Neuro/Psych    GI/Hepatic Neg liver ROS,   Endo/Other  negative endocrine ROS  Renal/GU negative Renal ROS     Musculoskeletal   Abdominal   Peds  Hematology   Anesthesia Other Findings   Reproductive/Obstetrics (+) Pregnancy                            Anesthesia Physical Anesthesia Plan  ASA: II  Anesthesia Plan: Epidural   Post-op Pain Management:    Induction:   PONV Risk Score and Plan:   Airway Management Planned:   Additional Equipment:   Intra-op Plan:   Post-operative Plan:   Informed Consent:   Plan Discussed with:   Anesthesia Plan Comments: (39.3 wk primagravida w gHTN for LEA)       Anesthesia Quick Evaluation

## 2020-11-05 NOTE — Telephone Encounter (Signed)
Received additional clinical questions form [employer]. Completed and faxed to plan. Awaiting response.

## 2020-11-05 NOTE — H&P (Addendum)
HPI: 35 y.o. G1P0000 @ [redacted]w[redacted]d estimated gestational age (as dated by IVF transfer) presents for induction of labor for gestational HTN.  Leakage of fluid:  No Vaginal bleeding:  No Contractions:  No Fetal movement:  Yes  Prenatal care has been provided by Dr. Drema Dallas [facility])  ROS:  Denies fevers, chills, chest pain, visual changes, SOB, RUQ/epigastric pain, N/V, dysuria, hematuria, or sudden onset/worsening bilateral LE or facial edema.  Pregnancy complicated by: Gestational HTN (ruled in on 2/8) IVF pregnancy AMA Rheumatoid arthritis SMA carrier (FOB negative) GBS Positive (urine) Vitamin D deficiency History of left dermoid cyst s/p LSO (2012) History of right salpingo-oophorolysis and R fimbrioplasty (2019)   Prenatal Transfer Tool  Maternal Diabetes: No Genetic Screening: Declined Maternal Ultrasounds/Referrals: Normal Fetal Ultrasounds or other Referrals:  None Maternal Substance Abuse:  No Significant Maternal Medications:  None Significant Maternal Lab Results: Group B Strep positive   Prenatal Labs Blood type:  O Positive Antibody screen:  Negative CBC:  H/H 12/35 Rubella: Immune RPR:  Non-reactive Hep B:  Negative Hep C:  Negative GC/CT:  Negative Glucola:  105 (wnl)  Immunizations: Tdap: Given prenatally Flu: Declines  OBHx:  OB History    Gravida  1   Para  0   Term  0   Preterm  0   AB  0   Living  0     SAB  0   IAB  0   Ectopic  0   Multiple  0   Live Births  0          PMHx:  Rheumatoid arthritis, Vitamin D deficiency, History of infertility Meds:  PNV  Allergy:  No Known Allergies SurgHx: LSO, R salpingo-oophorolysis an dr fimbrioplasty SocHx:   Denies Tobacco, ETOH, illicit drugs  O: BP (!) 144/95   Pulse 100  Gen. AAOx3, NAD CV.  RRR  Resp. CTAB, no wheezes/rales/rhonchi Abd. Gravid, soft, non-tender throughout, no rebound/guarding Extr.  no bilateral LE edema, no calf tenderness bilaterally SVE:  1/50/high, posterior, firm   Last [place] (11/05/20):  BPP 8/8, cephalic Last growth [place] (7/51):  EFW 2955g (70%), AAFV, cephalic, posterior placenta  Labs: see orders  A/P:  35 y.o. G1P0000 @ [redacted]w[redacted]d who presents for induction of labor for gestational HTN.  IOL - gestational HTN - Admit to L&D - Admit labs (CBC, T&S, COVID screen) - CEFM/Toco - Diet:  Clear liquids - IVF:  LR at 125cc/hour - VTE Prophylaxis:  SCDs - GBS Status:  Positive (noted on urine during early pregnancy) - PCN ordered - Presentation:  Cephalic by [place] today - Pain control:  Per patient request - Induction method:  Cytotec  AMA - Declined genetic screening this pregnancy - Normal FAS - ASA 81mg  daily for preeclampsia prophylaxis  IVF pregnancy - Last growth [place] EFW 2955g (01%), AAFV, cephalic (7/49)  Rheumatoid arthritis - Medications:  None now, previously on Cimzia injections 200mg  q14 days - Rheumatologist:  Dr. Estanislado Pandy ([facility])  SMA Carrier - FOB tested negative, pe rpatient  GBS positive - Noted on initial urine culture - PCN ordered  History of left dermoid cyst - S/p LSO 2012 (diagnostic laparotomy converted to open laparotomy)  History of R salpingo-oophorolysis and R fimbrioplasty - Performed laparoscopically in 2019  Drema Dallas, [place] (office)

## 2020-11-06 ENCOUNTER — Encounter (HOSPITAL_COMMUNITY): Payer: Self-pay | Admitting: Obstetrics and Gynecology

## 2020-11-06 ENCOUNTER — Ambulatory Visit: Payer: BC Managed Care – PPO | Admitting: Pharmacist

## 2020-11-06 ENCOUNTER — Inpatient Hospital Stay (HOSPITAL_COMMUNITY): Payer: BC Managed Care – PPO | Admitting: Anesthesiology

## 2020-11-06 LAB — COMPREHENSIVE METABOLIC PANEL
ALT: 13 U/L (ref 0–44)
AST: 25 U/L (ref 15–41)
Albumin: 2.7 g/dL — ABNORMAL LOW (ref 3.5–5.0)
Alkaline Phosphatase: 101 U/L (ref 38–126)
Anion gap: 10 (ref 5–15)
BUN: <5 mg/dL — ABNORMAL LOW (ref 6–20)
CO2: 20 mmol/L — ABNORMAL LOW (ref 22–32)
Calcium: 8.7 mg/dL — ABNORMAL LOW (ref 8.9–10.3)
Chloride: 103 mmol/L (ref 98–111)
Creatinine, Ser: 0.8 mg/dL (ref 0.44–1.00)
GFR, Estimated: >60 mL/min (ref >60)
Glucose, Bld: 94 mg/dL (ref 70–99)
Potassium: 3.8 mmol/L (ref 3.5–5.1)
Sodium: 133 mmol/L — ABNORMAL LOW (ref 135–145)
Total Bilirubin: 0.7 mg/dL (ref 0.3–1.2)
Total Protein: 6 g/dL — ABNORMAL LOW (ref 6.5–8.1)

## 2020-11-06 LAB — CBC
HCT: 31.6 % — ABNORMAL LOW (ref 36.0–46.0)
HCT: 32.2 % — ABNORMAL LOW (ref 36.0–46.0)
Hemoglobin: 11 g/dL — ABNORMAL LOW (ref 12.0–15.0)
Hemoglobin: 10.6 g/dL — ABNORMAL LOW (ref 12.0–15.0)
MCH: 30.9 pg (ref 26.0–34.0)
MCH: 29.7 pg (ref 26.0–34.0)
MCHC: 34.8 g/dL (ref 30.0–36.0)
MCHC: 32.9 g/dL (ref 30.0–36.0)
MCV: 88.8 fL (ref 80.0–100.0)
MCV: 90.2 fL (ref 80.0–100.0)
Platelets: 218 10*3/uL (ref 150–400)
Platelets: 185 10*3/uL (ref 150–400)
RBC: 3.56 MIL/uL — ABNORMAL LOW (ref 3.87–5.11)
RBC: 3.57 MIL/uL — ABNORMAL LOW (ref 3.87–5.11)
RDW: 13 % (ref 11.5–15.5)
RDW: 12.9 % (ref 11.5–15.5)
WBC: 7.3 10*3/uL (ref 4.0–10.5)
WBC: 18.4 10*3/uL — ABNORMAL HIGH (ref 4.0–10.5)
nRBC: 0 % (ref 0.0–0.2)
nRBC: 0 % (ref 0.0–0.2)

## 2020-11-06 LAB — RPR: RPR Ser Ql: NONREACTIVE

## 2020-11-06 MED ORDER — ONDANSETRON HCL 4 MG/2ML IJ SOLN: 4.0000 mg | INTRAMUSCULAR | Status: DC | PRN

## 2020-11-06 MED ORDER — LABETALOL HCL 5 MG/ML IV SOLN: 40.0000 mg | INTRAVENOUS | Status: DC | PRN

## 2020-11-06 MED ORDER — MISOPROSTOL 200 MCG PO TABS
ORAL_TABLET | ORAL | Status: AC
  Filled 2020-11-06: qty 5

## 2020-11-06 MED ORDER — WITCH HAZEL-GLYCERIN EX PADS: 1.0000 "application " | MEDICATED_PAD | CUTANEOUS | Status: DC | PRN

## 2020-11-06 MED ORDER — NIFEDIPINE ER OSMOTIC RELEASE 30 MG PO TB24
ORAL_TABLET | ORAL | Status: AC
  Filled 2020-11-06: qty 1

## 2020-11-06 MED ORDER — LIDOCAINE HCL (PF) 1 % IJ SOLN
INTRAMUSCULAR | Status: DC | PRN
  Administered 2020-11-06: 11 mL via EPIDURAL

## 2020-11-06 MED ORDER — AMMONIA AROMATIC IN INHA
RESPIRATORY_TRACT | Status: AC
  Filled 2020-11-06: qty 10

## 2020-11-06 MED ORDER — SIMETHICONE 80 MG PO CHEW: 80.0000 mg | CHEWABLE_TABLET | ORAL | Status: DC | PRN

## 2020-11-06 MED ORDER — CARBOPROST TROMETHAMINE 250 MCG/ML IM SOLN
250.0000 ug | Freq: Once | INTRAMUSCULAR | Status: DC
  Administered 2020-11-06: 250 ug via INTRAMUSCULAR

## 2020-11-06 MED ORDER — SOD CITRATE-CITRIC ACID 500-334 MG/5ML PO SOLN
30.0000 mL | Freq: Once | ORAL | Status: AC
  Administered 2020-11-07: 30 mL via ORAL
  Filled 2020-11-06: qty 15

## 2020-11-06 MED ORDER — HYDRALAZINE HCL 20 MG/ML IJ SOLN: 10.0000 mg | INTRAMUSCULAR | Status: DC | PRN

## 2020-11-06 MED ORDER — IBUPROFEN 800 MG PO TABS
800.0000 mg | ORAL_TABLET | Freq: Three times a day (TID) | ORAL | Status: DC
  Administered 2020-11-06 – 2020-11-08 (×6): 800 mg via ORAL
  Filled 2020-11-06 (×6): qty 1

## 2020-11-06 MED ORDER — ZOLPIDEM TARTRATE 5 MG PO TABS: 5.0000 mg | ORAL_TABLET | Freq: Every evening | ORAL | Status: DC | PRN

## 2020-11-06 MED ORDER — LABETALOL HCL 5 MG/ML IV SOLN
80.0000 mg | INTRAVENOUS | Status: DC | PRN
Start: 2020-11-06 — End: 2020-11-08

## 2020-11-06 MED ORDER — METHYLERGONOVINE MALEATE 0.2 MG/ML IJ SOLN: 0.2000 mg | Freq: Once | INTRAMUSCULAR | Status: DC

## 2020-11-06 MED ORDER — BENZOCAINE-MENTHOL 20-0.5 % EX AERO: 1.0000 "application " | INHALATION_SPRAY | CUTANEOUS | Status: DC | PRN

## 2020-11-06 MED ORDER — ONDANSETRON HCL 4 MG PO TABS: 4.0000 mg | ORAL_TABLET | ORAL | Status: DC | PRN

## 2020-11-06 MED ORDER — PRENATAL MULTIVITAMIN CH
1.0000 | ORAL_TABLET | Freq: Every day | ORAL | Status: DC
  Filled 2020-11-06 (×2): qty 1

## 2020-11-06 MED ORDER — TETANUS-DIPHTH-ACELL PERTUSSIS 5-2.5-18.5 LF-MCG/0.5 IM SUSY: 0.5000 mL | PREFILLED_SYRINGE | Freq: Once | INTRAMUSCULAR | Status: DC

## 2020-11-06 MED ORDER — TRANEXAMIC ACID-NACL 1000-0.7 MG/100ML-% IV SOLN
INTRAVENOUS | Status: AC
  Administered 2020-11-06: 1000 mg
  Filled 2020-11-06: qty 100

## 2020-11-06 MED ORDER — MISOPROSTOL 200 MCG PO TABS
1000.0000 ug | ORAL_TABLET | Freq: Once | ORAL | Status: AC
  Administered 2020-11-06: 1000 ug via RECTAL

## 2020-11-06 MED ORDER — CARBOPROST TROMETHAMINE 250 MCG/ML IM SOLN
INTRAMUSCULAR | Status: AC
  Administered 2020-11-06: 250 ug
  Filled 2020-11-06: qty 1

## 2020-11-06 MED ORDER — SENNOSIDES-DOCUSATE SODIUM 8.6-50 MG PO TABS
2.0000 | ORAL_TABLET | Freq: Every day | ORAL | Status: DC
  Administered 2020-11-07 – 2020-11-08 (×2): 2 via ORAL
  Filled 2020-11-06 (×2): qty 2

## 2020-11-06 MED ORDER — COCONUT OIL OIL: 1.0000 "application " | TOPICAL_OIL | Status: DC | PRN

## 2020-11-06 MED ORDER — DIPHENHYDRAMINE HCL 25 MG PO CAPS: 25.0000 mg | ORAL_CAPSULE | Freq: Four times a day (QID) | ORAL | Status: DC | PRN

## 2020-11-06 MED ORDER — LABETALOL HCL 5 MG/ML IV SOLN
20.0000 mg | INTRAVENOUS | Status: DC | PRN
  Administered 2020-11-06: 20 mg via INTRAVENOUS
  Filled 2020-11-06: qty 4

## 2020-11-06 MED ORDER — NIFEDIPINE ER OSMOTIC RELEASE 30 MG PO TB24
30.0000 mg | ORAL_TABLET | Freq: Every day | ORAL | Status: DC
  Administered 2020-11-06 – 2020-11-07 (×2): 30 mg via ORAL
  Filled 2020-11-06: qty 1

## 2020-11-06 MED ORDER — DIBUCAINE (PERIANAL) 1 % EX OINT: 1.0000 "application " | TOPICAL_OINTMENT | CUTANEOUS | Status: DC | PRN

## 2020-11-06 MED ORDER — DIPHENOXYLATE-ATROPINE 2.5-0.025 MG PO TABS
2.0000 | ORAL_TABLET | Freq: Once | ORAL | Status: AC
  Administered 2020-11-06: 2 via ORAL
  Filled 2020-11-06: qty 2

## 2020-11-06 MED ORDER — LABETALOL HCL 5 MG/ML IV SOLN
20.0000 mg | Freq: Once | INTRAVENOUS | Status: AC
  Administered 2020-11-06: 20 mg via INTRAVENOUS
  Filled 2020-11-06: qty 4

## 2020-11-06 MED ORDER — METHYLERGONOVINE MALEATE 0.2 MG/ML IJ SOLN
INTRAMUSCULAR | Status: AC
  Administered 2020-11-06: 0.2 mg
  Filled 2020-11-06: qty 1

## 2020-11-06 MED ORDER — ACETAMINOPHEN 325 MG PO TABS
650.0000 mg | ORAL_TABLET | ORAL | Status: DC | PRN
  Administered 2020-11-06: 650 mg via ORAL
  Filled 2020-11-06: qty 2

## 2020-11-06 MED ORDER — TRANEXAMIC ACID-NACL 1000-0.7 MG/100ML-% IV SOLN: 1000.0000 mg | Freq: Once | INTRAVENOUS | Status: DC

## 2020-11-06 NOTE — Progress Notes (Signed)
Subjective:    Foley bulb out. SVE per RN 4/50/-2. Will start pitocin 2x2. CAT 1  Objective:    VS: BP (!) 142/84   Pulse 78   Temp 99 F (37.2 C) (Oral)   Resp 18   Ht 5\' 5"  (1.651 m)   Wt 75.3 kg   BMI 27.62 kg/m  FHR : baseline 140 / variability moderate / accelerations present / absent decelerations Toco: contractions every 1-6 minutes  Membranes: intact Dilation: 4.5 Effacement (%): 50 Cervical Position: Posterior Station: -3 Presentation: Vertex Exam by:: Data processing manager RN  Assessment/Plan:   35 y.o. G1P0000 [redacted]w[redacted]d IOL for GHTN.   Labor: Progressing normally, cytoctec x 3, foley bulb, starting pitocin - titrate 2x2 Preeclampsia:  no signs or symptoms of toxicity Fetal Wellbeing:  Category I Pain Control:  Epidural prn I/D:  GBS positive Anticipated MOD:  NSVD  Domingo Pulse MSN, CNM 11/06/2020 1:51 AM

## 2020-11-06 NOTE — Lactation Note (Signed)
This note was copied from a baby's chart. Lactation Consultation Note  Patient Name: Meghan Arnold PTELM'R Date: 11/06/2020 Age:36 hours  Attempted LC visit after delivery. Mother is resting at the moment. Infant is with father.  LC will see mother once she is transferred to [facility].     Layla Kesling A Higuera Ancidey 11/06/2020, 7:08 PM

## 2020-11-06 NOTE — Progress Notes (Signed)
S: Pt c/o chest pain. CNM to bedside. Pt reports chest tightness for 5-10 min after meal. Hx of reflux during pregnancy. Denies chest pain and SOB at time of exam. Pt agrees to EKG and antacid.  O:   VS:  Vitals:   11/06/20 2100 11/06/20 2200 11/06/20 2220 11/06/20 2238  BP: 134/80 (!) 145/78 (!) 152/80 (!) 145/79 Comment: prior to med admin  Pulse: 80 86    Temp: (!) 101.9 F (38.8 C) 100.1 F (37.8 C)    Resp: 16 16    Height:      Weight:      SpO2: 100% 99%    TempSrc: Oral Oral    BMI (Calculated):         LABS:  Recent Labs    11/06/20 0316 11/06/20 1937  WBC 7.3 18.4*  HGB 11.0* 10.6*  PLT 218 185   CMP     Component Value Date/Time   NA 133 (L) 11/06/2020 1935   K 3.8 11/06/2020 1935   CL 103 11/06/2020 1935   CO2 20 (L) 11/06/2020 1935   GLUCOSE 94 11/06/2020 1935   BUN <5 (L) 11/06/2020 1935   CREATININE 0.80 11/06/2020 1935   CREATININE 0.68 11/01/2020 1016   CALCIUM 8.7 (L) 11/06/2020 1935   PROT 6.0 (L) 11/06/2020 1935   ALBUMIN 2.7 (L) 11/06/2020 1935   AST 25 11/06/2020 1935   ALT 13 11/06/2020 1935   ALKPHOS 101 11/06/2020 1935   BILITOT 0.7 11/06/2020 1935   GFRNONAA >60 11/06/2020 1935   GFRNONAA 113 11/01/2020 1016   GFRAA 131 11/01/2020 1016    Blood type: --/--/O POS (02/08 1138)                     I&O: Intake/Output      02/09 0701 02/10 0700   Urine (mL/kg/hr) 1050 (0.8)   Other 331   Blood 947   Total Output 2328   Net -2328        Focused Exam: Gen: Alert and oriented X3 Lungs: Clear and unlabored Heart: regular rate and rhythm / no mumurs Extremities: trace, non-pitting edema, no calf pain or tenderness    A:  PPD # 0     -S/P SVD w/ PPH EBL 947 R/O pre-eclampsia    - elevated BP last 2 wks of preg and after delivery Chest pain vs. acid reflux  P:  Started procardia 30 XL PO daily Labetalol protocol for severe range BP Protein/creatinine ratio pending CMP normal EKG pending Bicitra 43ml PO no  Plan  developed w/ Dr. Mancel Bale.   Arrie Eastern, MSN, CNM 11/06/2020, 11:37 PM

## 2020-11-06 NOTE — Anesthesia Procedure Notes (Signed)
Epidural Patient location during procedure: OB Start time: 11/06/2020 9:07 AM End time: 11/06/2020 9:19 AM  Staffing Anesthesiologist: Lynda Rainwater, MD Performed: other anesthesia staff   Preanesthetic Checklist Completed: patient identified, IV checked, site marked, risks and benefits discussed, surgical consent, monitors and equipment checked, pre-op evaluation and timeout performed  Epidural Patient position: sitting Prep: ChloraPrep Patient monitoring: heart rate, cardiac monitor, continuous pulse ox and blood pressure Approach: midline Location: L2-L3 Injection technique: LOR saline  Needle:  Needle type: Tuohy  Needle gauge: 17 G Needle length: 9 cm Needle insertion depth: 5 cm Catheter type: closed end flexible Catheter size: 20 Guage Catheter at skin depth: 9 cm Test dose: negative  Assessment Events: blood not aspirated, injection not painful, no injection resistance, no paresthesia and negative IV test  Additional Notes Epidural placed by SRNA under direct supervisionReason for block:procedure for pain

## 2020-11-06 NOTE — Lactation Note (Signed)
This note was copied from a baby's chart. Lactation Consultation Note  Patient Name: Meghan Arnold Date: 11/06/2020   Age:36 hours P1, term female infant. LC entered room, mom was eating and Per RN ,mom is  not feeling well and  has a temp, mom  will rest and call [facility] services when she is ready to latch infant at the breast.  Maternal Data    Feeding    LATCH Score                    Lactation Tools Discussed/Used    Interventions    Discharge    Consult Status      [name] 11/06/2020, 9:26 PM

## 2020-11-06 NOTE — Progress Notes (Signed)
OB Progress Note  S: Patient resting comfortably.  Mild discomfort. Patient consents to AROM and IUPC placement.  O: BP 132/82   Pulse 69   Temp 98.4 F (36.9 C) (Oral)   Resp 16   Ht 5\' 5"  (1.651 m)   Wt 75.3 kg   BMI 27.62 kg/m   FHT: 140bpm, moderate variablity, + accels, - decels Toco: irregular, irritability, not graphing well SVE: 5/50/-3, posterior, sutures palpated AROM:  Clear, non-odorous, IUPC placed  A/P: 35 y.o. G1P0000  @ [redacted]w[redacted]d admitted for IOL for gestational HTN.  FWB: Cat. I Labor course: S/p Cytotec x 3, s/p FB, AROM and IUPC placed now Pain: Per patient request GBS: Positive - PCN infusing Anticipate SVD  Drema Dallas, DO [phone] (office)

## 2020-11-06 NOTE — Lactation Note (Signed)
This note was copied from a baby's chart. Lactation Consultation Note  Patient Name: Boy Nalany Steedley [id number] Date: 11/06/2020 Reason for consult: 1st time breastfeeding;Term Age:36 hours P1, term female infant. Infant did not latch in L&D, mom with PPH and fever. Mom latched infant on her right breast using the football hold position, infant latched with depth, was still breastfeeding when [facility] left the room. Mom was taught hand expression but colostrum not present at this time. LC discussed using DEBP, mom open to pumping after latching infant at the breast to help with breast stimulation and establishing milk supply. Mom knows to call RN or LC if she needs further assistance with latching infant at the breast. Mom will breastfeed infant according to cues, 8 to 12+ times within 24 hours or more STS. LC discussed infant's input and output. LC discussed continue latching infant at breast, doing lots of STS, LC briefly mention donor breast milk will reassess after 24 hours if needed.  Mom shown how to use DEBP & how to disassemble, clean, & reassemble parts. Mom made aware of O/P services, breastfeeding support groups, community resources, and our phone # for post-discharge questions.   Maternal Data Has patient been taught Hand Expression?: Yes Does the patient have breastfeeding experience prior to this delivery?: No  Feeding Mother's Current Feeding Choice: Breast Milk  LATCH Score Latch: Grasps breast easily, tongue down, lips flanged, rhythmical sucking.  Audible Swallowing: A few with stimulation  Type of Nipple: Everted at rest and after stimulation  Comfort (Breast/Nipple): Soft / non-tender  Hold (Positioning): Assistance needed to correctly position infant at breast and maintain latch.  LATCH Score: 8   Lactation Tools Discussed/Used    Interventions Interventions: Breast feeding basics reviewed;Assisted with latch;Skin to skin;Breast massage;Hand express;Breast  compression;Adjust position;Support pillows;Position options;DEBP  Discharge Pump: Personal;DEBP WIC Program: No  Consult Status Consult Status: Follow-up Date: 11/07/20 Follow-up type: In-patient    [name] 11/06/2020, 11:38 PM

## 2020-11-06 NOTE — Progress Notes (Signed)
OB Progress Note  S: Patient resting comfortably with epidural. Feels some pressure.  O: BP (!) 125/91   Pulse 85   Temp 98.6 F (37 C) (Oral)   Resp 16   Ht 5\' 5"  (1.651 m)   Wt 75.3 kg   SpO2 98%   BMI 27.62 kg/m   FHT: 140bpm, moderate variablity, + accels, - decels Toco: q1.5 minutes SVE: 9/100/-1 to 0 station by primary RN at 1530  A/P: 35 y.o. G1P0000 @ [redacted]w[redacted]d admitted for IOL for gestational HTN.  FWB: Cat. I Labor course: S/p Cytotec x 3, s/p FB, Pitocin at 37mU/min Pain: Per patient request GBS: Positive - PCN infusing Will check next ~25 minutes Anticipate SVD  Drema Dallas, [place] (office)

## 2020-11-07 ENCOUNTER — Inpatient Hospital Stay (HOSPITAL_COMMUNITY): Payer: BC Managed Care – PPO

## 2020-11-07 ENCOUNTER — Inpatient Hospital Stay (HOSPITAL_COMMUNITY)
Admission: AD | Admit: 2020-11-07 | Payer: BC Managed Care – PPO | Source: Home / Self Care | Admitting: Obstetrics and Gynecology

## 2020-11-07 LAB — CBC
HCT: 26.3 % — ABNORMAL LOW (ref 36.0–46.0)
Hemoglobin: 8.8 g/dL — ABNORMAL LOW (ref 12.0–15.0)
MCH: 29.9 pg (ref 26.0–34.0)
MCHC: 33.5 g/dL (ref 30.0–36.0)
MCV: 89.5 fL (ref 80.0–100.0)
Platelets: 171 10*3/uL (ref 150–400)
RBC: 2.94 MIL/uL — ABNORMAL LOW (ref 3.87–5.11)
RDW: 13.1 % (ref 11.5–15.5)
WBC: 18 10*3/uL — ABNORMAL HIGH (ref 4.0–10.5)
nRBC: 0 % (ref 0.0–0.2)

## 2020-11-07 LAB — PROTEIN / CREATININE RATIO, URINE
Creatinine, Urine: 49.61 mg/dL
Protein Creatinine Ratio: 0.4 mg/mg{Cre} — ABNORMAL HIGH (ref 0.00–0.15)
Total Protein, Urine: 20 mg/dL

## 2020-11-07 MED ORDER — NIFEDIPINE ER 30 MG PO TB24: 30.0000 mg | ORAL_TABLET | Freq: Every day | ORAL | 3 refills | Status: DC

## 2020-11-07 MED ORDER — IBUPROFEN 800 MG PO TABS: 800.0000 mg | ORAL_TABLET | Freq: Three times a day (TID) | ORAL | 0 refills | Status: DC

## 2020-11-07 NOTE — Progress Notes (Addendum)
Postpartum Note Day #1  S:  Patient doing well.  Pain controlled.  Tolerating regular diet.   Ambulating and voiding without difficulty.   Denies fevers, chills, chest pain, SOB, N/V, or worsening bilateral LE edema.  Lochia: Minimal Infant feeding:  Breast Circumcision:  Desires Contraception:  None  O: Temp:  [98.4 F (36.9 C)-101.9 F (38.8 C)] 98.4 F (36.9 C) (02/10 0553) Pulse Rate:  [69-115] 86 (02/10 0553) Resp:  [16-18] 16 (02/10 0553) BP: (111-167)/(60-112) 116/60 (02/10 0553) SpO2:  [98 %-100 %] 98 % (02/10 0553) Gen: NAD, pleasant and cooperative Resp: Normal work of breathing Abdomen: soft, non-distended, non-tender throughout Uterus: firm, non-tender, below umbilicus Ext: no bilateral LE edema, no bilateral calf tenderness, SCDs on and working  Labs: Recent Labs    11/06/20 1937 11/07/20 0531  HGB 10.6* 8.8*    A/P: Patient is a 35 y.o. G1P1001 PPD#1 s/p SVD  S/p SVD - Pain well controlled  - GU: UOP is adequate - GI: Tolerating regular diet - Activity: encouraged sitting up to chair and ambulation as tolerated - DVT Prophylaxis: Ambulation - Labs: stable as above  Gestational HTN - Repeat CBC and CMP unremarkable, P/C ratio 0.4 but likely skewed by lochia - Severe range Bps treated with Labetalol IV near after delivery - Suspect severe range Bps secondary to Methergine administration - Bps normal now - Medications:  Procardia 30mg  daily  Disposition:  Likely PPD#2   Drema Dallas, DO [phone] (office)

## 2020-11-07 NOTE — Anesthesia Postprocedure Evaluation (Signed)
Anesthesia Post Note  Patient: Meghan Arnold  Procedure(s) Performed: AN AD HOC LABOR EPIDURAL     Patient location during evaluation: Mother Baby Anesthesia Type: Epidural Level of consciousness: awake and alert Pain management: pain level controlled Vital Signs Assessment: post-procedure vital signs reviewed and stable Respiratory status: spontaneous breathing, nonlabored ventilation and respiratory function stable Cardiovascular status: stable Postop Assessment: no headache, no backache and epidural receding Anesthetic complications: no   No complications documented.  Last Vitals:  Vitals:   11/07/20 0230 11/07/20 0553  BP: 115/68 116/60  Pulse: 85 86  Resp: 16 16  Temp: 37.1 C 36.9 C  SpO2: 98% 98%    Last Pain:  Vitals:   11/07/20 0734  TempSrc:   PainSc: 0-No pain   Pain Goal:                   Haydon Kalmar

## 2020-11-07 NOTE — Lactation Note (Addendum)
This note was copied from a baby's chart. Lactation Consultation Note  Patient Name: Meghan Arnold [id number] Date: 11/07/2020 Reason for consult: Follow-up assessment;Primapara;1st time breastfeeding;Term;Infant weight loss;Other (Comment) (1 % weight loss , mom O + / baby O+. per mom the baby just finshed feeding 35 mins and the feeding was comfortable. per mom the baby will be circ'd this evening. LC discussed the potential sluggishness from the Tylenol and its great that he has fed.) Age:9 hours - per mom feels comfortable with latching.  Baby has fed x 5 in life of 23 hours, one wet and 3 stools . Latch score range - 8-7.   Maternal Data Has patient been taught Hand Expression?: Yes  Feeding Mother's Current Feeding Choice: Breast Milk  LATCH Score ( Latch Score by the [facility] )  Latch: Repeated attempts needed to sustain latch, nipple held in mouth throughout feeding, stimulation needed to elicit sucking reflex.  Audible Swallowing: A few with stimulation  Type of Nipple: Everted at rest and after stimulation  Comfort (Breast/Nipple): Soft / non-tender  Hold (Positioning): Assistance needed to correctly position infant at breast and maintain latch.  LATCH Score: 7   Lactation Tools Discussed/Used Tools: Pump (DEBP already set up) Breast pump type: Double-Electric Breast Pump  Interventions Interventions: Breast feeding basics reviewed  Discharge    Consult Status Consult Status: Follow-up Date: 11/08/20 Follow-up type: In-patient    [place] 11/07/2020, 5:37 PM

## 2020-11-07 NOTE — Telephone Encounter (Signed)
Received notification from [facility] regarding a prior authorization for [facility]. Authorization has been APPROVED from 11/04/20 to 11/03/21.   Authorization # [id number]  Patient fills through [place]

## 2020-11-08 NOTE — Discharge Summary (Signed)
Postpartum Discharge Summary  Date of Service updated 11/08/2020     Patient Name: Meghan Arnold DOB: [birth year] MRN: [id number]  Date of admission: 11/05/2020 Delivery date:11/06/2020  Delivering provider: Drema Dallas  Date of discharge: 11/08/2020  Admitting diagnosis: Encounter for induction of labor [Z34.90] Intrauterine pregnancy: [redacted]w[redacted]d    Secondary diagnosis:  Active Problems:   Encounter for induction of labor  Additional problems: gestational hypertension / postpartum hemorrhage     Discharge diagnosis: Term Pregnancy Delivered and Gestational Hypertension                                              Post partum procedures:None Augmentation: AROM, Cytotec and IP Foley Complications: None  Hospital course: Induction of Labor With Vaginal Delivery   35y.o. yo G1P1001 at 32w4das admitted to the hospital 11/05/2020 for induction of labor.  Indication for induction: Gestational hypertension.  Patient had an uncomplicated labor course as follows: Membrane Rupture Time/Date: 7:31 AM ,11/06/2020   Delivery Method:Vaginal, Spontaneous  Episiotomy: None  Lacerations:  None  Details of delivery can be found in separate delivery note.  Patient had a routine postpartum course. Patient is discharged home 11/08/20.  Newborn Data: Birth date:11/06/2020  Birth time:6:09 PM  Gender:Female  Living status:Living  Apgars:8 ,9  Weight:3076 g   Magnesium Sulfate received: No BMZ received: No Rhophylac:N/A MMR:N/A T-DaP:Given prenatally Flu: N/A Transfusion:No  Physical exam  Vitals:   11/07/20 0553 11/07/20 1402 11/07/20 2112 11/08/20 0500  BP: 116/60 117/75 121/74 105/74  Pulse: 86 89 78 76  Resp: '16 17 16 16  ' Temp: 98.4 F (36.9 C) 99 F (37.2 C) 98 F (36.7 C) 97.7 F (36.5 C)  TempSrc: Oral Oral Oral Oral  SpO2: 98% 98% 99% 98%  Weight:      Height:       General: alert, cooperative and no distress Lochia: appropriate Uterine Fundus: firm Incision: N/A DVT  Evaluation: No evidence of DVT seen on physical exam. Labs: Lab Results  Component Value Date   WBC 18.0 (H) 11/07/2020   HGB 8.8 (L) 11/07/2020   HCT 26.3 (L) 11/07/2020   MCV 89.5 11/07/2020   PLT 171 11/07/2020   CMP Latest Ref Rng & Units 11/06/2020  Glucose 70 - 99 mg/dL 94  BUN 6 - 20 mg/dL <5(L)  Creatinine 0.44 - 1.00 mg/dL 0.80  Sodium 135 - 145 mmol/L 133(L)  Potassium 3.5 - 5.1 mmol/L 3.8  Chloride 98 - 111 mmol/L 103  CO2 22 - 32 mmol/L 20(L)  Calcium 8.9 - 10.3 mg/dL 8.7(L)  Total Protein 6.5 - 8.1 g/dL 6.0(L)  Total Bilirubin 0.3 - 1.2 mg/dL 0.7  Alkaline Phos 38 - 126 U/L 101  AST 15 - 41 U/L 25  ALT 0 - 44 U/L 13   Edinburgh Score: No flowsheet data found.    After visit meds:  Allergies as of 11/08/2020   No Known Allergies     Medication List    TAKE these medications   aspirin EC 81 MG tablet Take 81 mg by mouth daily.   ibuprofen 800 MG tablet Commonly known as: ADVIL Take 1 tablet (800 mg total) by mouth every 8 (eight) hours.   NIFEdipine 30 MG 24 hr tablet Commonly known as: ADALAT CC Take 1 tablet (30 mg total) by mouth daily.   prenatal  multivitamin Tabs tablet Take 1 tablet by mouth at bedtime.        Discharge home in stable condition Infant Feeding: Breast Infant Disposition:home with mother Discharge instruction: per After Visit Summary and Postpartum booklet. Activity: Advance as tolerated. Pelvic rest for 6 weeks.  Diet: routine diet Anticipated Birth Control: Unsure Postpartum Appointment:1 week Additional Postpartum F/U: BP check 1 week Future Appointments: Future Appointments  Date Time Provider [place]  12/06/2020 10:15 AM Bo Merino, MD [facility] None   Follow up Visit:  Follow-up Information    Drema Dallas, DO Follow up in 1 week(s).   Specialty: Obstetrics and Gynecology Why: 1 week blood pressure check (11/15/20) and 6 week PPV. Contact information: [address] [place]  [place] [address] [phone]                   11/08/2020 Christophe Louis, MD

## 2020-11-08 NOTE — Lactation Note (Signed)
This note was copied from a baby's chart. Lactation Consultation Note  Patient Name: Meghan Arnold [id number] Date: 11/08/2020 Reason for consult: Follow-up assessment Age:36 hours   Mother pumping breast when I arrived to room. She had pumped 10ml and LC assist with hand expressing each breast to show mother that she can always get a few drops more. Total of 5 ml was fed to infant with a spoon.  Infant was placed to breast in football hold and began to cue.  Assist with latching infant. Infant latched deeper on the breast than using does per mother. Lips flanged wide with lower jaw flanged wide. Advised mother to make sure infants lips were not rolled in each latch. Infant tugging strong and observed good pattern of suckling and swallows.   Mother reports she has a electric  pump at home. She hopes to pump so she can bottle feed some. Discussed importance of double pumping . Encouraged mother to be consistent and supplement infant with hand expressed milk after each breastfeeding.   Encouraged parents to discussed option of giving DBM if infant doesn't get discharged today. Mother reports that she doesn't have a problem with using DBM. Suggested that she let her nurse know and begin supplementation.   Maternal Data Has patient been taught Hand Expression?: Yes  Feeding Mother's Current Feeding Choice: Breast Milk  LATCH Score Latch: Grasps breast easily, tongue down, lips flanged, rhythmical sucking.  Audible Swallowing: Spontaneous and intermittent  Type of Nipple: Everted at rest and after stimulation  Comfort (Breast/Nipple): Filling, red/small blisters or bruises, mild/mod discomfort  Hold (Positioning): Assistance needed to correctly position infant at breast and maintain latch.  LATCH Score: 8   Lactation Tools Discussed/Used Reason for Pumping: infant is jaundice/ supplementation Pumping frequency: q 3 hours Pumped volume: 5 mL  Interventions Interventions: Hand  express;Skin to skin;Assisted with latch;Breast compression;Adjust position;Support pillows;Position options;DEBP  Discharge Discharge Education: Engorgement and breast care;Warning signs for feeding baby Pump: Manual;DEBP  Consult Status Consult Status: Complete    Darla Lesches 11/08/2020, 12:05 PM

## 2020-11-11 LAB — SURGICAL PATHOLOGY

## 2020-11-19 NOTE — Telephone Encounter (Signed)
Left VM on patient's home phone and Wayne Sever. sent MyChart message.  Standing orders for CBC and CMP already in place.  Knox Saliva, PharmD, MPH Clinical Pharmacist (Rheumatology and Pulmonology)

## 2020-11-19 NOTE — Telephone Encounter (Signed)
Her WBC count is elevated. Her white cell count has to be normal prior to starting Cimzia. I would recommend repeating white cell count.

## 2020-11-19 NOTE — Telephone Encounter (Signed)
Dr. Estanislado Pandy - would you like for her to have labs re-drawn post-partum prior to restarting Cimzia? Delivery date 11/06/20  Patient's most recent CBC and CMP below.  CBC    Component Value Date/Time   WBC 18.0 (H) 11/07/2020 0531   RBC 2.94 (L) 11/07/2020 0531   HGB 8.8 (L) 11/07/2020 0531   HCT 26.3 (L) 11/07/2020 0531   PLT 171 11/07/2020 0531   MCV 89.5 11/07/2020 0531   MCH 29.9 11/07/2020 0531   MCHC 33.5 11/07/2020 0531   RDW 13.1 11/07/2020 0531   LYMPHSABS 1,295 11/01/2020 1016   EOSABS 190 11/01/2020 1016   BASOSABS 20 11/01/2020 1016    CMP Latest Ref Rng & Units 11/06/2020 11/05/2020 11/01/2020  Glucose 70 - 99 mg/dL 94 98 78  BUN 6 - 20 mg/dL <5(L) 5(L) 5(L)  Creatinine 0.44 - 1.00 mg/dL 0.80 0.76 0.68  Sodium 135 - 145 mmol/L 133(L) 135 137  Potassium 3.5 - 5.1 mmol/L 3.8 3.7 4.3  Chloride 98 - 111 mmol/L 103 105 105  CO2 22 - 32 mmol/L 20(L) 20(L) 24  Calcium 8.9 - 10.3 mg/dL 8.7(L) 9.1 9.2  Total Protein 6.5 - 8.1 g/dL 6.0(L) 6.9 6.4  Total Bilirubin 0.3 - 1.2 mg/dL 0.7 0.4 0.3  Alkaline Phos 38 - 126 U/L 101 128(H) -  AST 15 - 41 U/L 25 22 15   ALT 0 - 44 U/L 13 15 11    Knox Saliva, PharmD, MPH Clinical Pharmacist (Rheumatology and Pulmonology)

## 2020-11-20 ENCOUNTER — Other Ambulatory Visit: Payer: Self-pay | Admitting: *Deleted

## 2020-11-20 DIAGNOSIS — Z79899 Other long term (current) drug therapy: Secondary | ICD-10-CM

## 2020-11-20 LAB — CBC WITH DIFFERENTIAL/PLATELET
Absolute Monocytes: 340 cells/uL (ref 200–950)
Basophils Absolute: 41 cells/uL (ref 0–200)
Basophils Relative: 0.9 %
Eosinophils Absolute: 262 cells/uL (ref 15–500)
Eosinophils Relative: 5.7 %
HCT: 28.8 % — ABNORMAL LOW (ref 35.0–45.0)
Hemoglobin: 9.6 g/dL — ABNORMAL LOW (ref 11.7–15.5)
Lymphs Abs: 1467 cells/uL (ref 850–3900)
MCH: 29.4 pg (ref 27.0–33.0)
MCHC: 33.3 g/dL (ref 32.0–36.0)
MCV: 88.1 fL (ref 80.0–100.0)
MPV: 8.8 fL (ref 7.5–12.5)
Monocytes Relative: 7.4 %
Neutro Abs: 2489 cells/uL (ref 1500–7800)
Neutrophils Relative %: 54.1 %
Platelets: 340 10*3/uL (ref 140–400)
RBC: 3.27 10*6/uL — ABNORMAL LOW (ref 3.80–5.10)
RDW: 12.3 % (ref 11.0–15.0)
Total Lymphocyte: 31.9 %
WBC: 4.6 10*3/uL (ref 3.8–10.8)

## 2020-11-21 NOTE — Telephone Encounter (Signed)
Attempted to call patient to schedule Cimzia re-start, however VM box is full. MyChart message was sent this morning.

## 2020-11-21 NOTE — Progress Notes (Signed)
WBC is back to normal.  It is okay for her to restart Cimzia.  Anemia persists.

## 2020-11-22 NOTE — Progress Notes (Signed)
Office Visit Note  Patient: Meghan Arnold             Date of Birth: 1985/10/29           MRN: [id number]             PCP: Patient, No Pcp Per Referring: No ref. provider found Visit Date: 12/06/2020 Occupation: '@GUAROCC' @  Subjective:  Medication management.   History of Present Illness: Meghan Arnold is a 35 y.o. female with a history of seropositive rheumatoid arthritis.  She stopped Cimzia when she got pregnant.  She states in the last month of her pregnancy she started having intermittent pain and swelling in her hands.  She is 1 month postpartum now.  She states she is continues to have intermittent swelling.  Today's a good day and she is not having any swelling.  None of the other joints are painful.  She is not using any contraception and plans to get pregnant again in the future.  Activities of Daily Living:  Patient reports morning stiffness for 0 minutes.   Patient Denies nocturnal pain.  Difficulty dressing/grooming: Denies Difficulty climbing stairs: Denies Difficulty getting out of chair: Denies Difficulty using hands for taps, buttons, cutlery, and/or writing: Denies  Review of Systems  Constitutional: Negative for fatigue.  HENT: Negative for mouth sores, mouth dryness and nose dryness.   Eyes: Negative for pain, itching and dryness.  Respiratory: Negative for shortness of breath and difficulty breathing.   Cardiovascular: Negative for chest pain and palpitations.  Gastrointestinal: Negative for blood in stool, constipation and diarrhea.  Endocrine: Negative for increased urination.  Genitourinary: Negative for difficulty urinating.  Musculoskeletal: Negative for arthralgias, joint pain, joint swelling, myalgias, morning stiffness, muscle tenderness and myalgias.  Skin: Negative for color change, rash and redness.  Allergic/Immunologic: Negative for susceptible to infections.  Neurological: Negative for dizziness, numbness, headaches, memory loss and  weakness.  Hematological: Negative for bruising/bleeding tendency.  Psychiatric/Behavioral: Negative for confusion.    PMFS History:  Patient Active Problem List   Diagnosis Date Noted  . Encounter for induction of labor 11/05/2020  . High risk medication use 12/03/2017  . Raynaud's disease without gangrene 12/03/2017  . Vitamin D deficiency 12/03/2017  . Infertility counseling 07/02/2017  . Fertility testing 05/04/2017  . Seropositive rheumatoid arthritis ([place]) 11/17/2016  . Arthralgia of multiple joints 11/17/2016  . CRP elevated 11/17/2016  . Elevated erythrocyte sedimentation rate 11/17/2016  . Raynaud's phenomenon without gangrene 11/17/2016    Past Medical History:  Diagnosis Date  . Benign tumor of ovary 2013  . Chlamydia   . Family history of breast cancer   . Raynaud's phenomenon without gangrene    mgmt rheum Dr Annalee Genta  . Rheumatoid arthritis ([place]) 2017  . Wears contact lenses     Family History  Problem Relation Age of Onset  . Colon cancer Maternal Grandfather   . Breast cancer Maternal Grandmother 72  . Breast cancer Other 7  . Breast cancer Cousin 18  . Healthy Son    Past Surgical History:  Procedure Laterality Date  . LAPAROSCOPIC SALPINGOOPHERECTOMY  2013   done by dr. Kerin Perna , believes right side laterality   . LAPAROSCOPIC UNILATERAL SALPINGECTOMY Right 12/01/2017   Procedure: LAPAROSCOPIC LYSIS OF ADHESIONS, RIGHT TUBAL OVARIOLYSIS, RIGHT OVARIAN CYST ASPIRATION,CHROMOPERTUBATION, RIGHT FIMBRIOPLASTY;  Surgeon: Governor Specking, MD;  [facility];  Service: Gynecology;  Laterality: Right;  . LAPAROSCOPY    . OVARIAN CYST REMOVAL  08/2012  dr Glennon Mac at [facility] in [place] ; has abdominal insision    Social History   Social History Narrative  . Not on file   Immunization History  Administered Date(s) Administered  . Tdap 08/11/2016     Objective: Vital Signs: BP (!) 144/88 (BP Location: Left Arm,  Patient Position: Sitting, Cuff Size: Normal)   Pulse 69   Ht '5\' 5"'  (1.651 m)   Wt 163 lb 9.6 oz (74.2 kg)   BMI 27.22 kg/m    Physical Exam Vitals and nursing note reviewed.  Constitutional:      Appearance: She is well-developed.  HENT:     Head: Normocephalic and atraumatic.  Eyes:     Conjunctiva/sclera: Conjunctivae normal.  Cardiovascular:     Rate and Rhythm: Normal rate and regular rhythm.     Heart sounds: Normal heart sounds.  Pulmonary:     Effort: Pulmonary effort is normal.     Breath sounds: Normal breath sounds.  Abdominal:     General: Bowel sounds are normal.     Palpations: Abdomen is soft.  Musculoskeletal:     Cervical back: Normal range of motion.  Lymphadenopathy:     Cervical: No cervical adenopathy.  Skin:    General: Skin is warm and dry.     Capillary Refill: Capillary refill takes less than 2 seconds.  Neurological:     Mental Status: She is alert and oriented to person, place, and time.  Psychiatric:        Behavior: Behavior normal.      Musculoskeletal Exam: C-spine was in good range of motion.  Shoulder joints, elbow joints, wrist joints with good range of motion.  She has synovial thickening over right second and third MCP joint and left second MCP joint.  No synovitis was noted.  Hip joints, knee joints, ankles, MTPs with good range of motion with no synovitis.  CDAI Exam: CDAI Score: 0  Patient Global: 0 mm; Provider Global: 0 mm Swollen: 0 ; Tender: 0  Joint Exam 12/06/2020   No joint exam has been documented for this visit   There is currently no information documented on the homunculus. Go to the Rheumatology activity and complete the homunculus joint exam.  Investigation: No additional findings.  Imaging: No results found.  Recent Labs: Lab Results  Component Value Date   WBC 4.6 11/20/2020   HGB 9.6 (L) 11/20/2020   PLT 340 11/20/2020   NA 133 (L) 11/06/2020   K 3.8 11/06/2020   CL 103 11/06/2020   CO2 20 (L)  11/06/2020   GLUCOSE 94 11/06/2020   BUN <5 (L) 11/06/2020   CREATININE 0.80 11/06/2020   BILITOT 0.7 11/06/2020   ALKPHOS 101 11/06/2020   AST 25 11/06/2020   ALT 13 11/06/2020   PROT 6.0 (L) 11/06/2020   ALBUMIN 2.7 (L) 11/06/2020   CALCIUM 8.7 (L) 11/06/2020   GFRAA 131 11/01/2020   QFTBGOLDPLUS NEGATIVE 11/13/2019    Speciality Comments: No specialty comments available.  Procedures:  No procedures performed Allergies: Patient has no known allergies.   Assessment / Plan:     Visit Diagnoses: Rheumatoid arthritis involving multiple sites with positive rheumatoid factor (HCC) - Positive RF and positive anti-CCP , erosive changes in hands and feet.  She has synovial thickening in her bilateral hands.  No synovitis was noted.  She gives history of intermittent swelling.  She is 1 month postpartum.  She is planning to get pregnant again in the future.  She is  not using any contraception.  We will resume Cimzia.  Indications side effects contraindications were again reviewed.  She will be self injecting Cimzia in the office and we will observe her for 30 minutes.  She will continue Cimzia at home after that.  She was given the first dose of Cimzia in the office today.  She was observed in the office for 30 minutes.  No adverse events were noted.  High risk medication use - Holding Cimzia during pregnancy.  D/c SSZ-low WBC count - Plan: QuantiFERON-TB Gold Plus will be drawn with next lab in a month.  Have advised her to get labs in 1 month and then every 3 months to monitor for drug toxicity.  She is currently anemic.  Raynaud's disease without gangrene-not very active.  Chronic pain of left knee-doing better.  Vitamin D deficiency-she is on vitamin D supplement.  Educate about COVID-19 virus infection-information regarding the COVID-19 vaccination was given.  Instructions were placed in the AVS.  Orders: Orders Placed This Encounter  Procedures  . QuantiFERON-TB Gold Plus   Meds  ordered this encounter  Medications  . Certolizumab Pegol (CIMZIA PREFILLED) 2 X 200 MG/ML KIT    Sig: Inject 429m into the skin at Week 2 and Week 4, then inject 2058mevery 2 weeks thereafter. (Note: Week 0 dose of 4001meceived in clinic on 12/06/20).    Dispense:  2 kit    Refill:  1    Patient received Week 0 dose in clinic on 12/06/20.    Follow-Up Instructions: Return in about 3 months (around 03/08/2021) for Rheumatoid arthritis.   ShaBo MerinoD  Note - This record has been created using DraEditor, commissioningChart creation errors have been sought, but may not always  have been located. Such creation errors do not reflect on  the standard of medical care.

## 2020-12-06 ENCOUNTER — Other Ambulatory Visit: Payer: Self-pay

## 2020-12-06 ENCOUNTER — Encounter: Payer: Self-pay | Admitting: Rheumatology

## 2020-12-06 ENCOUNTER — Ambulatory Visit (INDEPENDENT_AMBULATORY_CARE_PROVIDER_SITE_OTHER): Payer: BC Managed Care – PPO | Admitting: Rheumatology

## 2020-12-06 VITALS — BP 144/88 | HR 69 | Ht 65.0 in | Wt 163.6 lb

## 2020-12-06 DIAGNOSIS — M0579 Rheumatoid arthritis with rheumatoid factor of multiple sites without organ or systems involvement: Secondary | ICD-10-CM | POA: Diagnosis not present

## 2020-12-06 DIAGNOSIS — M25562 Pain in left knee: Secondary | ICD-10-CM | POA: Diagnosis not present

## 2020-12-06 DIAGNOSIS — Z79899 Other long term (current) drug therapy: Secondary | ICD-10-CM

## 2020-12-06 DIAGNOSIS — G8929 Other chronic pain: Secondary | ICD-10-CM

## 2020-12-06 DIAGNOSIS — I7300 Raynaud's syndrome without gangrene: Secondary | ICD-10-CM | POA: Diagnosis not present

## 2020-12-06 DIAGNOSIS — E559 Vitamin D deficiency, unspecified: Secondary | ICD-10-CM

## 2020-12-06 MED ORDER — CIMZIA PREFILLED 2 X 200 MG/ML ~~LOC~~ KIT: PACK | SUBCUTANEOUS | 1 refills | Status: DC

## 2020-12-06 NOTE — Progress Notes (Signed)
Pharmacy Note  Subjective:   Patient presents to clinic today to receive first does of Cimzia at [facility] with Dr. Estanislado Pandy. Patient had held Cimzia for >3 months while pregnant, therefore will have to reload. She was taking Cimzia as monotherapy. Patient is post-partum (delivery 11/06/20). Labs were re-drawn post-partum.  Patient takes Cimzia as monotherapy for rheumatoid arthritis. The plan was for her to restart Cimzia prior to labor induction but patient was induced earlier than anticipated.  Patient running a fever or have signs/symptoms of infection? No  Patient currently on antibiotics for the treatment of infection? No  Patient have any upcoming invasive procedures/surgeries? No  Objective: CMP     Component Value Date/Time   NA 133 (L) 11/06/2020 1935   K 3.8 11/06/2020 1935   CL 103 11/06/2020 1935   CO2 20 (L) 11/06/2020 1935   GLUCOSE 94 11/06/2020 1935   BUN <5 (L) 11/06/2020 1935   CREATININE 0.80 11/06/2020 1935   CREATININE 0.68 11/01/2020 1016   CALCIUM 8.7 (L) 11/06/2020 1935   PROT 6.0 (L) 11/06/2020 1935   ALBUMIN 2.7 (L) 11/06/2020 1935   AST 25 11/06/2020 1935   ALT 13 11/06/2020 1935   ALKPHOS 101 11/06/2020 1935   BILITOT 0.7 11/06/2020 1935   GFRNONAA >60 11/06/2020 1935   GFRNONAA 113 11/01/2020 1016   GFRAA 131 11/01/2020 1016    CBC    Component Value Date/Time   WBC 4.6 11/20/2020 1335   RBC 3.27 (L) 11/20/2020 1335   HGB 9.6 (L) 11/20/2020 1335   HCT 28.8 (L) 11/20/2020 1335   PLT 340 11/20/2020 1335   MCV 88.1 11/20/2020 1335   MCH 29.4 11/20/2020 1335   MCHC 33.3 11/20/2020 1335   RDW 12.3 11/20/2020 1335   LYMPHSABS 1,467 11/20/2020 1335   EOSABS 262 11/20/2020 1335   BASOSABS 41 11/20/2020 1335    Baseline Immunosuppressant Therapy Labs TB GOLD Quantiferon TB Gold Latest Ref Rng & Units 11/13/2019  Quantiferon TB Gold Plus NEGATIVE NEGATIVE   Hepatitis Panel - Hep C screen negative   HIV Lab Results  Component Value Date   HIV  NON-REACTIVE 11/22/2017   Immunoglobulins Immunoglobulin Electrophoresis Latest Ref Rng & Units 11/22/2017  IgA  81 - 463 mg/dL 345  IgG 694 - 1,618 mg/dL 1,176  IgM 48 - 271 mg/dL 224   SPEP Serum Protein Electrophoresis Latest Ref Rng & Units 11/06/2020  Total Protein 6.5 - 8.1 g/dL 6.0(L)  Albumin 3.8 - 4.8 g/dL -  Alpha-1 0.2 - 0.3 g/dL -  Alpha-2 0.5 - 0.9 g/dL -  Beta Globulin 0.4 - 0.6 g/dL -  Beta 2 0.2 - 0.5 g/dL -  Gamma Globulin 0.8 - 1.7 g/dL -   G6PD Lab Results  Component Value Date   G6PDH 14.7 03/14/2018   Chest x-ray: 01/04/2018 - wnl  Assessment/Plan:  Demonstrated proper injection technique with Cimzia PFS demo device  Patient able to demonstrate proper injection technique using the teach back method.  Patient self injected in the right lower abdomen and left lower abdom with:  Sample Medication: Cimzia NDC: 46503-546-56 Lot: [id number] Expiration: 11/25/2021  Patient's dose will be 400mg  at Week 2, 400mg  at Week 4, then 200mg  every 2 weeks thereafter. Patient will take Cimzia as monotherapy.  Patient tolerated well.  Observed for 30 mins in office for adverse reaction.   Patient is to return in 1 month for labs and 6-8 weeks for follow-up appointment.  Standing orders placed.   Cimzia approved through  insurance (Medicaid and [place]).  Prescription sent to [facility]. Patient provided withpharmacy phone number and dosing instructions in AVS   All questions encouraged and answered.  Instructed patient to call with any further questions or concerns.  Knox Saliva, PharmD, MPH Clinical Pharmacist (Rheumatology and Pulmonology)  12/06/2020 11:21 AM

## 2020-12-06 NOTE — Patient Instructions (Addendum)
Your Cimzia dose will be:  400mg  as two divided doses on 12/20/20  400mg  as two divided doses on 01/03/21.  Then 200mg  (1 injection) every 2 weeks thereafter.  Cimzia prescription sent to [facility]: [phone]  Remember the 5 C's:  COUNTER - leave on the counter at least 30 minutes but up to overnight to bring medication to room temperature. This may help prevent stinging  COLD - place something cold (like an ice gel pack or cold water bottle) on the injection site just before cleansing with alcohol. This may help reduce pain  CLARITIN - use Claritin (generic name is loratadine) for the first two weeks of treatment or the day of, the day before, and the day after injecting. This will help to minimize injection site reactions  CORTISONE CREAM - apply if injection site is irritated and itching  CALL ME - if injection site reaction is bigger than the size of your fist, looks infected, blisters, or if you develop hives    Standing Labs We placed an order today for your standing lab work.   Please have your standing labs drawn in April and every 3 months  TB Gold with next labs  If possible, please have your labs drawn 2 weeks prior to your appointment so that the provider can discuss your results at your appointment.  We have open lab daily Monday through Thursday from 1:30-4:30 PM and Friday from 1:30-4:00 PM at the office of Dr. Bo Merino, [facility].   Please be advised, all patients with office appointments requiring lab work will take precedents over walk-in lab work.  If possible, please come for your lab work on Monday and Friday afternoons, as you may experience shorter wait times. The office is located at [address], [place], [place], [place] [address] No appointment is necessary.   Labs are drawn by [facility]. Please bring your co-pay at the time of your lab draw.  You may receive a bill from [facility] for your lab work.  If you wish to have your  labs drawn at another location, please call the office 24 hours in advance to send orders.  If you have any questions regarding directions or hours of operation,  please call [phone].   As a reminder, please drink plenty of water prior to coming for your lab work. Thanks!  COVID-19 vaccine recommendations:   COVID-19 vaccine is recommended for everyone (unless you are allergic to a vaccine component), even if you are on a medication that suppresses your immune system.   The recommendations are that patients on immunosuppressive agents should receive COVID-19 vaccination first 3 doses 1 month apart and a fourth dose (booster) 6 months after the third dose.  Do not take Tylenol or any anti-inflammatory medications (NSAIDs) 24 hours prior to the COVID-19 vaccination.   There is no direct evidence about the efficacy of the COVID-19 vaccine in individuals who are on medications that suppress the immune system.   Even if you are fully vaccinated, and you are on any medications that suppress your immune system, please continue to wear a mask, maintain at least six feet social distance and practice hand hygiene.   If you develop a COVID-19 infection, please contact your PCP or our office to determine if you need monoclonal antibody infusion.  The booster vaccine is now available for immunocompromised patients.   Please see the following web sites for updated information.   [email or web address]   Heart Disease Prevention   Your inflammatory  disease increases your risk of heart disease which includes heart attack, stroke, atrial fibrillation (irregular heartbeats), high blood pressure, heart failure and atherosclerosis (plaque in the arteries).  It is important to reduce your risk by:   . Keep blood pressure, cholesterol, and blood sugar at healthy levels   . Smoking Cessation   . Maintain a healthy weight  o BMI 20-25    . Eat a healthy diet  o Plenty of fresh fruit, vegetables, and whole grains  o Limit saturated fats, foods high in sodium, and added sugars  o DASH and Mediterranean diet   . Increase physical activity  o Recommend moderate physically activity for 150 minutes per week/ 30 minutes a day for five days a week These can be broken up into three separate ten-minute sessions during the day.   . Reduce Stress  . Meditation, slow breathing exercises, yoga, coloring books  . Dental visits twice a year

## 2020-12-09 ENCOUNTER — Telehealth: Payer: Self-pay | Admitting: Pharmacy Technician

## 2020-12-09 NOTE — Telephone Encounter (Signed)
Received PA request for Cimzia from [facility].  Submitted an urgent Prior Authorization request to [facility] for [place] via Cover My Meds. Will update once we receive a response.   Key: [place]

## 2020-12-10 NOTE — Telephone Encounter (Signed)
Received quantitiy limit request form from [facility] for Cimzia loading. Faxed back. Awaiting response.

## 2020-12-11 NOTE — Telephone Encounter (Addendum)
Received notification from [facility] regarding a prior authorization for [facility]. Authorization has been APPROVED from 11/04/20 to 11/03/21.  Faxed approval letter to [place] as Juluis Rainier.  Authorization # [id number] Phone # [phone]  Knox Saliva, PharmD, MPH Clinical Pharmacist (Rheumatology and Pulmonology)

## 2020-12-12 NOTE — Telephone Encounter (Signed)
Ran test claim, [facility] still has not processed rx. Called [facility], they received a paid claim.- zero copay.  Phone# [phone]  Can you update patient's Cimzia so for future it can get escribed to [facility] branch?

## 2021-02-25 NOTE — Progress Notes (Signed)
Office Visit Note  Patient: Meghan Arnold             Date of Birth: 06-15-1986           MRN: [id number]             PCP: Patient, No Pcp Per (Inactive) Referring: No ref. provider found Visit Date: 03/11/2021 Occupation: @GUAROCC @  Subjective:  Medication monitoring   History of Present Illness: Meghan Arnold is a 35 y.o. female with history of seropositive rheumatoid arthritis.  Patient is on Cimzia 200 mg subcutaneous injections every 2 weeks.  She denies any injection site reactions or side effects.  She has not missed any doses of Cimzia recently.  She denies any recent rheumatoid arthritis flares.  She is not having any joint pain, joint swelling, morning stiffness, nocturnal discomfort.  She has not had any difficulty with ADLs. She has not had any recent infections. She needs to establish care with a PCP.    Activities of Daily Living:  Patient reports morning stiffness for 0 minutes.   Patient Denies nocturnal pain.  Difficulty dressing/grooming: Denies Difficulty climbing stairs: Denies Difficulty getting out of chair: Denies Difficulty using hands for taps, buttons, cutlery, and/or writing: Denies  Review of Systems  Constitutional:  Negative for fatigue.  HENT:  Negative for mouth sores, mouth dryness and nose dryness.   Eyes:  Negative for pain, itching and dryness.  Respiratory:  Negative for shortness of breath and difficulty breathing.   Cardiovascular:  Negative for chest pain and palpitations.  Gastrointestinal:  Negative for blood in stool, constipation and diarrhea.  Endocrine: Negative for increased urination.  Genitourinary:  Negative for difficulty urinating.  Musculoskeletal:  Negative for joint pain, joint pain, joint swelling, myalgias, morning stiffness, muscle tenderness and myalgias.  Skin:  Negative for color change, rash and redness.  Allergic/Immunologic: Negative for susceptible to infections.  Neurological:  Negative for dizziness,  numbness, headaches, memory loss and weakness.  Hematological:  Negative for bruising/bleeding tendency.  Psychiatric/Behavioral:  Negative for confusion.    PMFS History:  Patient Active Problem List   Diagnosis Date Noted   Encounter for induction of labor 11/05/2020   High risk medication use 12/03/2017   Raynaud's disease without gangrene 12/03/2017   Vitamin D deficiency 12/03/2017   Infertility counseling 07/02/2017   Fertility testing 05/04/2017   Seropositive rheumatoid arthritis ([place]) 11/17/2016   Arthralgia of multiple joints 11/17/2016   CRP elevated 11/17/2016   Elevated erythrocyte sedimentation rate 11/17/2016   Raynaud's phenomenon without gangrene 11/17/2016    Past Medical History:  Diagnosis Date   Benign tumor of ovary 2013   Chlamydia    Family history of breast cancer    Raynaud's phenomenon without gangrene    mgmt rheum Dr Annalee Genta   Rheumatoid arthritis ([place]) 2017   Wears contact lenses     Family History  Problem Relation Age of Onset   Colon cancer Maternal Grandfather    Breast cancer Maternal Grandmother 72   Breast cancer Other 65   Breast cancer Cousin 75   Healthy Son    Past Surgical History:  Procedure Laterality Date   LAPAROSCOPIC SALPINGOOPHERECTOMY  2013   done by dr. Kerin Perna , believes right side laterality    LAPAROSCOPIC UNILATERAL SALPINGECTOMY Right 12/01/2017   Procedure: LAPAROSCOPIC LYSIS OF ADHESIONS, RIGHT TUBAL OVARIOLYSIS, RIGHT OVARIAN CYST ASPIRATION,CHROMOPERTUBATION, RIGHT FIMBRIOPLASTY;  Surgeon: Governor Specking, MD;  Location: [facility];  Service: Gynecology;  Laterality: Right;  LAPAROSCOPY     OVARIAN CYST REMOVAL  08/2012   dr Glennon Mac at [facility] in [place] ; has abdominal insision    Social History   Social History Narrative   Not on file   Immunization History  Administered Date(s) Administered   Tdap 08/11/2016     Objective: Vital Signs: BP 119/76 (BP Location:  Left Arm, Patient Position: Sitting, Cuff Size: Normal)   Pulse 71   Ht 5\' 5"  (1.651 m)   Wt 150 lb 6.4 oz (68.2 kg)   BMI 25.03 kg/m    Physical Exam Vitals and nursing note reviewed.  Constitutional:      Appearance: She is well-developed.  HENT:     Head: Normocephalic and atraumatic.  Eyes:     Conjunctiva/sclera: Conjunctivae normal.  Pulmonary:     Effort: Pulmonary effort is normal.  Abdominal:     Palpations: Abdomen is soft.  Musculoskeletal:     Cervical back: Normal range of motion.  Skin:    General: Skin is warm and dry.     Capillary Refill: Capillary refill takes less than 2 seconds.  Neurological:     Mental Status: She is alert and oriented to person, place, and time.  Psychiatric:        Behavior: Behavior normal.     Musculoskeletal Exam: C-spine, thoracic spine, lumbar spine have good range of motion without discomfort.  Shoulder joints, elbow joints, wrist joints, MCPs, PIPs, DIPs have good range of motion with no synovitis.  She was able to make a complete fist bilaterally.  Hip joints, knee joints, and ankle joints have good range of motion with no synovitis.  No warmth or effusion of bilateral knee joints.  No tenderness or swelling of ankle joints  CDAI Exam: CDAI Score: 0  Patient Global: 0 mm; Provider Global: 0 mm Swollen: 0 ; Tender: 0  Joint Exam 03/11/2021   No joint exam has been documented for this visit   There is currently no information documented on the homunculus. Go to the Rheumatology activity and complete the homunculus joint exam.  Investigation: No additional findings.  Imaging: No results found.  Recent Labs: Lab Results  Component Value Date   WBC 4.6 11/20/2020   HGB 9.6 (L) 11/20/2020   PLT 340 11/20/2020   NA 133 (L) 11/06/2020   K 3.8 11/06/2020   CL 103 11/06/2020   CO2 20 (L) 11/06/2020   GLUCOSE 94 11/06/2020   BUN <5 (L) 11/06/2020   CREATININE 0.80 11/06/2020   BILITOT 0.7 11/06/2020   ALKPHOS 101  11/06/2020   AST 25 11/06/2020   ALT 13 11/06/2020   PROT 6.0 (L) 11/06/2020   ALBUMIN 2.7 (L) 11/06/2020   CALCIUM 8.7 (L) 11/06/2020   GFRAA 131 11/01/2020   QFTBGOLDPLUS NEGATIVE 11/13/2019    Speciality Comments: No specialty comments available.  Procedures:  No procedures performed Allergies: Patient has no known allergies.   Assessment / Plan:     Visit Diagnoses: Rheumatoid arthritis involving multiple sites with positive rheumatoid factor (HCC) - Positive RF and positive anti-CCP , erosive changes in hands and feet: She has no joint tenderness or synovitis on examination today.  She has not had any signs or symptoms of a flare since restarting on Cimzia on 12/06/2020.  She has been injecting Cimzia 200 mg subcutaneously every 2 weeks.  She has not missed any doses recently and is tolerating without any side effects or injection site reactions.  She is not experiencing any  morning stiffness, nocturnal pain, or difficulty with ADLs.  She will remain on the current treatment regimen.  She was advised to notify us if she develops increased joint pain or joint swelling.  She will follow-up in the office in 5 months.  High risk medication use - Cimzia 200 mg sq injections every 2 weeks. D/c SSZ-low WBC count.  CBC and CMP were drawn on 11/06/2020.  She is due to update lab work.  Orders for CBC and CMP were released.  Her next lab work will be due in September and every 3 months to monitor for drug toxicity.  TB Gold negative on 11/13/2019.  Order for TB gold was released today.- Plan: CBC with Differential/Platelet, COMPLETE METABOLIC PANEL WITH GFR She has not had any infections recently.  We discussed the importance of holding Cimzia if she develops signs or symptoms of an infection and to resume once the infection has completely cleared. We discussed the importance of yearly skin exams while on Cimzia. She was strongly encouraged to establish care with a PCP.  Raynaud's disease without  gangrene: Not currently active.  No signs of sclerodactyly noted.  Chronic pain of left knee: Resolved.  She has good range of motion with no discomfort at this time.  No warmth or effusion was noted.  Vitamin D deficiency: She continues to take a vitamin D supplement daily.  Screening for tuberculosis -order for TB gold was released today.  Plan: QuantiFERON-TB Gold Plus  Orders: Orders Placed This Encounter  Procedures   CBC with Differential/Platelet   COMPLETE METABOLIC PANEL WITH GFR   QuantiFERON-TB Gold Plus   No orders of the defined types were placed in this encounter.    Follow-Up Instructions: Return in about 5 months (around 08/11/2021) for Rheumatoid arthritis.   Ofilia Neas, PA-C  Note - This record has been created using Dragon software.  Chart creation errors have been sought, but may not always  have been located. Such creation errors do not reflect on  the standard of medical care.

## 2021-03-11 ENCOUNTER — Encounter: Payer: Self-pay | Admitting: Physician Assistant

## 2021-03-11 ENCOUNTER — Other Ambulatory Visit: Payer: Self-pay

## 2021-03-11 ENCOUNTER — Ambulatory Visit (INDEPENDENT_AMBULATORY_CARE_PROVIDER_SITE_OTHER): Payer: BC Managed Care – PPO | Admitting: Physician Assistant

## 2021-03-11 VITALS — BP 119/76 | HR 71 | Ht 65.0 in | Wt 150.4 lb

## 2021-03-11 DIAGNOSIS — Z111 Encounter for screening for respiratory tuberculosis: Secondary | ICD-10-CM

## 2021-03-11 DIAGNOSIS — M0579 Rheumatoid arthritis with rheumatoid factor of multiple sites without organ or systems involvement: Secondary | ICD-10-CM | POA: Diagnosis not present

## 2021-03-11 DIAGNOSIS — I7300 Raynaud's syndrome without gangrene: Secondary | ICD-10-CM

## 2021-03-11 DIAGNOSIS — M25562 Pain in left knee: Secondary | ICD-10-CM

## 2021-03-11 DIAGNOSIS — Z79899 Other long term (current) drug therapy: Secondary | ICD-10-CM | POA: Diagnosis not present

## 2021-03-11 DIAGNOSIS — E559 Vitamin D deficiency, unspecified: Secondary | ICD-10-CM

## 2021-03-11 DIAGNOSIS — G8929 Other chronic pain: Secondary | ICD-10-CM

## 2021-03-11 NOTE — Patient Instructions (Signed)
Standing Labs We placed an order today for your standing lab work.   Please have your standing labs drawn in September and every 3 months  If possible, please have your labs drawn 2 weeks prior to your appointment so that the provider can discuss your results at your appointment.  Please note that you may see your imaging and lab results in MyChart before we have reviewed them. We may be awaiting multiple results to interpret others before contacting you. Please allow our office up to 72 hours to thoroughly review all of the results before contacting the office for clarification of your results.  We have open lab daily: Monday through Thursday from 1:30-4:30 PM and Friday from 1:30-4:00 PM at the office of Dr. Shaili Deveshwar, [facility].   Please be advised, all patients with office appointments requiring lab work will take precedent over walk-in lab work.  If possible, please come for your lab work on Monday and Friday afternoons, as you may experience shorter wait times. The office is located at [address], [address], [place], [place] [address] No appointment is necessary.   Labs are drawn by [facility]. Please bring your co-pay at the time of your lab draw.  You may receive a bill from [facility] for your lab work.  If you wish to have your labs drawn at another location, please call the office 24 hours in advance to send orders.  If you have any questions regarding directions or hours of operation,  please call [phone].   As a reminder, please drink plenty of water prior to coming for your lab work. Thanks! 

## 2021-03-12 NOTE — Progress Notes (Signed)
CMP WNL.  Hemoglobin remains slightly low but has improved.  We will continue to monitor.   No change in therapy at this time.

## 2021-03-14 LAB — COMPLETE METABOLIC PANEL WITH GFR
AG Ratio: 1.5 (calc) (ref 1.0–2.5)
ALT: 8 U/L (ref 6–29)
AST: 12 U/L (ref 10–30)
Albumin: 4.5 g/dL (ref 3.6–5.1)
Alkaline phosphatase (APISO): 63 U/L (ref 31–125)
BUN: 16 mg/dL (ref 7–25)
CO2: 28 mmol/L (ref 20–32)
Calcium: 9.9 mg/dL (ref 8.6–10.2)
Chloride: 104 mmol/L (ref 98–110)
Creat: 0.72 mg/dL (ref 0.50–1.10)
GFR, Est African American: 125 mL/min/{1.73_m2} (ref >=60)
GFR, Est Non African American: 108 mL/min/{1.73_m2} (ref >=60)
Globulin: 3 g/dL (calc) (ref 1.9–3.7)
Glucose, Bld: 70 mg/dL (ref 65–99)
Potassium: 4.2 mmol/L (ref 3.5–5.3)
Sodium: 139 mmol/L (ref 135–146)
Total Bilirubin: 0.3 mg/dL (ref 0.2–1.2)
Total Protein: 7.5 g/dL (ref 6.1–8.1)

## 2021-03-14 LAB — CBC WITH DIFFERENTIAL/PLATELET
Absolute Monocytes: 492 cells/uL (ref 200–950)
Basophils Absolute: 49 cells/uL (ref 0–200)
Basophils Relative: 1.2 %
Eosinophils Absolute: 279 cells/uL (ref 15–500)
Eosinophils Relative: 6.8 %
HCT: 36.1 % (ref 35.0–45.0)
Hemoglobin: 11.3 g/dL — ABNORMAL LOW (ref 11.7–15.5)
Lymphs Abs: 1714 cells/uL (ref 850–3900)
MCH: 26.2 pg — ABNORMAL LOW (ref 27.0–33.0)
MCHC: 31.3 g/dL — ABNORMAL LOW (ref 32.0–36.0)
MCV: 83.6 fL (ref 80.0–100.0)
MPV: 9.7 fL (ref 7.5–12.5)
Monocytes Relative: 12 %
Neutro Abs: 1566 cells/uL (ref 1500–7800)
Neutrophils Relative %: 38.2 %
Platelets: 273 10*3/uL (ref 140–400)
RBC: 4.32 10*6/uL (ref 3.80–5.10)
RDW: 14.3 % (ref 11.0–15.0)
Total Lymphocyte: 41.8 %
WBC: 4.1 10*3/uL (ref 3.8–10.8)

## 2021-03-14 LAB — QUANTIFERON-TB GOLD PLUS
Mitogen-NIL: 2.32 IU/mL
NIL: 0.05 IU/mL
QuantiFERON-TB Gold Plus: NEGATIVE
TB1-NIL: 0 IU/mL
TB2-NIL: 0 IU/mL

## 2021-03-14 NOTE — Progress Notes (Signed)
TB gold negative

## 2021-04-16 ENCOUNTER — Other Ambulatory Visit: Payer: Self-pay | Admitting: *Deleted

## 2021-04-16 MED ORDER — CERTOLIZUMAB PEGOL 2 X 200 MG/ML ~~LOC~~ PSKT: 200.0000 mg | PREFILLED_SYRINGE | SUBCUTANEOUS | 2 refills | Status: DC

## 2021-04-16 NOTE — Telephone Encounter (Signed)
Refill request received via fax  Next Visit: 08/12/2021  Last Visit: 03/11/2021  Last Fill: 12/06/2020  RM:BOBOFPULGS arthritis involving multiple sites with positive rheumatoid factor  Current Dose per office note 03/11/2021: Cimzia 200 mg sq injections every 2 weeks  Labs: 03/11/2021 CMP WNL.  Hemoglobin remains slightly low but has improved.  We will continueto monitor.    TB Gold: 03/11/2021 Neg    Okay to refill Cimzia?

## 2021-05-27 DIAGNOSIS — L918 Other hypertrophic disorders of the skin: Secondary | ICD-10-CM | POA: Diagnosis not present

## 2021-05-27 DIAGNOSIS — L700 Acne vulgaris: Secondary | ICD-10-CM | POA: Diagnosis not present

## 2021-06-25 DIAGNOSIS — Z20822 Contact with and (suspected) exposure to covid-19: Secondary | ICD-10-CM | POA: Diagnosis not present

## 2021-07-08 DIAGNOSIS — L818 Other specified disorders of pigmentation: Secondary | ICD-10-CM | POA: Diagnosis not present

## 2021-07-08 DIAGNOSIS — L700 Acne vulgaris: Secondary | ICD-10-CM | POA: Diagnosis not present

## 2021-07-29 ENCOUNTER — Encounter: Payer: Self-pay | Admitting: *Deleted

## 2021-07-29 ENCOUNTER — Other Ambulatory Visit: Payer: Self-pay | Admitting: *Deleted

## 2021-07-29 MED ORDER — CERTOLIZUMAB PEGOL 2 X 200 MG/ML ~~LOC~~ PSKT: 200.0000 mg | PREFILLED_SYRINGE | SUBCUTANEOUS | 0 refills | Status: DC

## 2021-07-29 NOTE — Telephone Encounter (Signed)
Refill request received via fax  Next Visit: 08/12/2021  Last Visit: 03/11/2021  Last Fill: 04/16/2021  DX: Rheumatoid arthritis involving multiple sites with positive rheumatoid factor   Current Dose per office note 03/11/2021: Cimzia 200 mg sq injections every 2 weeks  Labs: 03/11/2021 CMP WNL.  Hemoglobin remains slightly low but has improved  TB Gold: 03/11/2021 Neg    Sent patient a my chart message to advise she is due to update labs. Unable to leave patient a voicemail, mailbox is full.   Okay to refill Cimzia?

## 2021-07-29 NOTE — Progress Notes (Signed)
Office Visit Note  Patient: Meghan Arnold             Date of Birth: 1985-11-16           MRN: [id number]             PCP: Patient, No Pcp Per (Inactive) Referring: No ref. provider found Visit Date: 08/12/2021 Occupation: @GUAROCC @  Subjective:  Medication management.   History of Present Illness: Meghan Arnold is a 35 y.o. female with a history of rheumatoid arthritis.  She states she has been doing well on Cimzia.  She denies any joint pain or joint swelling.  She has not had a flare since she started Cimzia in March 2022.  She has been tolerating injections without any side effects.  Activities of Daily Living:  Patient reports morning stiffness for 0 minutes.   Patient Denies nocturnal pain.  Difficulty dressing/grooming: Denies Difficulty climbing stairs: Denies Difficulty getting out of chair: Denies Difficulty using hands for taps, buttons, cutlery, and/or writing: Denies  Review of Systems  Constitutional:  Negative for fatigue, night sweats, weight gain and weight loss.  HENT:  Negative for mouth sores, trouble swallowing, trouble swallowing, mouth dryness and nose dryness.   Eyes:  Negative for pain, redness, itching, visual disturbance and dryness.  Respiratory:  Negative for cough, shortness of breath and difficulty breathing.   Cardiovascular:  Negative for chest pain, palpitations, hypertension, irregular heartbeat and swelling in legs/feet.  Gastrointestinal:  Negative for blood in stool, constipation and diarrhea.  Endocrine: Negative for increased urination.  Genitourinary:  Negative for difficulty urinating and vaginal dryness.  Musculoskeletal:  Negative for joint pain, joint pain, joint swelling, myalgias, muscle weakness, morning stiffness, muscle tenderness and myalgias.  Skin:  Negative for color change, rash, hair loss, redness, skin tightness, ulcers and sensitivity to sunlight.  Allergic/Immunologic: Negative for susceptible to infections.   Neurological:  Negative for dizziness, numbness, headaches, memory loss, night sweats and weakness.  Hematological:  Negative for bruising/bleeding tendency and swollen glands.  Psychiatric/Behavioral:  Negative for depressed mood, confusion and sleep disturbance. The patient is not nervous/anxious.    PMFS History:  Patient Active Problem List   Diagnosis Date Noted   Encounter for induction of labor 11/05/2020   High risk medication use 12/03/2017   Raynaud's disease without gangrene 12/03/2017   Vitamin D deficiency 12/03/2017   Infertility counseling 07/02/2017   Fertility testing 05/04/2017   Seropositive rheumatoid arthritis ([place]) 11/17/2016   Arthralgia of multiple joints 11/17/2016   CRP elevated 11/17/2016   Elevated erythrocyte sedimentation rate 11/17/2016   Raynaud's phenomenon without gangrene 11/17/2016    Past Medical History:  Diagnosis Date   Benign tumor of ovary 2013   Chlamydia    Family history of breast cancer    Raynaud's phenomenon without gangrene    mgmt rheum Dr Annalee Genta   Rheumatoid arthritis ([place]) 2017   Wears contact lenses     Family History  Problem Relation Age of Onset   Colon cancer Maternal Grandfather    Breast cancer Maternal Grandmother 72   Breast cancer Other 48   Breast cancer Cousin 50   Healthy Son    Past Surgical History:  Procedure Laterality Date   LAPAROSCOPIC SALPINGOOPHERECTOMY  2013   done by dr. Kerin Perna , believes right side laterality    LAPAROSCOPIC UNILATERAL SALPINGECTOMY Right 12/01/2017   Procedure: LAPAROSCOPIC LYSIS OF ADHESIONS, RIGHT TUBAL OVARIOLYSIS, RIGHT OVARIAN CYST ASPIRATION,CHROMOPERTUBATION, RIGHT FIMBRIOPLASTY;  Surgeon: Governor Specking, MD;  Location: [facility];  Service: Gynecology;  Laterality: Right;   LAPAROSCOPY     OVARIAN CYST REMOVAL  08/2012   dr Glennon Mac at [facility] ob[facility]yn in [place] ; has abdominal insision    Social History   Social History Narrative   Not  on file   Immunization History  Administered Date(s) Administered   Tdap 08/11/2016     Objective: Vital Signs: BP (!) 143/90 (BP Location: Left Arm, Patient Position: Sitting, Cuff Size: Normal)   Pulse 73   Ht 5\' 5"  (1.651 m)   Wt 148 lb 6.4 oz (67.3 kg)   BMI 24.70 kg/m    Physical Exam Vitals and nursing note reviewed.  Constitutional:      Appearance: She is well-developed.  HENT:     Head: Normocephalic and atraumatic.  Eyes:     Conjunctiva/sclera: Conjunctivae normal.  Cardiovascular:     Rate and Rhythm: Normal rate and regular rhythm.     Heart sounds: Normal heart sounds.  Pulmonary:     Effort: Pulmonary effort is normal.     Breath sounds: Normal breath sounds.  Abdominal:     General: Bowel sounds are normal.     Palpations: Abdomen is soft.  Musculoskeletal:     Cervical back: Normal range of motion.  Lymphadenopathy:     Cervical: No cervical adenopathy.  Skin:    General: Skin is warm and dry.     Capillary Refill: Capillary refill takes less than 2 seconds.  Neurological:     Mental Status: She is alert and oriented to person, place, and time.  Psychiatric:        Behavior: Behavior normal.     Musculoskeletal Exam: C-spine was in good range of motion.  Shoulder joints, elbow joints, wrist joints, MCPs PIPs and DIPs with good range of motion with no synovitis.  Hip joints, knee joints, ankles, MTPs and PIPs with good range of motion with no synovitis.  CDAI Exam: CDAI Score: 0  Patient Global: 0 mm; Provider Global: 0 mm Swollen: 0 ; Tender: 0  Joint Exam 08/12/2021   No joint exam has been documented for this visit   There is currently no information documented on the homunculus. Go to the Rheumatology activity and complete the homunculus joint exam.  Investigation: No additional findings.  Imaging: No results found.  Recent Labs: Lab Results  Component Value Date   WBC 4.1 03/11/2021   HGB 11.3 (L) 03/11/2021   PLT 273 03/11/2021    NA 139 03/11/2021   K 4.2 03/11/2021   CL 104 03/11/2021   CO2 28 03/11/2021   GLUCOSE 70 03/11/2021   BUN 16 03/11/2021   CREATININE 0.72 03/11/2021   BILITOT 0.3 03/11/2021   ALKPHOS 101 11/06/2020   AST 12 03/11/2021   ALT 8 03/11/2021   PROT 7.5 03/11/2021   ALBUMIN 2.7 (L) 11/06/2020   CALCIUM 9.9 03/11/2021   GFRAA 125 03/11/2021   QFTBGOLDPLUS NEGATIVE 03/11/2021    Speciality Comments: Cimzia started December 06, 2020  Procedures:  No procedures performed Allergies: Patient has no known allergies.   Assessment / Plan:     Visit Diagnoses: Rheumatoid arthritis involving multiple sites with positive rheumatoid factor (HCC) - Positive RF and positive anti-CCP , erosive changes in hands and feet -she is clinically doing well with no synovitis on examination.  She is 9 months postpartum now.  She has been tolerating Cimzia well without any side effects.  We will repeat x-rays at  the follow-up visit.  Patient wanted to know for how long she will use Cimzia.  She has aggressive disease.  I explained to her that if she is asymptomatic for 2 years then we can start spacing Cimzia dose.  Plan: Sedimentation rate  High risk medication use - Cimzia 200 mg sq injections every 2 weeks. D/c SSZ-low WBC count. -Labs obtained on March 11, 2021 over within normal limits except for mild anemia.  TB gold was negative on March 11, 2021.  Plan: CBC with Differential/Platelet, COMPLETE METABOLIC PANEL WITH GFR today and then every 3 months.  Information regarding immunization was discussed and  placed in the AVS.  She was advised to hold Cimzia in case she develops an infection and resume after infection resolves.  Annual skin examination to screen for skin cancer was also advised.  Elevated blood pressure reading -blood pressure is elevated today.  She has been advised to monitor blood pressure closely and follow-up with her PCP.  Increased risk of heart disease with rheumatoid arthritis was discussed.   Exercise and dietary modifications were discussed and a handout was placed in the AVS.  Raynaud's disease without gangrene-patient denies typical Raynaud's symptoms.  She states her hands get red during the winter months.  Vitamin D deficiency-she has been taking vitamin D.  Her vitamin D was 35 on November 01, 2020.  We will recheck next year.  Orders: Orders Placed This Encounter  Procedures   CBC with Differential/Platelet   COMPLETE METABOLIC PANEL WITH GFR   Sedimentation rate   No orders of the defined types were placed in this encounter.    Follow-Up Instructions: Return in about 5 months (around 01/10/2022) for Rheumatoid arthritis.   Bo Merino, MD  Note - This record has been created using Editor, commissioning.  Chart creation errors have been sought, but may not always  have been located. Such creation errors do not reflect on  the standard of medical care.

## 2021-08-12 ENCOUNTER — Other Ambulatory Visit: Payer: Self-pay

## 2021-08-12 ENCOUNTER — Encounter: Payer: Self-pay | Admitting: Rheumatology

## 2021-08-12 ENCOUNTER — Ambulatory Visit (INDEPENDENT_AMBULATORY_CARE_PROVIDER_SITE_OTHER): Payer: BC Managed Care – PPO | Admitting: Rheumatology

## 2021-08-12 VITALS — BP 143/90 | HR 73 | Ht 65.0 in | Wt 148.4 lb

## 2021-08-12 DIAGNOSIS — M0579 Rheumatoid arthritis with rheumatoid factor of multiple sites without organ or systems involvement: Secondary | ICD-10-CM | POA: Diagnosis not present

## 2021-08-12 DIAGNOSIS — I7300 Raynaud's syndrome without gangrene: Secondary | ICD-10-CM

## 2021-08-12 DIAGNOSIS — Z79899 Other long term (current) drug therapy: Secondary | ICD-10-CM

## 2021-08-12 DIAGNOSIS — R030 Elevated blood-pressure reading, without diagnosis of hypertension: Secondary | ICD-10-CM

## 2021-08-12 DIAGNOSIS — G8929 Other chronic pain: Secondary | ICD-10-CM

## 2021-08-12 DIAGNOSIS — E559 Vitamin D deficiency, unspecified: Secondary | ICD-10-CM | POA: Diagnosis not present

## 2021-08-12 LAB — CBC WITH DIFFERENTIAL/PLATELET
Absolute Monocytes: 339 cells/uL (ref 200–950)
Basophils Absolute: 70 cells/uL (ref 0–200)
Basophils Relative: 1.8 %
Eosinophils Absolute: 491 cells/uL (ref 15–500)
Eosinophils Relative: 12.6 %
HCT: 38.1 % (ref 35.0–45.0)
Hemoglobin: 12.6 g/dL (ref 11.7–15.5)
Lymphs Abs: 2020 cells/uL (ref 850–3900)
MCH: 28.8 pg (ref 27.0–33.0)
MCHC: 33.1 g/dL (ref 32.0–36.0)
MCV: 87.2 fL (ref 80.0–100.0)
MPV: 9.7 fL (ref 7.5–12.5)
Monocytes Relative: 8.7 %
Neutro Abs: 979 cells/uL — ABNORMAL LOW (ref 1500–7800)
Neutrophils Relative %: 25.1 %
Platelets: 290 10*3/uL (ref 140–400)
RBC: 4.37 10*6/uL (ref 3.80–5.10)
RDW: 13.2 % (ref 11.0–15.0)
Total Lymphocyte: 51.8 %
WBC: 3.9 10*3/uL (ref 3.8–10.8)

## 2021-08-12 LAB — COMPLETE METABOLIC PANEL WITH GFR
AG Ratio: 1.4 (calc) (ref 1.0–2.5)
ALT: 7 U/L (ref 6–29)
AST: 15 U/L (ref 10–30)
Albumin: 4.6 g/dL (ref 3.6–5.1)
Alkaline phosphatase (APISO): 70 U/L (ref 31–125)
BUN: 12 mg/dL (ref 7–25)
CO2: 28 mmol/L (ref 20–32)
Calcium: 9.7 mg/dL (ref 8.6–10.2)
Chloride: 102 mmol/L (ref 98–110)
Creat: 0.7 mg/dL (ref 0.50–0.97)
Globulin: 3.4 g/dL (calc) (ref 1.9–3.7)
Glucose, Bld: 75 mg/dL (ref 65–99)
Potassium: 4.2 mmol/L (ref 3.5–5.3)
Sodium: 138 mmol/L (ref 135–146)
Total Bilirubin: 0.4 mg/dL (ref 0.2–1.2)
Total Protein: 8 g/dL (ref 6.1–8.1)
eGFR: 115 mL/min/{1.73_m2} (ref >=60)

## 2021-08-12 LAB — SEDIMENTATION RATE: Sed Rate: 31 mm/h — ABNORMAL HIGH (ref 0–20)

## 2021-08-12 NOTE — Patient Instructions (Signed)
Standing Labs We placed an order today for your standing lab work.   Please have your standing labs drawn in February and every 3 months  If possible, please have your labs drawn 2 weeks prior to your appointment so that the provider can discuss your results at your appointment.  Please note that you may see your imaging and lab results in [place] before we have reviewed them. We may be awaiting multiple results to interpret others before contacting you. Please allow our office up to 72 hours to thoroughly review all of the results before contacting the office for clarification of your results.  We have open lab daily: Monday through Thursday from 1:30-4:30 PM and Friday from 1:30-4:00 PM at the office of Dr. Bo Merino, [facility].   Please be advised, all patients with office appointments requiring lab work will take precedent over walk-in lab work.  If possible, please come for your lab work on Monday and Friday afternoons, as you may experience shorter wait times. The office is located at [address] [place], [place], [place] [address] No appointment is necessary.   Labs are drawn by [facility]. Please bring your co-pay at the time of your lab draw.  You may receive a bill from [facility] for your lab work.  If you wish to have your labs drawn at another location, please call the office 24 hours in advance to send orders.  If you have any questions regarding directions or hours of operation,  please call [phone].   As a reminder, please drink plenty of water prior to coming for your lab work. Thanks!   Vaccines You are taking a medication(s) that can suppress your immune system.  The following immunizations are recommended: Flu annually Covid-19  Td/Tdap (tetanus, diphtheria, pertussis) every 10 years Pneumonia (Prevnar 15 then Pneumovax 23 at least 1 year apart.  Alternatively, can take Prevnar 20 without needing additional dose) Shingrix: 2 doses from 4  weeks to 6 months apart  Please check with your PCP to make sure you are up to date.    If you have signs or symptoms of an infection or start antibiotics: First, call your PCP for workup of your infection. Hold your medication through the infection, until you complete your antibiotics, and until symptoms resolve if you take the following: Injectable medication (Actemra, Benlysta, Cimzia, Cosentyx, Enbrel, Humira, Kevzara, Orencia, Remicade, Simponi, Stelara, Taltz, Tremfya) Methotrexate Leflunomide (Arava)  Mycophenolate (Cellcept) Roma Kayser, or Rinvoq  Please get annual skin examination by dermatologist to screen for skin cancer.  Heart Disease Prevention   Your inflammatory disease increases your risk of heart disease which includes heart attack, stroke, atrial fibrillation (irregular heartbeats), high blood pressure, heart failure and atherosclerosis (plaque in the arteries).  It is important to reduce your risk by:   Keep blood pressure, cholesterol, and blood sugar at healthy levels   Smoking Cessation   Maintain a healthy weight  BMI 20-25   Eat a healthy diet  Plenty of fresh fruit, vegetables, and whole grains  Limit saturated fats, foods high in sodium, and added sugars  DASH and Mediterranean diet   Increase physical activity  Recommend moderate physically activity for 150 minutes per week/ 30 minutes a day for five days a week These can be broken up into three separate ten-minute sessions during the day.   Reduce Stress  Meditation, slow breathing exercises, yoga, coloring books  Dental visits twice a year

## 2021-08-13 NOTE — Progress Notes (Signed)
CBC and CMP normal.  Sed rate is mildly elevated and stable.

## 2021-09-23 ENCOUNTER — Other Ambulatory Visit: Payer: Self-pay | Admitting: *Deleted

## 2021-09-23 MED ORDER — CERTOLIZUMAB PEGOL 2 X 200 MG/ML ~~LOC~~ PSKT: 200.0000 mg | PREFILLED_SYRINGE | SUBCUTANEOUS | 2 refills | Status: DC

## 2021-09-23 NOTE — Telephone Encounter (Signed)
Next Visit: 01/06/2022  Last Visit: 08/12/2021  Last Fill: 04/16/2021  DX: Rheumatoid arthritis involving multiple sites with positive rheumatoid factor   Current Dose per office note 08/12/2021: Cimzia 200 mg sq injections every 2 weeks.   Labs: 08/12/2021, CBC and CMP normal.  Sed rate is mildly elevated and stable.  TB Gold: 03/11/2021, negative   Okay to refill Cimzia?

## 2021-10-13 ENCOUNTER — Other Ambulatory Visit (HOSPITAL_COMMUNITY): Payer: Self-pay

## 2021-11-05 ENCOUNTER — Other Ambulatory Visit (HOSPITAL_COMMUNITY): Payer: Self-pay

## 2021-12-17 ENCOUNTER — Telehealth: Payer: Self-pay

## 2021-12-17 NOTE — Telephone Encounter (Signed)
Received notification from  [facility]  regarding a prior authorization for [facility]. Authorization has been APPROVED from 12/17/2021 to 12/17/2022.   Updated [employer] with current Prior Authorization information. ? ?Authorization # [id number] ?

## 2021-12-17 NOTE — Telephone Encounter (Signed)
Per Rinaldo Ratel, current Prior Authorization is expiring. ? ?Submitted a Prior Authorization request to  [employer]  for CIMZIA via CoverMyMeds. Will update once we receive a response. ? ? ?Key: [id number] ?

## 2021-12-23 NOTE — Progress Notes (Signed)
? ?Office Visit Note ? ?Patient: Meghan Arnold             ?Date of Birth: Oct 22, 1985           ?MRN: [id number]             ?PCP: Patient, No Pcp Per (Inactive) ?Referring: No ref. provider found ?Visit Date: 01/06/2022 ?Occupation: '@GUAROCC'$ @ ? ?Subjective:  ?Medication monitoring  ? ?History of Present Illness: Meghan Arnold is a 36 y.o. female with history of seropositive rheumatoid arthritis.  She remains on Cimzia 200 mg sq injections every 2 weeks.  She denies any signs or symptoms of a rheumatoid arthritis flare since prior to her last office visit.  She has not experienced any joint pain, joint swelling, or morning stiffness at this time.  She has not had any nocturnal pain.  She has not had any difficulty performing ADLs.  Her energy level has been stable.  She denies any new medical conditions.  She has not had any symptoms of Raynaud's.  She denies any recent infections. ? ? ? ?Activities of Daily Living:  ?Patient reports morning stiffness for 0 minutes.   ?Patient Denies nocturnal pain.  ?Difficulty dressing/grooming: Denies ?Difficulty climbing stairs: Denies ?Difficulty getting out of chair: Denies ?Difficulty using hands for taps, buttons, cutlery, and/or writing: Denies ? ?Review of Systems  ?Constitutional:  Negative for fatigue.  ?HENT:  Negative for mouth sores, mouth dryness and nose dryness.   ?Eyes:  Negative for pain, itching and dryness.  ?Respiratory:  Negative for shortness of breath and difficulty breathing.   ?Cardiovascular:  Negative for chest pain and palpitations.  ?Gastrointestinal:  Negative for blood in stool, constipation and diarrhea.  ?Endocrine: Negative for increased urination.  ?Genitourinary:  Negative for difficulty urinating.  ?Musculoskeletal:  Negative for joint pain, joint pain, joint swelling, myalgias, morning stiffness, muscle tenderness and myalgias.  ?Skin:  Negative for color change, rash and redness.  ?Allergic/Immunologic: Negative for susceptible to  infections.  ?Neurological:  Negative for dizziness, numbness, headaches, memory loss and weakness.  ?Hematological:  Negative for bruising/bleeding tendency.  ?Psychiatric/Behavioral:  Negative for confusion.   ? ?PMFS History:  ?Patient Active Problem List  ? Diagnosis Date Noted  ? Encounter for induction of labor 11/05/2020  ? High risk medication use 12/03/2017  ? Raynaud's disease without gangrene 12/03/2017  ? Vitamin D deficiency 12/03/2017  ? Infertility counseling 07/02/2017  ? Fertility testing 05/04/2017  ? Seropositive rheumatoid arthritis ([place]) 11/17/2016  ? Arthralgia of multiple joints 11/17/2016  ? CRP elevated 11/17/2016  ? Elevated erythrocyte sedimentation rate 11/17/2016  ? Raynaud's phenomenon without gangrene 11/17/2016  ?  ?Past Medical History:  ?Diagnosis Date  ? Benign tumor of ovary 2013  ? Chlamydia   ? Family history of breast cancer   ? Raynaud's phenomenon without gangrene   ? mgmt rheum Dr Annalee Genta  ? Rheumatoid arthritis ([place]) 2017  ? Wears contact lenses   ?  ?Family History  ?Problem Relation Age of Onset  ? Colon cancer Maternal Grandfather   ? Breast cancer Maternal Grandmother 78  ? Breast cancer Other 32  ? Breast cancer Cousin 81  ? Healthy Son   ? ?Past Surgical History:  ?Procedure Laterality Date  ? LAPAROSCOPIC SALPINGOOPHERECTOMY  2013  ? done by dr. Kerin Perna , believes right side laterality   ? LAPAROSCOPIC UNILATERAL SALPINGECTOMY Right 12/01/2017  ? Procedure: LAPAROSCOPIC LYSIS OF ADHESIONS, RIGHT TUBAL OVARIOLYSIS, RIGHT OVARIAN CYST ASPIRATION,CHROMOPERTUBATION, RIGHT FIMBRIOPLASTY;  Surgeon: Kerin Perna,  Temple Pacini, MD;  Location: [place];  Service: Gynecology;  Laterality: Right;  ? LAPAROSCOPY    ? OVARIAN CYST REMOVAL  08/2012  ? dr Glennon Mac at [facility] in [place] ; has abdominal insision   ? ?Social History  ? ?Social History Narrative  ? Not on file  ? ?Immunization History  ?Administered Date(s) Administered  ? Tdap 08/11/2016  ?   ? ?Objective: ?Vital Signs: BP 128/86 (BP Location: Left Arm, Patient Position: Sitting, Cuff Size: Normal)   Pulse 77   Ht '5\' 5"'$  (1.651 m)   Wt 148 lb 12.8 oz (67.5 kg)   BMI 24.76 kg/m?   ? ?Physical Exam ?Vitals and nursing note reviewed.  ?Constitutional:   ?   Appearance: She is well-developed.  ?HENT:  ?   Head: Normocephalic and atraumatic.  ?Eyes:  ?   Conjunctiva/sclera: Conjunctivae normal.  ?Cardiovascular:  ?   Rate and Rhythm: Normal rate and regular rhythm.  ?   Heart sounds: Normal heart sounds.  ?Pulmonary:  ?   Effort: Pulmonary effort is normal.  ?   Breath sounds: Normal breath sounds.  ?Abdominal:  ?   General: Bowel sounds are normal.  ?   Palpations: Abdomen is soft.  ?Musculoskeletal:  ?   Cervical back: Normal range of motion.  ?Skin: ?   General: Skin is warm and dry.  ?   Capillary Refill: Capillary refill takes less than 2 seconds.  ?Neurological:  ?   Mental Status: She is alert and oriented to person, place, and time.  ?Psychiatric:     ?   Behavior: Behavior normal.  ?  ? ?Musculoskeletal Exam: C-spine, thoracic spine, lumbar spine have good range of motion.  No midline spinal tenderness or SI joint tenderness.  Shoulder joints, elbow joints, wrist joints, MCPs, PIPs, DIPs have good range of motion with no synovitis.  Complete fist formation bilaterally.  Hip joints have good range of motion with no groin pain.  Knee joints have good range of motion with no warmth or effusion.  Ankle joints have good range of motion with no tenderness or joint swelling. ? ?CDAI Exam: ?CDAI Score: 0  ?Patient Global: 0 mm; Provider Global: 0 mm ?Swollen: 0 ; Tender: 0  ?Joint Exam 01/06/2022  ? ?No joint exam has been documented for this visit  ? ?There is currently no information documented on the homunculus. Go to the Rheumatology activity and complete the homunculus joint exam. ? ?Investigation: ?No additional findings. ? ?Imaging: ?No results found. ? ?Recent Labs: ?Lab Results  ?Component Value  Date  ? WBC 3.9 08/12/2021  ? HGB 12.6 08/12/2021  ? PLT 290 08/12/2021  ? NA 138 08/12/2021  ? K 4.2 08/12/2021  ? CL 102 08/12/2021  ? CO2 28 08/12/2021  ? GLUCOSE 75 08/12/2021  ? BUN 12 08/12/2021  ? CREATININE 0.70 08/12/2021  ? BILITOT 0.4 08/12/2021  ? ALKPHOS 101 11/06/2020  ? AST 15 08/12/2021  ? ALT 7 08/12/2021  ? PROT 8.0 08/12/2021  ? ALBUMIN 2.7 (L) 11/06/2020  ? CALCIUM 9.7 08/12/2021  ? GFRAA 125 03/11/2021  ? QFTBGOLDPLUS NEGATIVE 03/11/2021  ? ? ?Speciality Comments: Cimzia started December 06, 2020 ? ?Procedures:  ?No procedures performed ?Allergies: Patient has no known allergies.  ? ?Assessment / Plan:     ?Visit Diagnoses: Rheumatoid arthritis involving multiple sites with positive rheumatoid factor (HCC) - Positive RF and positive anti-CCP, erosive changes in hands and feet: She has no joint tenderness or  synovitis on examination today.  She has not had any signs or symptoms of a rheumatoid arthritis flare.  She has clinically been doing well on Cimzia 200 mg subcutaneous injections every 2 weeks.  She has been tolerating Cimzia without any side effects, injection site reactions, recent infections.  She has not been experiencing any difficulty with ADLs, morning stiffness, nocturnal pain.  Discussed the importance of remaining active, stress management, and good sleep hygiene.  She will remain on Cimzia as prescribed.  A sample was provided today in the office.  Updated lab work was also obtained to monitor for drug toxicity.  She was advised to notify us if she develops increased joint pain or joint swelling.  She will follow-up in the office in 5 months or sooner if needed. ? ?High risk medication use - Cimzia 200 mg sq injections every 2 weeks. D/c SSZ-low WBC count. ?CBC and CMP updated on 08/12/21. Order for CBC and CMP released today.  Her next lab work will be due in July and every 3 months to monitor for drug toxicity.  - Plan: CBC with Differential/Platelet, COMPLETE METABOLIC PANEL WITH  GFR, QuantiFERON-TB Gold Plus ?TB gold negative on 03/11/21.  Future order for TB gold placed today.  ?She has not had any signs or symptoms of an infection.  Discussed the importance of holding Cimzia if she de

## 2022-01-06 ENCOUNTER — Encounter: Payer: Self-pay | Admitting: Physician Assistant

## 2022-01-06 ENCOUNTER — Other Ambulatory Visit: Payer: Self-pay | Admitting: Pharmacist

## 2022-01-06 ENCOUNTER — Ambulatory Visit: Payer: Medicaid Other | Admitting: Physician Assistant

## 2022-01-06 ENCOUNTER — Other Ambulatory Visit (HOSPITAL_COMMUNITY): Payer: Self-pay

## 2022-01-06 VITALS — BP 128/86 | HR 77 | Ht 65.0 in | Wt 148.8 lb

## 2022-01-06 DIAGNOSIS — I7300 Raynaud's syndrome without gangrene: Secondary | ICD-10-CM

## 2022-01-06 DIAGNOSIS — M0579 Rheumatoid arthritis with rheumatoid factor of multiple sites without organ or systems involvement: Secondary | ICD-10-CM | POA: Diagnosis not present

## 2022-01-06 DIAGNOSIS — Z79899 Other long term (current) drug therapy: Secondary | ICD-10-CM

## 2022-01-06 DIAGNOSIS — Z111 Encounter for screening for respiratory tuberculosis: Secondary | ICD-10-CM

## 2022-01-06 DIAGNOSIS — E559 Vitamin D deficiency, unspecified: Secondary | ICD-10-CM

## 2022-01-06 NOTE — Patient Instructions (Signed)
Standing Labs ?We placed an order today for your standing lab work.  ? ?Please have your standing labs drawn in July and every 3 months  ? ?If possible, please have your labs drawn 2 weeks prior to your appointment so that the provider can discuss your results at your appointment. ? ?Please note that you may see your imaging and lab results in MyChart before we have reviewed them. ?We may be awaiting multiple results to interpret others before contacting you. ?Please allow our office up to 72 hours to thoroughly review all of the results before contacting the office for clarification of your results. ? ?We have open lab daily: ?Monday through Thursday from 1:30-4:30 PM and Friday from 1:30-4:00 PM ?at the office of Dr. Shaili Deveshwar, [facility].   ?Please be advised, all patients with office appointments requiring lab work will take precedent over walk-in lab work.  ?If possible, please come for your lab work on Monday and Friday afternoons, as you may experience shorter wait times. ?The office is located at [address], [address], [place], [place] [address] ?No appointment is necessary.   ?Labs are drawn by [facility]. Please bring your co-pay at the time of your lab draw.  You may receive a bill from [facility] for your lab work. ? ?Please note if you are on Hydroxychloroquine and and an order has been placed for a Hydroxychloroquine level, you will need to have it drawn 4 hours or more after your last dose. ? ?If you wish to have your labs drawn at another location, please call the office 24 hours in advance to send orders. ? ?If you have any questions regarding directions or hours of operation,  ?please call [phone].   ?As a reminder, please drink plenty of water prior to coming for your lab work. Thanks! ? ?

## 2022-01-06 NOTE — Telephone Encounter (Signed)
Pending labs drawn today at [facility] with Hazel Sams, PA-C, please send Cimzia refill to [facility] for patient. ? ?Knox Saliva, PharmD, MPH, BCPS ?Clinical Pharmacist (Rheumatology and Pulmonology) ?

## 2022-01-07 LAB — COMPLETE METABOLIC PANEL WITH GFR
AG Ratio: 1.4 (calc) (ref 1.0–2.5)
ALT: 7 U/L (ref 6–29)
AST: 12 U/L (ref 10–30)
Albumin: 4.4 g/dL (ref 3.6–5.1)
Alkaline phosphatase (APISO): 55 U/L (ref 31–125)
BUN: 14 mg/dL (ref 7–25)
CO2: 25 mmol/L (ref 20–32)
Calcium: 9.6 mg/dL (ref 8.6–10.2)
Chloride: 104 mmol/L (ref 98–110)
Creat: 0.74 mg/dL (ref 0.50–0.97)
Globulin: 3.1 g/dL (calc) (ref 1.9–3.7)
Glucose, Bld: 79 mg/dL (ref 65–99)
Potassium: 4.3 mmol/L (ref 3.5–5.3)
Sodium: 138 mmol/L (ref 135–146)
Total Bilirubin: 0.4 mg/dL (ref 0.2–1.2)
Total Protein: 7.5 g/dL (ref 6.1–8.1)
eGFR: 107 mL/min/{1.73_m2} (ref >=60)

## 2022-01-07 LAB — CBC WITH DIFFERENTIAL/PLATELET
Absolute Monocytes: 538 cells/uL (ref 200–950)
Basophils Absolute: 59 cells/uL (ref 0–200)
Basophils Relative: 1.5 %
Eosinophils Absolute: 398 cells/uL (ref 15–500)
Eosinophils Relative: 10.2 %
HCT: 38.5 % (ref 35.0–45.0)
Hemoglobin: 12.6 g/dL (ref 11.7–15.5)
Lymphs Abs: 1841 cells/uL (ref 850–3900)
MCH: 29.4 pg (ref 27.0–33.0)
MCHC: 32.7 g/dL (ref 32.0–36.0)
MCV: 90 fL (ref 80.0–100.0)
MPV: 9.6 fL (ref 7.5–12.5)
Monocytes Relative: 13.8 %
Neutro Abs: 1065 cells/uL — ABNORMAL LOW (ref 1500–7800)
Neutrophils Relative %: 27.3 %
Platelets: 252 10*3/uL (ref 140–400)
RBC: 4.28 10*6/uL (ref 3.80–5.10)
RDW: 12.6 % (ref 11.0–15.0)
Total Lymphocyte: 47.2 %
WBC: 3.9 10*3/uL (ref 3.8–10.8)

## 2022-01-07 MED ORDER — CERTOLIZUMAB PEGOL 2 X 200 MG/ML ~~LOC~~ PSKT: 200.0000 mg | PREFILLED_SYRINGE | SUBCUTANEOUS | 2 refills | Status: DC

## 2022-01-07 NOTE — Progress Notes (Signed)
CMP WNL.  Absolute neutrophil count is borderline low but has improved. Rest of CBC WNL.

## 2022-01-07 NOTE — Telephone Encounter (Signed)
Labs: CMP WNL.  Absolute neutrophil count is borderline low but has improved. Rest of CBC WNL.  ?

## 2022-05-27 NOTE — Progress Notes (Signed)
Office Visit Note  Patient: Meghan Arnold             Date of Birth: December 30, 1985           MRN: [id number]             PCP: Patient, No Pcp Per Referring: No ref. provider found Visit Date: 06/09/2022 Occupation: '@GUAROCC'$ @  Subjective:  Medication management  History of Present Illness: Meghan Arnold is a 36 y.o. female with history of seropositive rheumatoid arthritis.  She states she has been taking Cimzia injections on a regular basis without any side effects.  She denies any increased joint pain or joint swelling.  She states Cimzia has been working well for her.  She states she has been getting intermittent rash on her legs for which she has been using topical clobetasol.  Raynauds is currently not active.  Activities of Daily Living:  Patient reports morning stiffness for 0 minutes.   Patient Denies nocturnal pain.  Difficulty dressing/grooming: Denies Difficulty climbing stairs: Denies Difficulty getting out of chair: Denies Difficulty using hands for taps, buttons, cutlery, and/or writing: Denies  Review of Systems  Constitutional:  Negative for fatigue.  HENT:  Negative for mouth sores and mouth dryness.   Eyes:  Negative for dryness.  Respiratory:  Negative for shortness of breath.   Cardiovascular:  Negative for chest pain and palpitations.  Gastrointestinal:  Negative for blood in stool, constipation and diarrhea.  Endocrine: Negative for increased urination.  Genitourinary:  Negative for involuntary urination.  Musculoskeletal:  Negative for joint pain, gait problem, joint pain, joint swelling, myalgias, muscle weakness, morning stiffness, muscle tenderness and myalgias.  Skin:  Positive for rash. Negative for color change, hair loss and sensitivity to sunlight.  Allergic/Immunologic: Negative for susceptible to infections.  Neurological:  Negative for dizziness and headaches.  Hematological:  Negative for swollen glands.  Psychiatric/Behavioral:  Negative for  depressed mood and sleep disturbance. The patient is not nervous/anxious.     PMFS History:  Patient Active Problem List   Diagnosis Date Noted   Encounter for induction of labor 11/05/2020   High risk medication use 12/03/2017   Raynaud's disease without gangrene 12/03/2017   Vitamin D deficiency 12/03/2017   Infertility counseling 07/02/2017   Fertility testing 05/04/2017   Seropositive rheumatoid arthritis ([place]) 11/17/2016   Arthralgia of multiple joints 11/17/2016   CRP elevated 11/17/2016   Elevated erythrocyte sedimentation rate 11/17/2016   Raynaud's phenomenon without gangrene 11/17/2016    Past Medical History:  Diagnosis Date   Benign tumor of ovary 2013   Chlamydia    Family history of breast cancer    Raynaud's phenomenon without gangrene    mgmt rheum Dr Annalee Genta   Rheumatoid arthritis ([place]) 2017   Wears contact lenses     Family History  Problem Relation Age of Onset   Colon cancer Maternal Grandfather    Breast cancer Maternal Grandmother 72   Breast cancer Other 46   Breast cancer Cousin 11   Healthy Son    Past Surgical History:  Procedure Laterality Date   LAPAROSCOPIC SALPINGOOPHERECTOMY  2013   done by dr. Kerin Perna , believes right side laterality    LAPAROSCOPIC UNILATERAL SALPINGECTOMY Right 12/01/2017   Procedure: LAPAROSCOPIC LYSIS OF ADHESIONS, RIGHT TUBAL OVARIOLYSIS, RIGHT OVARIAN CYST ASPIRATION,CHROMOPERTUBATION, RIGHT FIMBRIOPLASTY;  Surgeon: Governor Specking, MD;  Location: [facility];  Service: Gynecology;  Laterality: Right;   LAPAROSCOPY     OVARIAN CYST REMOVAL  08/2012  dr Glennon Mac at [facility] ob[facility] in [place] ; has abdominal insision    Social History   Social History Narrative   Not on file   Immunization History  Administered Date(s) Administered   Tdap 08/11/2016     Objective: Vital Signs: BP 117/78 (BP Location: Left Arm, Patient Position: Sitting, Cuff Size: Normal)   Pulse 86   Ht '5\' 5"'$   (1.651 m)   Wt 155 lb 6.4 oz (70.5 kg)   BMI 25.86 kg/m    Physical Exam Vitals and nursing note reviewed.  Constitutional:      Appearance: She is well-developed.  HENT:     Head: Normocephalic and atraumatic.  Eyes:     Conjunctiva/sclera: Conjunctivae normal.  Cardiovascular:     Rate and Rhythm: Normal rate and regular rhythm.     Heart sounds: Normal heart sounds.  Pulmonary:     Effort: Pulmonary effort is normal.     Breath sounds: Normal breath sounds.  Abdominal:     General: Bowel sounds are normal.     Palpations: Abdomen is soft.  Musculoskeletal:     Cervical back: Normal range of motion.  Lymphadenopathy:     Cervical: No cervical adenopathy.  Skin:    General: Skin is warm and dry.     Capillary Refill: Capillary refill takes less than 2 seconds.  Neurological:     Mental Status: She is alert and oriented to person, place, and time.  Psychiatric:        Behavior: Behavior normal.      Musculoskeletal Exam: Cervical spine was in good range of motion.  She had good mobility in thoracic and lumbar spine without any discomfort.  She was able to reach her toes without any difficulty.  Shoulder joints, elbow joints, wrist joints, MCPs PIPs and DIPs with good range of motion with no synovitis.  Hip joints, knee joints, ankles, MTPs and PIPs been good range of motion with no synovitis.  CDAI Exam: CDAI Score: 0  Patient Global: 0 mm; Provider Global: 0 mm Swollen: 0 ; Tender: 0  Joint Exam 06/09/2022   No joint exam has been documented for this visit   There is currently no information documented on the homunculus. Go to the Rheumatology activity and complete the homunculus joint exam.  Investigation: No additional findings.  Imaging: No results found.  Recent Labs: Lab Results  Component Value Date   WBC 3.9 01/06/2022   HGB 12.6 01/06/2022   PLT 252 01/06/2022   NA 138 01/06/2022   K 4.3 01/06/2022   CL 104 01/06/2022   CO2 25 01/06/2022    GLUCOSE 79 01/06/2022   BUN 14 01/06/2022   CREATININE 0.74 01/06/2022   BILITOT 0.4 01/06/2022   ALKPHOS 101 11/06/2020   AST 12 01/06/2022   ALT 7 01/06/2022   PROT 7.5 01/06/2022   ALBUMIN 2.7 (L) 11/06/2020   CALCIUM 9.6 01/06/2022   GFRAA 125 03/11/2021   QFTBGOLDPLUS NEGATIVE 03/11/2021    Speciality Comments: Cimzia 06/19- restarted December 06, 2020  Procedures:  No procedures performed Allergies: Patient has no known allergies.   Assessment / Plan:     Visit Diagnoses: Rheumatoid arthritis involving multiple sites with positive rheumatoid factor (HCC) - Positive RF and positive anti-CCP, erosive changes in hands and feet: She had no synovitis on my examination.  She has been tolerating Cimzia without any side effects.  She is currently on Cimzia 200 mg subcu every 2 weeks.  She denies any morning stiffness.  High risk medication use - Cimzia 200 mg sq injections every 2 weeks. D/c SSZ-low WBC count. -Last labs were in April 2023.  TB gold was negative in June 2022.  Need for getting labs every 3 months was discussed.  We will get labs today and then every 3 months.  Plan: CBC with Differential/Platelet, COMPLETE METABOLIC PANEL WITH GFR, QuantiFERON-TB Gold Plus.  Information about immunization was placed in the AVS.  She was also advised to stop Cimzia Epesri develops an infection and resume after the infection resolves.  Raynaud's disease without gangrene-current not active.  Vitamin D deficiency -vitamin D has been low in the past.  She has not been taking vitamin D.  Plan: VITAMIN D 25 Hydroxy (Vit-D Deficiency, Fractures)  Rash-she gives history of intermittent rash on her lower extremities for which she has been using clobetasol.  No rash was of present today.  I advised her to see the dermatologist if she has recurrence of rash.  Orders: Orders Placed This Encounter  Procedures   CBC with Differential/Platelet   COMPLETE METABOLIC PANEL WITH GFR   QuantiFERON-TB Gold  Plus   VITAMIN D 25 Hydroxy (Vit-D Deficiency, Fractures)   No orders of the defined types were placed in this encounter.    Follow-Up Instructions: Return in about 5 months (around 11/09/2022) for Rheumatoid arthritis.   Bo Merino, MD  Note - This record has been created using Editor, commissioning.  Chart creation errors have been sought, but may not always  have been located. Such creation errors do not reflect on  the standard of medical care.

## 2022-06-09 ENCOUNTER — Encounter: Payer: Self-pay | Admitting: Rheumatology

## 2022-06-09 ENCOUNTER — Ambulatory Visit: Payer: Medicaid Other | Attending: Rheumatology | Admitting: Rheumatology

## 2022-06-09 VITALS — BP 117/78 | HR 86 | Ht 65.0 in | Wt 155.4 lb

## 2022-06-09 DIAGNOSIS — E559 Vitamin D deficiency, unspecified: Secondary | ICD-10-CM | POA: Diagnosis not present

## 2022-06-09 DIAGNOSIS — Z79899 Other long term (current) drug therapy: Secondary | ICD-10-CM

## 2022-06-09 DIAGNOSIS — M0579 Rheumatoid arthritis with rheumatoid factor of multiple sites without organ or systems involvement: Secondary | ICD-10-CM | POA: Diagnosis not present

## 2022-06-09 DIAGNOSIS — I7300 Raynaud's syndrome without gangrene: Secondary | ICD-10-CM

## 2022-06-09 DIAGNOSIS — R21 Rash and other nonspecific skin eruption: Secondary | ICD-10-CM

## 2022-06-09 NOTE — Patient Instructions (Signed)
Standing Labs We placed an order today for your standing lab work.   Please have your standing labs drawn in November and every 3 months  If possible, please have your labs drawn 2 weeks prior to your appointment so that the provider can discuss your results at your appointment.  Please note that you may see your imaging and lab results in [facility] before we have reviewed them. We may be awaiting multiple results to interpret others before contacting you. Please allow our office up to 72 hours to thoroughly review all of the results before contacting the office for clarification of your results.  We currently have open lab daily: Monday through Thursday from 1:30 PM-4:30 PM and Friday from 1:30 PM- 4:00 PM If possible, please come for your lab work on Monday, Thursday or Friday afternoons, as you may experience shorter wait times.   Effective July 29, 2022 the new lab hours will change to: Monday through Thursday from 1:30 PM-5:00 PM and Friday from 8:30 AM-12:00 PM If possible, please come for your lab work on Monday and Thursday afternoons, as you may experience shorter wait times.  Please be advised, all patients with office appointments requiring lab work will take precedent over walk-in lab work.    The office is located at [address] [place], [place], [place] [address] No appointment is necessary.   Labs are drawn by [facility]. Please bring your co-pay at the time of your lab draw.  You may receive a bill from [facility] for your lab work.  Please note if you are on Hydroxychloroquine and and an order has been placed for a Hydroxychloroquine level, you will need to have it drawn 4 hours or more after your last dose.  If you wish to have your labs drawn at another location, please call the office 24 hours in advance to send orders.  If you have any questions regarding directions or hours of operation,  please call [phone].   As a reminder, please drink plenty of water prior  to coming for your lab work. Thanks!   Vaccines You are taking a medication(s) that can suppress your immune system.  The following immunizations are recommended: Flu annually Covid-19  Td/Tdap (tetanus, diphtheria, pertussis) every 10 years Pneumonia (Prevnar 15 then Pneumovax 23 at least 1 year apart.  Alternatively, can take Prevnar 20 without needing additional dose) Shingrix: 2 doses from 4 weeks to 6 months apart  Please check with your PCP to make sure you are up to date.   If you have signs or symptoms of an infection or start antibiotics: First, call your PCP for workup of your infection. Hold your medication through the infection, until you complete your antibiotics, and until symptoms resolve if you take the following: Injectable medication (Actemra, Benlysta, Cimzia, Cosentyx, Enbrel, Humira, Kevzara, Orencia, Remicade, Simponi, Stelara, Taltz, Tremfya) Methotrexate Leflunomide (Arava) Mycophenolate (Cellcept) Morrie Sheldon, Olumiant, or Rinvoq   Please get an annual skin examination to screen for skin cancer while you are on Cimzia.

## 2022-06-10 ENCOUNTER — Other Ambulatory Visit: Payer: Self-pay | Admitting: *Deleted

## 2022-06-10 DIAGNOSIS — E559 Vitamin D deficiency, unspecified: Secondary | ICD-10-CM

## 2022-06-10 MED ORDER — VITAMIN D3 1.25 MG (50000 UT) PO TABS: 50000.0000 [IU] | ORAL_TABLET | ORAL | 0 refills | Status: DC

## 2022-06-10 NOTE — Telephone Encounter (Signed)
-----   Message from Bo Merino, MD sent at 06/10/2022  8:23 AM EDT ----- CBC and CMP is stable.  Vitamin D is low at 24.  Please call in vitamin D 50,000 units once a week for 3 months.  Patient should stay on vitamin D 2000 units daily after finishing the course of vitamin D.  Repeat vitamin D in 3 months.

## 2022-06-10 NOTE — Progress Notes (Signed)
CBC and CMP is stable.  Vitamin D is low at 24.  Please call in vitamin D 50,000 units once a week for 3 months.  Patient should stay on vitamin D 2000 units daily after finishing the course of vitamin D.  Repeat vitamin D in 3 months.

## 2022-06-11 LAB — CBC WITH DIFFERENTIAL/PLATELET
Absolute Monocytes: 439 cells/uL (ref 200–950)
Basophils Absolute: 60 cells/uL (ref 0–200)
Basophils Relative: 1.4 %
Eosinophils Absolute: 430 cells/uL (ref 15–500)
Eosinophils Relative: 10 %
HCT: 36.1 % (ref 35.0–45.0)
Hemoglobin: 12.1 g/dL (ref 11.7–15.5)
Lymphs Abs: 1875 cells/uL (ref 850–3900)
MCH: 30.1 pg (ref 27.0–33.0)
MCHC: 33.5 g/dL (ref 32.0–36.0)
MCV: 89.8 fL (ref 80.0–100.0)
MPV: 9.1 fL (ref 7.5–12.5)
Monocytes Relative: 10.2 %
Neutro Abs: 1496 cells/uL — ABNORMAL LOW (ref 1500–7800)
Neutrophils Relative %: 34.8 %
Platelets: 320 10*3/uL (ref 140–400)
RBC: 4.02 10*6/uL (ref 3.80–5.10)
RDW: 12.2 % (ref 11.0–15.0)
Total Lymphocyte: 43.6 %
WBC: 4.3 10*3/uL (ref 3.8–10.8)

## 2022-06-11 LAB — COMPLETE METABOLIC PANEL WITH GFR
AG Ratio: 1.3 (calc) (ref 1.0–2.5)
ALT: 9 U/L (ref 6–29)
AST: 11 U/L (ref 10–30)
Albumin: 4.2 g/dL (ref 3.6–5.1)
Alkaline phosphatase (APISO): 51 U/L (ref 31–125)
BUN: 9 mg/dL (ref 7–25)
CO2: 29 mmol/L (ref 20–32)
Calcium: 9.7 mg/dL (ref 8.6–10.2)
Chloride: 102 mmol/L (ref 98–110)
Creat: 0.88 mg/dL (ref 0.50–0.97)
Globulin: 3.2 g/dL (calc) (ref 1.9–3.7)
Glucose, Bld: 79 mg/dL (ref 65–99)
Potassium: 4.1 mmol/L (ref 3.5–5.3)
Sodium: 138 mmol/L (ref 135–146)
Total Bilirubin: 0.3 mg/dL (ref 0.2–1.2)
Total Protein: 7.4 g/dL (ref 6.1–8.1)
eGFR: 87 mL/min/{1.73_m2} (ref >=60)

## 2022-06-11 LAB — VITAMIN D 25 HYDROXY (VIT D DEFICIENCY, FRACTURES): Vit D, 25-Hydroxy: 24 ng/mL — ABNORMAL LOW (ref 30–100)

## 2022-06-11 LAB — QUANTIFERON-TB GOLD PLUS
Mitogen-NIL: 4.49 IU/mL
NIL: 0.03 IU/mL
QuantiFERON-TB Gold Plus: NEGATIVE
TB1-NIL: 0 IU/mL
TB2-NIL: <0 IU/mL

## 2022-06-12 NOTE — Progress Notes (Signed)
CBC, CMP are normal.  TB Gold negative,

## 2022-08-30 ENCOUNTER — Other Ambulatory Visit: Payer: Self-pay | Admitting: Rheumatology

## 2022-09-28 NOTE — L&D Delivery Note (Addendum)
Delivery Note At 3:18 AM a viable female was delivered via Vaginal, Spontaneous (Presentation: Right Occiput Anterior).  APGAR: 8,9; weight pending.   No nuchal cord noted.  Baby's nose and mouth not suctioned at perineum.  Shoulders were delivered without difficulty, Left shoulder was anterior.  Upon delivery, baby was placed on mother's abdomen where it was dried and stimulated.  Pitocin was started at this point.  Baby had a spontaneous cry and good tone noted.  Cord was clamped after 1 minute. Cord cut by patient.  Placenta delivered with gentle traction on cord and abdominal counter traction  Placenta status: Spontaneous, Intact.  Cord: 3 vessels with the following complications: None.  Amniotic fluid noted to be moderate meconium stained.  Anesthesia: 1% lidocaine local infiltration, 8 cc used.  Episiotomy: None Lacerations: 1st degree;Left Labial minora Suture Repair: 3.0 vicryl Est. Blood Loss (mL): 80  Mom to postpartum.  Baby to Couplet care / Skin to Skin. Prescilla Sours, MD.  04/02/2023, 3:41 AM

## 2022-10-26 NOTE — Progress Notes (Signed)
Office Visit Note  Patient: Meghan Arnold             Date of Birth: October 07, 1985           MRN: [id number]             PCP: Patient, No Pcp Per Referring: No ref. provider found Visit Date: 11/09/2022 Occupation: @GUAROCC$ @  Subjective:  Currently pregnant   History of Present Illness: Meghan Arnold is a 37 y.o. female with history of seropositive rheumatoid arthritis.  Patient reports that she is currently [redacted] weeks pregnant.  Patient reports that her last dose of Cimzia was administered at the end of November 2023.  She states that she has not had any signs or symptoms of a flare while off of Cimzia.  She has not needed to take any over-the-counter products for pain relief.  She denies any other new medical conditions.  She denies any recent or recurrent infections.  She has an appointment with her obstetrician today and will be finding out the gender of her baby today.  Patient reports over the past 2 days she has had some increased stiffness in her left hand especially in the index and middle finger.  She denies any tenderness or joint swelling currently.  Patient reports this past weekend she planted her 32-year-old's birthday party which could have contributed to her symptoms.  She denies any other joint pain or joint swelling at this time.   Activities of Daily Living:  Patient reports morning stiffness for 0 minutes.   Patient Denies nocturnal pain.  Difficulty dressing/grooming: Denies Difficulty climbing stairs: Denies Difficulty getting out of chair: Denies Difficulty using hands for taps, buttons, cutlery, and/or writing: Denies  Review of Systems  Constitutional: Negative.  Negative for fatigue.  HENT: Negative.  Negative for mouth sores and mouth dryness.   Eyes: Negative.  Negative for dryness.  Respiratory: Negative.  Negative for shortness of breath.   Cardiovascular: Negative.  Negative for chest pain and palpitations.  Gastrointestinal: Negative.  Negative for  blood in stool, constipation and diarrhea.  Endocrine: Negative.  Negative for increased urination.  Genitourinary: Negative.  Negative for involuntary urination.  Musculoskeletal: Negative.  Negative for joint pain, gait problem, joint pain, joint swelling, myalgias, muscle weakness, morning stiffness, muscle tenderness and myalgias.  Skin: Negative.  Negative for color change, rash, hair loss and sensitivity to sunlight.  Allergic/Immunologic: Negative.  Negative for susceptible to infections.  Neurological: Negative.  Negative for dizziness and headaches.  Hematological: Negative.  Negative for swollen glands.  Psychiatric/Behavioral: Negative.  Negative for depressed mood and sleep disturbance. The patient is not nervous/anxious.     PMFS History:  Patient Active Problem List   Diagnosis Date Noted   Encounter for induction of labor 11/05/2020   High risk medication use 12/03/2017   Raynaud's disease without gangrene 12/03/2017   Vitamin D deficiency 12/03/2017   Infertility counseling 07/02/2017   Fertility testing 05/04/2017   Seropositive rheumatoid arthritis ([place]) 11/17/2016   Arthralgia of multiple joints 11/17/2016   CRP elevated 11/17/2016   Elevated erythrocyte sedimentation rate 11/17/2016   Raynaud's phenomenon without gangrene 11/17/2016    Past Medical History:  Diagnosis Date   Benign tumor of ovary 2013   Chlamydia    Family history of breast cancer    Raynaud's phenomenon without gangrene    mgmt rheum Dr Annalee Genta   Rheumatoid arthritis ([place]) 2017   Wears contact lenses     Family History  Problem  Relation Age of Onset   Colon cancer Maternal Grandfather    Breast cancer Maternal Grandmother 72   Breast cancer Other 6   Breast cancer Cousin 40   Healthy Son    Past Surgical History:  Procedure Laterality Date   LAPAROSCOPIC SALPINGOOPHERECTOMY  2013   done by dr. Kerin Perna , believes right side laterality    LAPAROSCOPIC UNILATERAL  SALPINGECTOMY Right 12/01/2017   Procedure: LAPAROSCOPIC LYSIS OF ADHESIONS, RIGHT TUBAL OVARIOLYSIS, RIGHT OVARIAN CYST ASPIRATION,CHROMOPERTUBATION, RIGHT FIMBRIOPLASTY;  Surgeon: Governor Specking, MD;  Location: [facility];  Service: Gynecology;  Laterality: Right;   LAPAROSCOPY     OVARIAN CYST REMOVAL  08/2012   dr Glennon Mac at [facility] obgyn in [place] ; has abdominal insision    Social History   Social History Narrative   Not on file   Immunization History  Administered Date(s) Administered   Tdap 08/11/2016     Objective: Vital Signs: BP 121/78 (BP Location: Left Arm, Patient Position: Sitting, Cuff Size: Normal)   Pulse 83   Ht 5' 5"$  (1.651 m)   Wt 172 lb 6.4 oz (78.2 kg)   BMI 28.69 kg/m    Physical Exam Vitals and nursing note reviewed.  Constitutional:      Appearance: She is well-developed.  HENT:     Head: Normocephalic and atraumatic.  Eyes:     Conjunctiva/sclera: Conjunctivae normal.  Cardiovascular:     Rate and Rhythm: Normal rate and regular rhythm.     Heart sounds: Normal heart sounds.  Pulmonary:     Effort: Pulmonary effort is normal.     Breath sounds: Normal breath sounds.  Abdominal:     General: Bowel sounds are normal.     Palpations: Abdomen is soft.  Musculoskeletal:     Cervical back: Normal range of motion.  Lymphadenopathy:     Cervical: No cervical adenopathy.  Skin:    General: Skin is warm and dry.     Capillary Refill: Capillary refill takes less than 2 seconds.  Neurological:     Mental Status: She is alert and oriented to person, place, and time.  Psychiatric:        Behavior: Behavior normal.      Musculoskeletal Exam: C-spine, thoracic spine, lumbar spine have good range of motion.  Shoulder joints, elbow joints, wrist joints, MCPs, PIPs, DIPs have good range of motion with no synovitis.  Complete fist formation bilaterally.  Hip joints have good range of motion with no groin pain.  Knee joints have good  range of motion with no warmth or effusion.  Ankle joints have good range of motion with no tenderness or joint swelling.  CDAI Exam: CDAI Score: -- Patient Global: 0 mm; Provider Global: 0 mm Swollen: --; Tender: -- Joint Exam 11/09/2022   No joint exam has been documented for this visit   There is currently no information documented on the homunculus. Go to the Rheumatology activity and complete the homunculus joint exam.  Investigation: No additional findings.  Imaging: No results found.  Recent Labs: Lab Results  Component Value Date   WBC 4.3 06/09/2022   HGB 12.1 06/09/2022   PLT 320 06/09/2022   NA 138 06/09/2022   K 4.1 06/09/2022   CL 102 06/09/2022   CO2 29 06/09/2022   GLUCOSE 79 06/09/2022   BUN 9 06/09/2022   CREATININE 0.88 06/09/2022   BILITOT 0.3 06/09/2022   ALKPHOS 101 11/06/2020   AST 11 06/09/2022   ALT 9 06/09/2022  PROT 7.4 06/09/2022   ALBUMIN 2.7 (L) 11/06/2020   CALCIUM 9.7 06/09/2022   GFRAA 125 03/11/2021   QFTBGOLDPLUS NEGATIVE 06/09/2022    Speciality Comments: Cimzia 06/19- restarted December 06, 2020  Procedures:  No procedures performed Allergies: Patient has no known allergies.    Assessment / Plan:     Visit Diagnoses: Rheumatoid arthritis involving multiple sites with positive rheumatoid factor (HCC) - Positive RF and positive anti-CCP, erosive changes in hands and feet: She has no joint tenderness or synovitis on examination today.  She is experiencing some increased stiffness in the left index and middle finger x2 days.  She has not been experiencing any nocturnal pain or difficulty with ADLs.  This past weekend she had planned her son's second birthday which she feels may have contributed to her increased joint stiffness.  On examination she has no synovitis.  No other joint pain or joint swelling at this time.  Patient is currently [redacted] weeks pregnant.  She will be following up with her obstetrician today to find out the gender.   Her last dose of Cimzia was administered in November 2023 prior to finding out that she was pregnant.  She has not needed to take any over-the-counter products or prednisone for symptomatic relief.  She has been asymptomatic while off of Cimzia.  Discussed that if she has any recurrence of symptoms I would recommend getting back on Cimzia on a maintenance dosing schedule.  Discussed that Cimzia is safe while pregnant and breast-feeding.  She plans on further discussing with her obstetrician today.  She will notify us when and if she would like to resume Cimzia as prescribed.  She will follow-up in the office in 3 months or sooner if needed.  High risk medication use -Patient is currently [redacted] weeks pregnant. Currently holding-Cimzia 200 mg sq injections every 2 weeks. D/c SSZ-low WBC count. CBC and CMP drawn on 06/09/22.  She will be having lab work on a regular basis with her OB-GYN. TB gold negative on 06/09/22.  No recent or recurrent infections.    Raynaud's disease without gangrene: Not currently active.  No digital ulcers.   Vitamin D deficiency: Taking prenatal vitamins.   Rash: Eczema on hands.    Orders: No orders of the defined types were placed in this encounter.  No orders of the defined types were placed in this encounter.    Follow-Up Instructions: Return in about 3 months (around 02/07/2023) for Rheumatoid arthritis.   Ofilia Neas, PA-C  Note - This record has been created using Dragon software.  Chart creation errors have been sought, but may not always  have been located. Such creation errors do not reflect on  the standard of medical care.

## 2022-11-09 ENCOUNTER — Encounter: Payer: Self-pay | Admitting: Physician Assistant

## 2022-11-09 ENCOUNTER — Ambulatory Visit: Payer: Medicaid Other | Attending: Physician Assistant | Admitting: Physician Assistant

## 2022-11-09 VITALS — BP 121/78 | HR 83 | Ht 65.0 in | Wt 172.4 lb

## 2022-11-09 DIAGNOSIS — R21 Rash and other nonspecific skin eruption: Secondary | ICD-10-CM

## 2022-11-09 DIAGNOSIS — M0579 Rheumatoid arthritis with rheumatoid factor of multiple sites without organ or systems involvement: Secondary | ICD-10-CM | POA: Diagnosis not present

## 2022-11-09 DIAGNOSIS — E559 Vitamin D deficiency, unspecified: Secondary | ICD-10-CM | POA: Diagnosis not present

## 2022-11-09 DIAGNOSIS — I7300 Raynaud's syndrome without gangrene: Secondary | ICD-10-CM

## 2022-11-09 DIAGNOSIS — Z79899 Other long term (current) drug therapy: Secondary | ICD-10-CM | POA: Diagnosis not present

## 2022-11-09 NOTE — Patient Instructions (Signed)
Standing Labs We placed an order today for your standing lab work.   Please have your standing labs drawn in May and every 3 months   Please have your labs drawn 2 weeks prior to your appointment so that the provider can discuss your lab results at your appointment.  Please note that you may see your imaging and lab results in [place] before we have reviewed them. We will contact you once all results are reviewed. Please allow our office up to 72 hours to thoroughly review all of the results before contacting the office for clarification of your results.  Lab hours are:   Monday through Thursday from 8:00 am -12:30 pm and 1:00 pm-5:00 pm and Friday from 8:00 am-12:00 pm.  Please be advised, all patients with office appointments requiring lab work will take precedent over walk-in lab work.   Labs are drawn by [facility]. Please bring your co-pay at the time of your lab draw.  You may receive a bill from [facility] for your lab work.  Please note if you are on Hydroxychloroquine and and an order has been placed for a Hydroxychloroquine level, you will need to have it drawn 4 hours or more after your last dose.  If you wish to have your labs drawn at another location, please call the office 24 hours in advance so we can fax the orders.  The office is located at [address] [place], [place], [place] [address] No appointment is necessary.    If you have any questions regarding directions or hours of operation,  please call [phone].   As a reminder, please drink plenty of water prior to coming for your lab work. Thanks!

## 2022-11-10 NOTE — Telephone Encounter (Signed)
Please obtain most recent lab work from PCP or gynecologist.   Recommend updating CBC and CMP in 1 month then every 3 months.  TB gold is up to date.

## 2023-01-28 NOTE — Progress Notes (Unsigned)
Office Visit Note  Patient: Meghan Arnold             Date of Birth: 09-05-1986           MRN: [id number]             PCP: Patient, No Pcp Per Referring: No ref. provider found Visit Date: 02/09/2023 Occupation: @GUAROCC @  Subjective:    History of Present Illness: Meghan Arnold is a 37 y.o. female with history of seropositive rheumatoid arthritis.  Patient was last seen in the office on 11/09/2022.  She continues to hold Cimzia during her pregnancy.   CBC and CMP were drawn on 06/09/2022. TB Gold negative on 06/09/2022.  Activities of Daily Living:  Patient reports morning stiffness for *** {minute/hour:19697}.   Patient {ACTIONS;DENIES/REPORTS:21021675::"Denies"} nocturnal pain.  Difficulty dressing/grooming: {ACTIONS;DENIES/REPORTS:21021675::"Denies"} Difficulty climbing stairs: {ACTIONS;DENIES/REPORTS:21021675::"Denies"} Difficulty getting out of chair: {ACTIONS;DENIES/REPORTS:21021675::"Denies"} Difficulty using hands for taps, buttons, cutlery, and/or writing: {ACTIONS;DENIES/REPORTS:21021675::"Denies"}  No Rheumatology ROS completed.   PMFS History:  Patient Active Problem List   Diagnosis Date Noted   Encounter for induction of labor 11/05/2020   High risk medication use 12/03/2017   Raynaud's disease without gangrene 12/03/2017   Vitamin D deficiency 12/03/2017   Infertility counseling 07/02/2017   Fertility testing 05/04/2017   Seropositive rheumatoid arthritis (HCC) 11/17/2016   Arthralgia of multiple joints 11/17/2016   CRP elevated 11/17/2016   Elevated erythrocyte sedimentation rate 11/17/2016   Raynaud's phenomenon without gangrene 11/17/2016    Past Medical History:  Diagnosis Date   Benign tumor of ovary 2013   Chlamydia    Family history of breast cancer    Raynaud's phenomenon without gangrene    mgmt rheum Dr Tresa Moore   Rheumatoid arthritis (HCC) 2017   Wears contact lenses     Family History  Problem Relation Age of Onset   Colon  cancer Maternal Grandfather    Breast cancer Maternal Grandmother 72   Breast cancer Other 48   Breast cancer Cousin 49   Healthy Son    Past Surgical History:  Procedure Laterality Date   LAPAROSCOPIC SALPINGOOPHERECTOMY  2013   done by dr. April Manson , believes right side laterality    LAPAROSCOPIC UNILATERAL SALPINGECTOMY Right 12/01/2017   Procedure: LAPAROSCOPIC LYSIS OF ADHESIONS, RIGHT TUBAL OVARIOLYSIS, RIGHT OVARIAN CYST ASPIRATION,CHROMOPERTUBATION, RIGHT FIMBRIOPLASTY;  Surgeon: Fermin Schwab, MD;  Location: [facility];  Service: Gynecology;  Laterality: Right;   LAPAROSCOPY     OVARIAN CYST REMOVAL  08/2012   dr Jean Rosenthal at [facility] obgyn in [place] ; has abdominal insision    Social History   Social History Narrative   Not on file   Immunization History  Administered Date(s) Administered   Tdap 08/11/2016     Objective: Vital Signs: There were no vitals taken for this visit.   Physical Exam Vitals and nursing note reviewed.  Constitutional:      Appearance: She is well-developed.  HENT:     Head: Normocephalic and atraumatic.  Eyes:     Conjunctiva/sclera: Conjunctivae normal.  Cardiovascular:     Rate and Rhythm: Normal rate and regular rhythm.     Heart sounds: Normal heart sounds.  Pulmonary:     Effort: Pulmonary effort is normal.     Breath sounds: Normal breath sounds.  Abdominal:     General: Bowel sounds are normal.     Palpations: Abdomen is soft.  Musculoskeletal:     Cervical back: Normal range of motion.  Lymphadenopathy:  Cervical: No cervical adenopathy.  Skin:    General: Skin is warm and dry.     Capillary Refill: Capillary refill takes less than 2 seconds.  Neurological:     Mental Status: She is alert and oriented to person, place, and time.  Psychiatric:        Behavior: Behavior normal.      Musculoskeletal Exam: ***  CDAI Exam: CDAI Score: -- Patient Global: --; Provider Global: -- Swollen: --;  Tender: -- Joint Exam 02/09/2023   No joint exam has been documented for this visit   There is currently no information documented on the homunculus. Go to the Rheumatology activity and complete the homunculus joint exam.  Investigation: No additional findings.  Imaging: No results found.  Recent Labs: Lab Results  Component Value Date   WBC 4.3 06/09/2022   HGB 12.1 06/09/2022   PLT 320 06/09/2022   NA 138 06/09/2022   K 4.1 06/09/2022   CL 102 06/09/2022   CO2 29 06/09/2022   GLUCOSE 79 06/09/2022   BUN 9 06/09/2022   CREATININE 0.88 06/09/2022   BILITOT 0.3 06/09/2022   ALKPHOS 101 11/06/2020   AST 11 06/09/2022   ALT 9 06/09/2022   PROT 7.4 06/09/2022   ALBUMIN 2.7 (L) 11/06/2020   CALCIUM 9.7 06/09/2022   GFRAA 125 03/11/2021   QFTBGOLDPLUS NEGATIVE 06/09/2022    Speciality Comments: Cimzia 06/19- restarted December 06, 2020  Procedures:  No procedures performed Allergies: Patient has no known allergies.   Assessment / Plan:     Visit Diagnoses: Rheumatoid arthritis involving multiple sites with positive rheumatoid factor (HCC)  High risk medication use  Raynaud's disease without gangrene  Vitamin D deficiency  Orders: No orders of the defined types were placed in this encounter.  No orders of the defined types were placed in this encounter.   Face-to-face time spent with patient was *** minutes. Greater than 50% of time was spent in counseling and coordination of care.  Follow-Up Instructions: No follow-ups on file.   Gearldine Bienenstock, PA-C  Note - This record has been created using Dragon software.  Chart creation errors have been sought, but may not always  have been located. Such creation errors do not reflect on  the standard of medical care.

## 2023-02-09 ENCOUNTER — Encounter: Payer: Self-pay | Admitting: Physician Assistant

## 2023-02-09 ENCOUNTER — Ambulatory Visit: Payer: Medicaid Other | Attending: Physician Assistant | Admitting: Physician Assistant

## 2023-02-09 VITALS — BP 127/83 | HR 81 | Resp 15 | Ht 65.0 in | Wt 178.6 lb

## 2023-02-09 DIAGNOSIS — Z111 Encounter for screening for respiratory tuberculosis: Secondary | ICD-10-CM

## 2023-02-09 DIAGNOSIS — I7300 Raynaud's syndrome without gangrene: Secondary | ICD-10-CM | POA: Diagnosis not present

## 2023-02-09 DIAGNOSIS — E559 Vitamin D deficiency, unspecified: Secondary | ICD-10-CM

## 2023-02-09 DIAGNOSIS — Z79899 Other long term (current) drug therapy: Secondary | ICD-10-CM

## 2023-02-09 DIAGNOSIS — M0579 Rheumatoid arthritis with rheumatoid factor of multiple sites without organ or systems involvement: Secondary | ICD-10-CM

## 2023-02-10 ENCOUNTER — Telehealth: Payer: Self-pay

## 2023-02-10 LAB — CBC WITH DIFFERENTIAL/PLATELET
Absolute Monocytes: 445 cells/uL (ref 200–950)
Basophils Absolute: 30 cells/uL (ref 0–200)
Basophils Relative: 0.6 %
Eosinophils Absolute: 355 cells/uL (ref 15–500)
Eosinophils Relative: 7.1 %
HCT: 32.3 % — ABNORMAL LOW (ref 35.0–45.0)
Hemoglobin: 10.8 g/dL — ABNORMAL LOW (ref 11.7–15.5)
Lymphs Abs: 1310 cells/uL (ref 850–3900)
MCH: 30.1 pg (ref 27.0–33.0)
MCHC: 33.4 g/dL (ref 32.0–36.0)
MCV: 90 fL (ref 80.0–100.0)
MPV: 9.1 fL (ref 7.5–12.5)
Monocytes Relative: 8.9 %
Neutro Abs: 2860 cells/uL (ref 1500–7800)
Neutrophils Relative %: 57.2 %
Platelets: 214 10*3/uL (ref 140–400)
RBC: 3.59 10*6/uL — ABNORMAL LOW (ref 3.80–5.10)
RDW: 12.6 % (ref 11.0–15.0)
Total Lymphocyte: 26.2 %
WBC: 5 10*3/uL (ref 3.8–10.8)

## 2023-02-10 LAB — COMPLETE METABOLIC PANEL WITH GFR
AG Ratio: 1.1 (calc) (ref 1.0–2.5)
ALT: 5 U/L — ABNORMAL LOW (ref 6–29)
AST: 13 U/L (ref 10–30)
Albumin: 3.6 g/dL (ref 3.6–5.1)
Alkaline phosphatase (APISO): 77 U/L (ref 31–125)
BUN/Creatinine Ratio: 7 (calc) (ref 6–22)
BUN: 4 mg/dL — ABNORMAL LOW (ref 7–25)
CO2: 24 mmol/L (ref 20–32)
Calcium: 9 mg/dL (ref 8.6–10.2)
Chloride: 105 mmol/L (ref 98–110)
Creat: 0.54 mg/dL (ref 0.50–0.97)
Globulin: 3.2 g/dL (calc) (ref 1.9–3.7)
Glucose, Bld: 72 mg/dL (ref 65–99)
Potassium: 4.2 mmol/L (ref 3.5–5.3)
Sodium: 137 mmol/L (ref 135–146)
Total Bilirubin: 0.2 mg/dL (ref 0.2–1.2)
Total Protein: 6.8 g/dL (ref 6.1–8.1)
eGFR: 121 mL/min/{1.73_m2} (ref >=60)

## 2023-02-10 NOTE — Progress Notes (Signed)
CMP stable RBC count, hgb, and hct are low--anemic.  Please advise patient to reach out to gynecologist.  Continue prenatal vitamin daily.

## 2023-02-10 NOTE — Telephone Encounter (Signed)
Per Rema Jasmine, current Prior Authorization is expiring.  Submitted a Prior Authorization request to  [employer]  for CIMZIA via CoverMyMeds. Will update once we receive a response.   Key: [id number]

## 2023-02-10 NOTE — Telephone Encounter (Signed)
Received notification from  [facility]  regarding a prior authorization for [facility]. Authorization has been APPROVED from 02/10/2023 to 02/10/2024. Approval letter sent to scan center. Updated [employer] with current Prior Authorization information.  Authorization # [id number]

## 2023-04-02 ENCOUNTER — Other Ambulatory Visit: Payer: Self-pay

## 2023-04-02 ENCOUNTER — Inpatient Hospital Stay (HOSPITAL_COMMUNITY)
Admission: AD | Admit: 2023-04-02 | Discharge: 2023-04-03 | DRG: 807 | Disposition: A | Discharge status: Home / Self Care | Payer: Medicaid Other | Attending: Obstetrics and Gynecology | Admitting: Obstetrics and Gynecology

## 2023-04-02 ENCOUNTER — Encounter (HOSPITAL_COMMUNITY): Payer: Self-pay | Admitting: Obstetrics & Gynecology

## 2023-04-02 DIAGNOSIS — Z87891 Personal history of nicotine dependence: Secondary | ICD-10-CM | POA: Diagnosis not present

## 2023-04-02 DIAGNOSIS — O480 Post-term pregnancy: Principal | ICD-10-CM | POA: Diagnosis present

## 2023-04-02 DIAGNOSIS — Z7982 Long term (current) use of aspirin: Secondary | ICD-10-CM

## 2023-04-02 DIAGNOSIS — Z3A40 40 weeks gestation of pregnancy: Secondary | ICD-10-CM

## 2023-04-02 DIAGNOSIS — Z3490 Encounter for supervision of normal pregnancy, unspecified, unspecified trimester: Secondary | ICD-10-CM

## 2023-04-02 LAB — COMPREHENSIVE METABOLIC PANEL
ALT: 13 U/L (ref 0–44)
AST: 21 U/L (ref 15–41)
Albumin: 3.2 g/dL — ABNORMAL LOW (ref 3.5–5.0)
Alkaline Phosphatase: 130 U/L — ABNORMAL HIGH (ref 38–126)
Anion gap: 17 — ABNORMAL HIGH (ref 5–15)
BUN: 7 mg/dL (ref 6–20)
CO2: 19 mmol/L — ABNORMAL LOW (ref 22–32)
Calcium: 9.8 mg/dL (ref 8.9–10.3)
Chloride: 98 mmol/L (ref 98–111)
Creatinine, Ser: 0.77 mg/dL (ref 0.44–1.00)
GFR, Estimated: >60 mL/min (ref >60)
Glucose, Bld: 106 mg/dL — ABNORMAL HIGH (ref 70–99)
Potassium: 3.5 mmol/L (ref 3.5–5.1)
Sodium: 134 mmol/L — ABNORMAL LOW (ref 135–145)
Total Bilirubin: 0.3 mg/dL (ref 0.3–1.2)
Total Protein: 7.3 g/dL (ref 6.5–8.1)

## 2023-04-02 LAB — CBC
HCT: 33.6 % — ABNORMAL LOW (ref 36.0–46.0)
Hemoglobin: 11.2 g/dL — ABNORMAL LOW (ref 12.0–15.0)
MCH: 29.5 pg (ref 26.0–34.0)
MCHC: 33.3 g/dL (ref 30.0–36.0)
MCV: 88.4 fL (ref 80.0–100.0)
Platelets: 206 10*3/uL (ref 150–400)
RBC: 3.8 MIL/uL — ABNORMAL LOW (ref 3.87–5.11)
RDW: 13.4 % (ref 11.5–15.5)
WBC: 6.9 10*3/uL (ref 4.0–10.5)
nRBC: 0 % (ref 0.0–0.2)

## 2023-04-02 LAB — TYPE AND SCREEN
ABO/RH(D): O POS
Antibody Screen: NEGATIVE

## 2023-04-02 LAB — RPR: RPR Ser Ql: NONREACTIVE

## 2023-04-02 MED ORDER — PHENYLEPHRINE 80 MCG/ML (10ML) SYRINGE FOR IV PUSH (FOR BLOOD PRESSURE SUPPORT): 80.0000 ug | PREFILLED_SYRINGE | INTRAVENOUS | Status: DC | PRN

## 2023-04-02 MED ORDER — IBUPROFEN 600 MG PO TABS: 600.0000 mg | ORAL_TABLET | Freq: Four times a day (QID) | ORAL | 0 refills | Status: DC

## 2023-04-02 MED ORDER — BENZOCAINE-MENTHOL 20-0.5 % EX AERO
1.0000 | INHALATION_SPRAY | CUTANEOUS | Status: DC | PRN
  Administered 2023-04-02: 1 via TOPICAL
  Filled 2023-04-02: qty 56

## 2023-04-02 MED ORDER — SOD CITRATE-CITRIC ACID 500-334 MG/5ML PO SOLN: 30.0000 mL | ORAL | Status: DC | PRN

## 2023-04-02 MED ORDER — DIBUCAINE (PERIANAL) 1 % EX OINT: 1.0000 | TOPICAL_OINTMENT | CUTANEOUS | Status: DC | PRN

## 2023-04-02 MED ORDER — OXYCODONE-ACETAMINOPHEN 5-325 MG PO TABS: 1.0000 | ORAL_TABLET | ORAL | Status: DC | PRN

## 2023-04-02 MED ORDER — ZOLPIDEM TARTRATE 5 MG PO TABS: 5.0000 mg | ORAL_TABLET | Freq: Every evening | ORAL | Status: DC | PRN

## 2023-04-02 MED ORDER — WITCH HAZEL-GLYCERIN EX PADS: 1.0000 | MEDICATED_PAD | CUTANEOUS | Status: DC | PRN

## 2023-04-02 MED ORDER — IBUPROFEN 600 MG PO TABS
600.0000 mg | ORAL_TABLET | Freq: Four times a day (QID) | ORAL | Status: DC
  Administered 2023-04-02 – 2023-04-03 (×6): 600 mg via ORAL
  Filled 2023-04-02 (×6): qty 1

## 2023-04-02 MED ORDER — ACETAMINOPHEN 325 MG PO TABS: 650.0000 mg | ORAL_TABLET | ORAL | Status: DC | PRN

## 2023-04-02 MED ORDER — EPHEDRINE 5 MG/ML INJ: 10.0000 mg | INTRAVENOUS | Status: DC | PRN

## 2023-04-02 MED ORDER — DIPHENHYDRAMINE HCL 25 MG PO CAPS: 25.0000 mg | ORAL_CAPSULE | Freq: Four times a day (QID) | ORAL | Status: DC | PRN

## 2023-04-02 MED ORDER — DIPHENHYDRAMINE HCL 50 MG/ML IJ SOLN: 12.5000 mg | INTRAMUSCULAR | Status: DC | PRN

## 2023-04-02 MED ORDER — OXYCODONE-ACETAMINOPHEN 5-325 MG PO TABS: 2.0000 | ORAL_TABLET | ORAL | Status: DC | PRN

## 2023-04-02 MED ORDER — ONDANSETRON HCL 4 MG/2ML IJ SOLN: 4.0000 mg | Freq: Four times a day (QID) | INTRAMUSCULAR | Status: DC | PRN

## 2023-04-02 MED ORDER — OXYTOCIN-SODIUM CHLORIDE 30-0.9 UT/500ML-% IV SOLN
2.5000 [IU]/h | INTRAVENOUS | Status: DC
  Administered 2023-04-02: 2.5 [IU]/h via INTRAVENOUS
  Filled 2023-04-02: qty 500

## 2023-04-02 MED ORDER — FENTANYL-BUPIVACAINE-NACL 0.5-0.125-0.9 MG/250ML-% EP SOLN
12.0000 mL/h | EPIDURAL | Status: DC | PRN
  Filled 2023-04-02: qty 250

## 2023-04-02 MED ORDER — SIMETHICONE 80 MG PO CHEW: 80.0000 mg | CHEWABLE_TABLET | ORAL | Status: DC | PRN

## 2023-04-02 MED ORDER — COCONUT OIL OIL: 1.0000 | TOPICAL_OIL | Status: DC | PRN

## 2023-04-02 MED ORDER — PRENATAL MULTIVITAMIN CH
1.0000 | ORAL_TABLET | Freq: Every day | ORAL | Status: DC
  Administered 2023-04-02 – 2023-04-03 (×2): 1 via ORAL
  Filled 2023-04-02 (×2): qty 1

## 2023-04-02 MED ORDER — LIDOCAINE HCL (PF) 1 % IJ SOLN
30.0000 mL | INTRAMUSCULAR | Status: DC | PRN
  Administered 2023-04-02: 30 mL via SUBCUTANEOUS
  Filled 2023-04-02: qty 30

## 2023-04-02 MED ORDER — SENNOSIDES-DOCUSATE SODIUM 8.6-50 MG PO TABS
2.0000 | ORAL_TABLET | Freq: Every day | ORAL | Status: DC
  Administered 2023-04-03: 2 via ORAL
  Filled 2023-04-02: qty 2

## 2023-04-02 MED ORDER — LACTATED RINGERS IV SOLN: 500.0000 mL | INTRAVENOUS | Status: DC | PRN

## 2023-04-02 MED ORDER — LACTATED RINGERS IV SOLN
500.0000 mL | Freq: Once | INTRAVENOUS | Status: AC
  Administered 2023-04-02: 500 mL via INTRAVENOUS

## 2023-04-02 MED ORDER — OXYTOCIN-SODIUM CHLORIDE 30-0.9 UT/500ML-% IV SOLN: 2.5000 [IU]/h | INTRAVENOUS | Status: DC | PRN

## 2023-04-02 MED ORDER — MEASLES, MUMPS & RUBELLA VAC IJ SOLR: 0.5000 mL | Freq: Once | INTRAMUSCULAR | Status: DC

## 2023-04-02 MED ORDER — ONDANSETRON HCL 4 MG/2ML IJ SOLN: 4.0000 mg | INTRAMUSCULAR | Status: DC | PRN

## 2023-04-02 MED ORDER — LACTATED RINGERS IV SOLN: INTRAVENOUS | Status: DC

## 2023-04-02 MED ORDER — ONDANSETRON HCL 4 MG PO TABS: 4.0000 mg | ORAL_TABLET | ORAL | Status: DC | PRN

## 2023-04-02 MED ORDER — OXYTOCIN BOLUS FROM INFUSION
333.0000 mL | Freq: Once | INTRAVENOUS | Status: AC
  Administered 2023-04-02: 333 mL via INTRAVENOUS

## 2023-04-02 NOTE — MAU Note (Addendum)
0218: Patient pulled straight back to room 22 via wheelchair. Patient reports contractions every minute; denies VB, LOF and reports +FM. Denies complications in this pregnancy, PIH s/s.   Rates contractions 10/10, planning on epidural.  Receives PNC at [facility], reports last SVE 3 cm in office.

## 2023-04-02 NOTE — Progress Notes (Signed)
Postpartum Note Day #0  S:  Patient doing well.  Pain controlled.  Tolerating regular diet.   Ambulating and voiding without difficulty. Denies fevers, chills, chest pain, SOB, N/V, or worsening bilateral LE edema.  Lochia: Minimal Infant feeding:  Breast Circumcision:  N/A, female infant Contraception:  None  O: Temp:  [98.4 F (36.9 C)-98.7 F (37.1 C)] 98.4 F (36.9 C) (07/05 0627) Pulse Rate:  [68-142] 68 (07/05 0627) Resp:  [18] 18 (07/05 0627) BP: (103-154)/(65-137) 134/77 (07/05 0627) SpO2:  [100 %] 100 % (07/05 0627) Weight:  [82.1 kg] 82.1 kg (07/05 0256) Gen: NAD, pleasant and cooperative CV: Regular rate Resp: Normal work of breathing Abdomen: soft, non-distended, non-tender throughout Uterus: firm, non-tender, below umbilicus Ext: No bilateral LE edema, no bilateral calf tenderness  Labs:  Recent Labs    04/02/23 0243  HGB 11.2*  HCT 33.6*    A/P: Patient is a 37 y.o. [id number] PPD#0 s/p SVD.  - Pain well controlled  - GU: UOP is adequate - GI: Tolerating regular diet - Activity: encouraged sitting up to chair and ambulation as tolerated - DVT Prophylaxis: Ambulation - Labs: ordered for tomorrow  Disposition:  D/C home likely PPD#1.   Steva Ready, DO

## 2023-04-02 NOTE — H&P (Signed)
Meghan Arnold is a 37 y.o. female presenting for labor. G2P1001  at 40 weeks 2 days EGA, with LMP 06/25/23 EDC 03/31/23 by LMP.  PAtient with contractions. She denies vaginal bleeding or leakage of fluid. With normal fetal movement.   Prenatal care provided at [facility].  Prenatal course significant for: AMA, had serial fetal testing.    OB History     Gravida  2   Para  1   Term  1   Preterm  0   AB  0   Living  1      SAB  0   IAB  0   Ectopic  0   Multiple  0   Live Births  1          Past Medical History:  Diagnosis Date   Benign tumor of ovary 2013   Chlamydia    Family history of breast cancer    Raynaud's phenomenon without gangrene    mgmt rheum Dr Tresa Moore   Rheumatoid arthritis (HCC) 2017   Wears contact lenses    Past Surgical History:  Procedure Laterality Date   LAPAROSCOPIC SALPINGOOPHERECTOMY  2013   done by dr. April Manson , believes right side laterality    LAPAROSCOPIC UNILATERAL SALPINGECTOMY Right 12/01/2017   Procedure: LAPAROSCOPIC LYSIS OF ADHESIONS, RIGHT TUBAL OVARIOLYSIS, RIGHT OVARIAN CYST ASPIRATION,CHROMOPERTUBATION, RIGHT FIMBRIOPLASTY;  Surgeon: Fermin Schwab, MD;  Location: [facility];  Service: Gynecology;  Laterality: Right;   LAPAROSCOPY     OVARIAN CYST REMOVAL  08/2012   dr Jean Rosenthal at [facility] obgyn in [place] ; has abdominal insision    Family History: family history includes Breast cancer (age of onset: 23) in her cousin; Breast cancer (age of onset: 19) in an other family member; Breast cancer (age of onset: 84) in her maternal grandmother; Colon cancer in her maternal grandfather; Healthy in her son. Social History:  reports that she has quit smoking. Her smoking use included cigarettes. She has never been exposed to tobacco smoke. She has never used smokeless tobacco. She reports that she does not currently use alcohol. She reports that she does not currently use drugs.     Maternal  Diabetes: No Genetic Screening: Abnormal:  Results: Other: SMA carrier, FOB is negative.  Maternal Ultrasounds/Referrals: Normal Fetal Ultrasounds or other Referrals:  None Maternal Substance Abuse:  No Significant Maternal Medications:  None Significant Maternal Lab Results:  Group B Strep negative Number of Prenatal Visits:greater than 3 verified prenatal visits Other Comments:  None  Review of Systems History Dilation: 6 Effacement (%): 70 Exam by:: Henrine Screws RN unknown if currently breastfeeding. Exam Physical Exam  Blood pressure (!) 140/75, pulse 89, height 5\' 5"  (1.651 m), weight 82.1 kg, unknown if currently breastfeeding.  Constitutional: She is oriented to person, place, and time. She appears well-developed and well-nourished.     04/02/2023    2:56 AM 02/09/2023    8:37 AM 11/09/2022    8:01 AM  Vitals with BMI  Height 5\' 5"  5\' 5"  5\' 5"   Weight 181 lbs 178 lbs 10 oz 172 lbs 6 oz  BMI 30.12 29.72 28.69  Systolic 140 127 540  Diastolic 75 83 78  Pulse 89 81 83    HENT:  Head: Normocephalic and atraumatic.  Neck: Normal range of motion.  Cardiovascular: Normal rate, regular rhythm and normal heart sounds.   Respiratory: Effort normal and breath sounds normal.  GI: Soft. Bowel sounds are normal. Genitorurinary: Gravid uterus, appropriate  for gestational age.   With adequate pelvis.    Neurological: She is alert and oriented to person, place, and time.  Skin: Skin is warm and dry.  Psychiatric: She has a normal mood and affect. Her behavior is normal.     EFM: 130 baseline, moderate variability, reactive. TOCO: With contractions every 2 to 3 minutes.   Prenatal labs: ABO, Rh:  O positive. Antibody:  Negative.  Rubella: Non immune RPR:  NR  HBsAg:   Negative  HIV:   Negative GBS:  Negative.   Current Outpatient Medications  Medication Instructions   aspirin EC 81 mg, Oral, Daily, Swallow whole.   Azelaic Acid 15 % gel Apply topically.   certolizumab  pegol (CIMZIA) 200 mg, Subcutaneous, Every 14 days   Clindamycin-Benzoyl Per, Refr, gel Apply topically.   FeroSul 325 mg, Oral, 2 times daily   Prenatal Vit-Fe Fumarate-FA (PRENATAL MULTIVITAMIN) TABS tablet 1 tablet, Oral, Daily at bedtime   VITAMIN D PO Daily   Vitamin D3 50,000 Units, Oral, Weekly    No Known Allergies   Assessment/Plan: 37 y/o G2P1001 at 40 weeks 2 days EGA in active labor, - Admit to [facility] and Delivery as per admit orders.  -Anticipate NSVD. - Recommend MMR vaccine postpartum.    Prescilla Sours, MD.  04/02/2023, 2:52 AM

## 2023-04-02 NOTE — Lactation Note (Signed)
This note was copied from a baby's chart. Lactation Consultation Note  Patient Name: Girl Ashai Casablanca ZOXWR'U Date: 04/02/2023 Age:38 hours Reason for consult: Initial assessment;Term (See Birth Parent- MR: Hx of GHTN and Anemia.) P2, term female infant, Per Birth Parent infant is breastfeeding well, no concerns for LC at this time, LC did not observe latch, infant recently BF 1740 to 1800 pm for 20 minutes. Infant had one void and 5 stools since birth. Birth Parent already knows how to hand express and infant was given few drops of colostrum by spoon, Birth Parent BF her two year old son for 22 months. Birth Parent will continue to BF infant by cues, on demand, 8 to 12+ time within 24 hours, skin to skin. Birth Parent knows to call RN/LC if she needs latch assistance. LC discussed importance of maternal rest, diet and hydration. Birth Parent was made aware of O/P services, breastfeeding support groups, community resources, and our phone # for post-discharge questions.    Maternal Data Has patient been taught Hand Expression?: Yes Birth Parent BF her 28 year old son for 22 months.   Feeding Mother's Current Feeding Choice: Breast Milk  LATCH Score LC did not observe latch at this time due to infant recently BF prior to [facility] entering the room.                   Lactation Tools Discussed/Used    Interventions Interventions: Position options;Skin to skin;Breast compression;LC Services brochure;Breast feeding basics reviewed  Discharge Pump: DEBP;Personal  Consult Status Consult Status: Follow-up Date: 04/03/23 Follow-up type: In-patient    Frederico Hamman 04/02/2023, 6:44 PM

## 2023-04-03 LAB — CBC
HCT: 34.1 % — ABNORMAL LOW (ref 36.0–46.0)
Hemoglobin: 11.2 g/dL — ABNORMAL LOW (ref 12.0–15.0)
MCH: 29.9 pg (ref 26.0–34.0)
MCHC: 32.8 g/dL (ref 30.0–36.0)
MCV: 90.9 fL (ref 80.0–100.0)
Platelets: 245 10*3/uL (ref 150–400)
RBC: 3.75 MIL/uL — ABNORMAL LOW (ref 3.87–5.11)
RDW: 13.7 % (ref 11.5–15.5)
WBC: 8.7 10*3/uL (ref 4.0–10.5)
nRBC: 0 % (ref 0.0–0.2)

## 2023-04-03 NOTE — Discharge Summary (Signed)
Postpartum Discharge Summary  Date of Service updated 04/03/23    Patient Name: Meghan Arnold DOB: 07/02/1986 MRN: [id number]  Date of admission: 04/02/2023 Delivery date:04/02/2023  Delivering provider: Hoover Browns  Date of discharge: 04/03/2023  Admitting diagnosis: Normal labor [O80, Z37.9] Intrauterine pregnancy: [redacted]w[redacted]d     Secondary diagnosis:  Principal Problem:   Normal labor  Additional problems: Rubella non-immune    Discharge diagnosis: Term Pregnancy Delivered                                              Post partum procedures: none Augmentation: AROM Complications: None  Hospital course: Onset of Labor With Vaginal Delivery      37 y.o. yo G2P2002 at [redacted]w[redacted]d was admitted in Active Labor on 04/02/2023. Labor was precipitous and uncomplicated  Membrane Rupture Time/Date: 3:12 AM ,04/02/2023   Delivery Method:Vaginal, Spontaneous  Episiotomy: None  Lacerations:  1st degree;Labial  Patient had a postpartum course has been uncomplicated.  She is ambulating, tolerating a regular diet, passing flatus, and urinating well. Patient is discharged home in stable condition on 04/03/23.  Newborn Data: Birth date:04/02/2023  Birth time:3:18 AM  Gender:Female  Living status:Living  Apgars:8 ,9  Weight:2940 g   Magnesium Sulfate received: No BMZ received: No Rhophylac:N/A Transfusion:No  Physical exam  Vitals:   04/02/23 1430 04/02/23 1828 04/02/23 2051 04/03/23 0535  BP: 122/74 132/74 128/71 105/79  Pulse: 70 74 63 71  Resp: 18 18 18 16   Temp: 98 F (36.7 C) 98 F (36.7 C) 98 F (36.7 C) 98 F (36.7 C)  TempSrc: Oral Oral Oral Oral  SpO2:   100% 100%  Weight:      Height:       General: alert, cooperative, and no distress Lochia: appropriate Uterine Fundus: firm Incision: N/A DVT Evaluation: No evidence of DVT seen on physical exam. No cords or calf tenderness. No significant calf/ankle edema. Labs: Lab Results  Component Value Date   WBC 6.9 04/02/2023    HGB 11.2 (L) 04/02/2023   HCT 33.6 (L) 04/02/2023   MCV 88.4 04/02/2023   PLT 206 04/02/2023      Latest Ref Rng & Units 04/02/2023    2:43 AM  CMP  Glucose 70 - 99 mg/dL 409   BUN 6 - 20 mg/dL 7   Creatinine 8.11 - 9.14 mg/dL 7.82   Sodium 956 - 213 mmol/L 134   Potassium 3.5 - 5.1 mmol/L 3.5   Chloride 98 - 111 mmol/L 98   CO2 22 - 32 mmol/L 19   Calcium 8.9 - 10.3 mg/dL 9.8   Total Protein 6.5 - 8.1 g/dL 7.3   Total Bilirubin 0.3 - 1.2 mg/dL 0.3   Alkaline Phos 38 - 126 U/L 130   AST 15 - 41 U/L 21   ALT 0 - 44 U/L 13    Edinburgh Score:    04/02/2023    8:34 AM  Edinburgh Postnatal Depression Scale Screening Tool  I have been able to laugh and see the funny side of things. 0  I have looked forward with enjoyment to things. 0  I have blamed myself unnecessarily when things went wrong. 0  I have been anxious or worried for no good reason. 0  I have felt scared or panicky for no good reason. 0  Things have been getting on top  of me. 0  I have been so unhappy that I have had difficulty sleeping. 0  I have felt sad or miserable. 0  I have been so unhappy that I have been crying. 0  The thought of harming myself has occurred to me. 0  Edinburgh Postnatal Depression Scale Total 0      After visit meds:  Allergies as of 04/03/2023   No Known Allergies      Medication List     STOP taking these medications    aspirin EC 81 MG tablet   FeroSul 325 (65 FE) MG tablet Generic drug: ferrous sulfate   ferrous sulfate 325 (65 FE) MG tablet       TAKE these medications    certolizumab pegol 2 X 200 MG/ML Pskt prefilled syringe Commonly known as: CIMZIA Inject 200 mg into the skin every 14 (fourteen) days. What changed:  when to take this additional instructions   ibuprofen 600 MG tablet Commonly known as: ADVIL Take 1 tablet (600 mg total) by mouth every 6 (six) hours.   prenatal multivitamin Tabs tablet Take 1 tablet by mouth at bedtime.          Discharge home in stable condition Infant Feeding: Breast Infant Disposition:home with mother Discharge instruction: per After Visit Summary and Postpartum booklet. Activity: Advance as tolerated. Pelvic rest for 6 weeks.  Diet: routine diet Anticipated Birth Control:  Declines contraception Postpartum Appointment:6 weeks Additional Postpartum F/U:  n/a Future Appointments: Future Appointments  Date Time Provider Department Center  05/18/2023  8:50 AM Gearldine Bienenstock, PA-C [facility] None   Follow up Visit:  Follow-up Information     Steva Ready, DO Follow up in 6 week(s).   Specialty: Obstetrics and Gynecology Why: Our office will schedule a 6 week postpartum visit for you. Contact information: [address] [place] [address] [phone]                     04/03/2023 Roma Schanz, CNM

## 2023-04-03 NOTE — Lactation Note (Signed)
This note was copied from a baby's chart. Lactation Consultation Note  Patient Name: Meghan Arnold ZOXWR'U Date: 04/03/2023 Age:38 hours, exp BF  Reason for consult: Follow-up assessment;Term;Infant weight loss (8 % weight loss) Baby latched as LC entered the room. Per mom baby has been breast feeding well.  Out explained the 8 % weight loss .  LC recommended when she goes home and the baby isn't cluster feeding and settled add some post pumping after 3 feedings a day , spoon feed back.  If the F/U weight loss is increased , will probably have to supplement if the milk isn't in yet.   Maternal Data    Feeding Mother's Current Feeding Choice: Breast Milk  LATCH Score Latch:  (baby latched with depth)                  Lactation Tools Discussed/Used    Interventions Interventions: Breast feeding basics reviewed;Education;Hand pump;LC Services brochure  Discharge Discharge Education: Engorgement and breast care Pump: Personal;DEBP;Manual  Consult Status Consult Status: Complete Date: 04/03/23    Meghan Arnold Meghan Arnold 04/03/2023, 1:38 PM

## 2023-04-03 NOTE — Plan of Care (Signed)
Na

## 2023-04-27 ENCOUNTER — Telehealth (HOSPITAL_COMMUNITY): Payer: Self-pay | Admitting: *Deleted

## 2023-04-27 NOTE — Telephone Encounter (Signed)
04/27/2023  Name: Meghan Arnold MRN: [id number] DOB: [birth year]  Reason for Call:  Transition of Care Hospital Discharge Call  Contact Status: Patient Contact Status: Complete  Language assistant needed: Interpreter Mode: Interpreter Not Needed        Follow-Up Questions: Do You Have Any Concerns About Your Health As You Heal From Delivery?: No Do You Have Any Concerns About Your Infants Health?: No  Edinburgh Postnatal Depression Scale:  In the Past 7 Days: I have been able to laugh and see the funny side of things.: As much as I always could I have looked forward with enjoyment to things.: As much as I ever did I have blamed myself unnecessarily when things went wrong.: No, never I have been anxious or worried for no good reason.: No, not at all I have felt scared or panicky for no good reason.: No, not at all Things have been getting on top of me.: No, I have been coping as well as ever I have been so unhappy that I have had difficulty sleeping.: Not at all I have felt sad or miserable.: No, not at all I have been so unhappy that I have been crying.: No, never The thought of harming myself has occurred to me.: Never [name] Postnatal Depression Scale Total: 0  PHQ2-9 Depression Scale:     Discharge Follow-up: Edinburgh score requires follow up?: No Patient was advised of the following resources:: Breastfeeding Support Group, Support Group (declines postpartum group information via email) Did patient express any COVID concerns?: No  Post-discharge interventions: Reviewed Newborn Safe Sleep Practices  Salena Saner, RN 04/27/2023 13:26

## 2023-05-04 NOTE — Progress Notes (Deleted)
Office Visit Note  Patient: Meghan Arnold             Date of Birth: [birth year]           MRN: [id number]             PCP: Barbette Merino, NP Referring: Barbette Merino, NP Visit Date: 05/18/2023 Occupation: @GUAROCC @  Subjective:    History of Present Illness: Meghan HILLIGOSS is a 37 y.o. female with history of seropositive rheumatoid arthritis.  Patient remains on Cimzia 200 mg sq injections q other mth.    CMP updated on 04/01/25. CBC updated on 04/03/23.  TB gold negative on 06/09/22 Discussed the importance of holding cimzia if she develops signs or symptoms of an infection and to resume once the infection has completely cleared.    Activities of Daily Living:  Patient reports morning stiffness for *** {minute/hour:19697}.   Patient {ACTIONS;DENIES/REPORTS:21021675::"Denies"} nocturnal pain.  Difficulty dressing/grooming: {ACTIONS;DENIES/REPORTS:21021675::"Denies"} Difficulty climbing stairs: {ACTIONS;DENIES/REPORTS:21021675::"Denies"} Difficulty getting out of chair: {ACTIONS;DENIES/REPORTS:21021675::"Denies"} Difficulty using hands for taps, buttons, cutlery, and/or writing: {ACTIONS;DENIES/REPORTS:21021675::"Denies"}  No Rheumatology ROS completed.   PMFS History:  Patient Active Problem List   Diagnosis Date Noted   Normal labor 04/02/2023   Encounter for induction of labor 11/05/2020   High risk medication use 12/03/2017   Raynaud's disease without gangrene 12/03/2017   Vitamin D deficiency 12/03/2017   Infertility counseling 07/02/2017   Fertility testing 05/04/2017   Seropositive rheumatoid arthritis (HCC) 11/17/2016   Arthralgia of multiple joints 11/17/2016   CRP elevated 11/17/2016   Elevated erythrocyte sedimentation rate 11/17/2016   Raynaud's phenomenon without gangrene 11/17/2016    Past Medical History:  Diagnosis Date   Benign tumor of ovary 2013   Chlamydia    Family history of breast cancer    Raynaud's phenomenon without gangrene    mgmt  rheum Dr Tresa Moore   Rheumatoid arthritis (HCC) 2017   Wears contact lenses     Family History  Problem Relation Age of Onset   Colon cancer Maternal Grandfather    Breast cancer Maternal Grandmother 72   Breast cancer Other 6   Breast cancer Cousin 57   Healthy Son    Past Surgical History:  Procedure Laterality Date   LAPAROSCOPIC SALPINGOOPHERECTOMY  2013   done by dr. April Manson , believes right side laterality    LAPAROSCOPIC UNILATERAL SALPINGECTOMY Right 12/01/2017   Procedure: LAPAROSCOPIC LYSIS OF ADHESIONS, RIGHT TUBAL OVARIOLYSIS, RIGHT OVARIAN CYST ASPIRATION,CHROMOPERTUBATION, RIGHT FIMBRIOPLASTY;  Surgeon: Fermin Schwab, MD;  Location: [facility];  Service: Gynecology;  Laterality: Right;   LAPAROSCOPY     OVARIAN CYST REMOVAL  08/2012   dr Jean Rosenthal at [facility] obgyn in [place] ; has abdominal insision    Social History   Social History Narrative   Not on file   Immunization History  Administered Date(s) Administered   Tdap 08/11/2016     Objective: Vital Signs: There were no vitals taken for this visit.   Physical Exam Vitals and nursing note reviewed.  Constitutional:      Appearance: She is well-developed.  HENT:     Head: Normocephalic and atraumatic.  Eyes:     Conjunctiva/sclera: Conjunctivae normal.  Cardiovascular:     Rate and Rhythm: Normal rate and regular rhythm.     Heart sounds: Normal heart sounds.  Pulmonary:     Effort: Pulmonary effort is normal.     Breath sounds: Normal breath sounds.  Abdominal:     General: Bowel  sounds are normal.     Palpations: Abdomen is soft.  Musculoskeletal:     Cervical back: Normal range of motion.  Lymphadenopathy:     Cervical: No cervical adenopathy.  Skin:    General: Skin is warm and dry.     Capillary Refill: Capillary refill takes less than 2 seconds.  Neurological:     Mental Status: She is alert and oriented to person, place, and time.  Psychiatric:         Behavior: Behavior normal.      Musculoskeletal Exam: ***  CDAI Exam: CDAI Score: -- Patient Global: --; Provider Global: -- Swollen: --; Tender: -- Joint Exam 05/18/2023   No joint exam has been documented for this visit   There is currently no information documented on the homunculus. Go to the Rheumatology activity and complete the homunculus joint exam.  Investigation: No additional findings.  Imaging: No results found.  Recent Labs: Lab Results  Component Value Date   WBC 8.7 04/03/2023   HGB 11.2 (L) 04/03/2023   PLT 245 04/03/2023   NA 134 (L) 04/02/2023   K 3.5 04/02/2023   CL 98 04/02/2023   CO2 19 (L) 04/02/2023   GLUCOSE 106 (H) 04/02/2023   BUN 7 04/02/2023   CREATININE 0.77 04/02/2023   BILITOT 0.3 04/02/2023   ALKPHOS 130 (H) 04/02/2023   AST 21 04/02/2023   ALT 13 04/02/2023   PROT 7.3 04/02/2023   ALBUMIN 3.2 (L) 04/02/2023   CALCIUM 9.8 04/02/2023   GFRAA 125 03/11/2021   QFTBGOLDPLUS NEGATIVE 06/09/2022    Speciality Comments: Cimzia 06/19- restarted December 06, 2020  Procedures:  No procedures performed Allergies: Patient has no known allergies.   Assessment / Plan:     Visit Diagnoses: Rheumatoid arthritis involving multiple sites with positive rheumatoid factor (HCC)  High risk medication use  Raynaud's disease without gangrene  Vitamin D deficiency  Rash  Elevated blood pressure reading  Orders: No orders of the defined types were placed in this encounter.  No orders of the defined types were placed in this encounter.   Face-to-face time spent with patient was *** minutes. Greater than 50% of time was spent in counseling and coordination of care.  Follow-Up Instructions: No follow-ups on file.   Gearldine Bienenstock, PA-C  Note - This record has been created using Dragon software.  Chart creation errors have been sought, but may not always  have been located. Such creation errors do not reflect on  the standard of medical  care.

## 2023-05-18 ENCOUNTER — Ambulatory Visit: Payer: Medicaid Other | Admitting: Physician Assistant

## 2023-05-18 DIAGNOSIS — Z79899 Other long term (current) drug therapy: Secondary | ICD-10-CM

## 2023-05-18 DIAGNOSIS — R21 Rash and other nonspecific skin eruption: Secondary | ICD-10-CM

## 2023-05-18 DIAGNOSIS — I7300 Raynaud's syndrome without gangrene: Secondary | ICD-10-CM

## 2023-05-18 DIAGNOSIS — M0579 Rheumatoid arthritis with rheumatoid factor of multiple sites without organ or systems involvement: Secondary | ICD-10-CM

## 2023-05-18 DIAGNOSIS — R030 Elevated blood-pressure reading, without diagnosis of hypertension: Secondary | ICD-10-CM

## 2023-05-18 DIAGNOSIS — E559 Vitamin D deficiency, unspecified: Secondary | ICD-10-CM

## 2023-05-27 NOTE — Progress Notes (Unsigned)
Office Visit Note  Patient: Meghan Arnold             Date of Birth: [birth year]           MRN: [id number]             PCP: Barbette Merino, NP Referring: Barbette Merino, NP Visit Date: 06/10/2023 Occupation: @GUAROCC @  Subjective:  Patient monitoring  History of Present Illness: Meghan Arnold is a 37 y.o. female with history of seropositive rheumatoid arthritis.  Patient remains on Cimzia 200 mg sq injections every 2 weeks.  Patient states that she delivered a healthy baby girl on 04/02/2023.  She states she is currently breast-feeding.  She resumed Cimzia 200 mg subcu days injections every 2 weeks about 1-1/2 months ago.  During her pregnancy she had been spacing Cimzia to 200 mg every other month.  She denies any increased joint pain or joint swelling at this time.  She is not experiencing any morning stiffness or nocturnal pain.  She denies any difficulty with ADLs.  She denies any new medical conditions.  She denies any recent or recurrent infections.   Activities of Daily Living:  Patient reports morning stiffness for 0 minute.   Patient Denies nocturnal pain.  Difficulty dressing/grooming: Denies Difficulty climbing stairs: Denies Difficulty getting out of chair: Denies Difficulty using hands for taps, buttons, cutlery, and/or writing: Denies  Review of Systems  Constitutional:  Negative for fatigue.  HENT:  Negative for mouth sores and mouth dryness.   Eyes:  Negative for dryness.  Respiratory:  Negative for shortness of breath.   Cardiovascular:  Negative for chest pain and palpitations.  Gastrointestinal:  Negative for blood in stool, constipation and diarrhea.  Endocrine: Negative for increased urination.  Genitourinary:  Negative for involuntary urination.  Musculoskeletal:  Negative for joint pain, gait problem, joint pain, joint swelling, myalgias, muscle weakness, morning stiffness, muscle tenderness and myalgias.  Skin:  Negative for color change, rash, hair  loss and sensitivity to sunlight.  Allergic/Immunologic: Negative for susceptible to infections.  Neurological:  Negative for dizziness and headaches.  Hematological:  Negative for swollen glands.  Psychiatric/Behavioral:  Negative for depressed mood and sleep disturbance. The patient is not nervous/anxious.     PMFS History:  Patient Active Problem List   Diagnosis Date Noted   Normal labor 04/02/2023   Encounter for induction of labor 11/05/2020   High risk medication use 12/03/2017   Raynaud's disease without gangrene 12/03/2017   Vitamin D deficiency 12/03/2017   Infertility counseling 07/02/2017   Fertility testing 05/04/2017   Seropositive rheumatoid arthritis (HCC) 11/17/2016   Arthralgia of multiple joints 11/17/2016   CRP elevated 11/17/2016   Elevated erythrocyte sedimentation rate 11/17/2016   Raynaud's phenomenon without gangrene 11/17/2016    Past Medical History:  Diagnosis Date   Benign tumor of ovary 2013   Chlamydia    Family history of breast cancer    Raynaud's phenomenon without gangrene    mgmt rheum Dr Tresa Moore   Rheumatoid arthritis (HCC) 2017   Wears contact lenses     Family History  Problem Relation Age of Onset   Breast cancer Maternal Grandmother 72   Colon cancer Maternal Grandfather    Healthy Son    Breast cancer Cousin 7   Breast cancer Other 59   Healthy Daughter    Past Surgical History:  Procedure Laterality Date   LAPAROSCOPIC SALPINGOOPHERECTOMY  2013   done by dr. April Manson , believes right side  laterality    LAPAROSCOPIC UNILATERAL SALPINGECTOMY Right 12/01/2017   Procedure: LAPAROSCOPIC LYSIS OF ADHESIONS, RIGHT TUBAL OVARIOLYSIS, RIGHT OVARIAN CYST ASPIRATION,CHROMOPERTUBATION, RIGHT FIMBRIOPLASTY;  Surgeon: Fermin Schwab, MD;  Location: [facility];  Service: Gynecology;  Laterality: Right;   LAPAROSCOPY     OVARIAN CYST REMOVAL  08/2012   dr Jean Rosenthal at [facility] obgyn in [place] ; has abdominal  insision    Social History   Social History Narrative   Not on file   Immunization History  Administered Date(s) Administered   Tdap 08/11/2016     Objective: Vital Signs: BP (!) 143/84 (BP Location: Left Arm, Patient Position: Sitting, Cuff Size: Normal)   Pulse 66   Resp 14   Ht 5\' 5"  (1.651 m)   Wt 162 lb (73.5 kg)   Breastfeeding Yes   BMI 26.96 kg/m    Physical Exam Vitals and nursing note reviewed.  Constitutional:      Appearance: She is well-developed.  HENT:     Head: Normocephalic and atraumatic.  Eyes:     Conjunctiva/sclera: Conjunctivae normal.  Cardiovascular:     Rate and Rhythm: Normal rate and regular rhythm.     Heart sounds: Normal heart sounds.  Pulmonary:     Effort: Pulmonary effort is normal.     Breath sounds: Normal breath sounds.  Abdominal:     General: Bowel sounds are normal.     Palpations: Abdomen is soft.  Musculoskeletal:     Cervical back: Normal range of motion.  Skin:    General: Skin is warm and dry.     Capillary Refill: Capillary refill takes less than 2 seconds.     Comments: Eczema overlying dorsal aspect of both hands and wrists   Neurological:     Mental Status: She is alert and oriented to person, place, and time.  Psychiatric:        Behavior: Behavior normal.      Musculoskeletal Exam: C-spine, thoracic spine, lumbar spine good range of motion.  Shoulder joints, elbow joints, wrist joints, MCPs, PIPs, DIPs have good range of motion with no synovitis.  Complete fist formation bilaterally.  Hip joints have good range of motion with no groin pain.  Knee joints have good range of motion with no warmth or effusion.  Ankle joints have good range of motion with no tenderness or joint swelling.  CDAI Exam: CDAI Score: -- Patient Global: 0 / 100; Provider Global: 0 / 100 Swollen: --; Tender: -- Joint Exam 06/10/2023   No joint exam has been documented for this visit   There is currently no information documented on the  homunculus. Go to the Rheumatology activity and complete the homunculus joint exam.  Investigation: No additional findings.  Imaging: No results found.  Recent Labs: Lab Results  Component Value Date   WBC 8.7 04/03/2023   HGB 11.2 (L) 04/03/2023   PLT 245 04/03/2023   NA 134 (L) 04/02/2023   K 3.5 04/02/2023   CL 98 04/02/2023   CO2 19 (L) 04/02/2023   GLUCOSE 106 (H) 04/02/2023   BUN 7 04/02/2023   CREATININE 0.77 04/02/2023   BILITOT 0.3 04/02/2023   ALKPHOS 130 (H) 04/02/2023   AST 21 04/02/2023   ALT 13 04/02/2023   PROT 7.3 04/02/2023   ALBUMIN 3.2 (L) 04/02/2023   CALCIUM 9.8 04/02/2023   GFRAA 125 03/11/2021   QFTBGOLDPLUS NEGATIVE 06/09/2022    Speciality Comments: Cimzia 06/19- restarted December 06, 2020  Procedures:  No procedures  performed Allergies: Patient has no known allergies.   Assessment / Plan:     Visit Diagnoses: Rheumatoid arthritis involving multiple sites with positive rheumatoid factor (HCC): She has no joint tenderness or synovitis on examination today.  She has not had any signs or symptoms of a flare.  No nocturnal pain, morning stiffness, or difficulty with ADLs.  She is currently on Cimzia 200 mg subcu days injections every 2 weeks.  On 04/02/2023 she gave birth to a healthy baby girl named Ava.  She is currently breast-feeding.  During most of her pregnancy she was spacing Cimzia to 200 mg every other month but for the past 1-1/2 months she has resumed Cimzia as prescribed.  Refill of Cimzia will be sent to the pharmacy today.  Patient was advised notify us if she develops signs or symptoms of a flare.  She will follow-up in the office in 5 months or sooner if needed.  High risk medication use - CMP updated on 04/02/23. CBC updated on 04/03/23.  Her next apical be due in October and every 3 months to monitor for drug toxicity. TB gold negative on 06/09/22. Future order for TB gold placed today. No recent or recurrent infections.  Discussed the  importance of holding cimzia if she develops signs or symptoms of an infection and to resume once the infection has completely cleared. Plan: QuantiFERON-TB Gold Plus  Screening for tuberculosis - Order for TB gold released today.  Plan: QuantiFERON-TB Gold Plus  Raynaud's disease without gangrene: Currently symptomatic.  Eczema of both hands: Patient is currently having a flare of eczema involving both hands.  She has been using over-the-counter CeraVe a with no improvement in her symptoms.  Patient requested a referral to dermatology which will be placed today.  Orders: Orders Placed This Encounter  Procedures   QuantiFERON-TB Gold Plus   No orders of the defined types were placed in this encounter.   Follow-Up Instructions: Return in about 5 months (around 11/10/2023) for Rheumatoid arthritis.   Gearldine Bienenstock, PA-C  Note - This record has been created using Dragon software.  Chart creation errors have been sought, but may not always  have been located. Such creation errors do not reflect on  the standard of medical care.

## 2023-06-10 ENCOUNTER — Ambulatory Visit: Payer: Medicaid Other | Attending: Physician Assistant | Admitting: Physician Assistant

## 2023-06-10 ENCOUNTER — Other Ambulatory Visit: Payer: Self-pay

## 2023-06-10 ENCOUNTER — Encounter: Payer: Self-pay | Admitting: Physician Assistant

## 2023-06-10 VITALS — BP 143/84 | HR 66 | Resp 14 | Ht 65.0 in | Wt 162.0 lb

## 2023-06-10 DIAGNOSIS — I7300 Raynaud's syndrome without gangrene: Secondary | ICD-10-CM | POA: Diagnosis not present

## 2023-06-10 DIAGNOSIS — Z79899 Other long term (current) drug therapy: Secondary | ICD-10-CM

## 2023-06-10 DIAGNOSIS — E559 Vitamin D deficiency, unspecified: Secondary | ICD-10-CM

## 2023-06-10 DIAGNOSIS — R21 Rash and other nonspecific skin eruption: Secondary | ICD-10-CM

## 2023-06-10 DIAGNOSIS — Z111 Encounter for screening for respiratory tuberculosis: Secondary | ICD-10-CM

## 2023-06-10 DIAGNOSIS — M0579 Rheumatoid arthritis with rheumatoid factor of multiple sites without organ or systems involvement: Secondary | ICD-10-CM | POA: Diagnosis not present

## 2023-06-10 DIAGNOSIS — L309 Dermatitis, unspecified: Secondary | ICD-10-CM

## 2023-06-10 MED ORDER — CERTOLIZUMAB PEGOL 200 MG/ML ~~LOC~~ PSKT: PREFILLED_SYRINGE | SUBCUTANEOUS | 0 refills | Status: DC

## 2023-06-10 NOTE — Telephone Encounter (Signed)
Please review prescription and send to the pharmacy. Thanks!

## 2023-06-10 NOTE — Addendum Note (Signed)
Addended by: Ellen Henri on: 06/10/2023 04:27 PM   Modules accepted: Orders

## 2023-06-13 LAB — QUANTIFERON-TB GOLD PLUS
Mitogen-NIL: 4.19 [IU]/mL
NIL: 0.02 [IU]/mL
QuantiFERON-TB Gold Plus: NEGATIVE
TB1-NIL: 0 [IU]/mL
TB2-NIL: 0 [IU]/mL

## 2023-06-14 NOTE — Progress Notes (Signed)
TB gold negative

## 2023-06-15 ENCOUNTER — Telehealth: Payer: Self-pay | Admitting: Pharmacist

## 2023-06-15 DIAGNOSIS — M0579 Rheumatoid arthritis with rheumatoid factor of multiple sites without organ or systems involvement: Secondary | ICD-10-CM

## 2023-06-15 DIAGNOSIS — Z79899 Other long term (current) drug therapy: Secondary | ICD-10-CM

## 2023-06-15 MED ORDER — CERTOLIZUMAB PEGOL 200 MG/ML ~~LOC~~ PSKT
PREFILLED_SYRINGE | SUBCUTANEOUS | 0 refills | Status: DC
Start: 2023-06-15 — End: 2023-10-19

## 2023-06-15 NOTE — Telephone Encounter (Addendum)
Called [facility]. Per rep, they are no longer able fill Cimzia as of 2023. Rx should have been sent to [facility]  Called [facility] to determine fill history. Per rep, it was last filled 06/2022. Patient was spacing to once a month while pregnant. Rx sent to pharmacy.  Chesley Mires, PharmD, MPH, BCPS, CPP Clinical Pharmacist (Rheumatology and Pulmonology)  ----- Message from Elmyra Ricks sent at 06/11/2023  9:56 AM EDT ----- Regarding: CIMZIA CANNOT BE FILLED [facility] called and stated they cannot refill patient's Cimzia. Please advise. Thank you.

## 2023-08-09 ENCOUNTER — Ambulatory Visit: Payer: Medicaid Other | Admitting: Podiatry

## 2023-08-09 ENCOUNTER — Encounter: Payer: Self-pay | Admitting: Podiatry

## 2023-08-09 ENCOUNTER — Ambulatory Visit: Payer: Medicaid Other

## 2023-08-09 VITALS — Ht 65.0 in | Wt 162.0 lb

## 2023-08-09 DIAGNOSIS — M21611 Bunion of right foot: Secondary | ICD-10-CM

## 2023-08-09 DIAGNOSIS — M2011 Hallux valgus (acquired), right foot: Secondary | ICD-10-CM | POA: Diagnosis not present

## 2023-08-09 DIAGNOSIS — M21619 Bunion of unspecified foot: Secondary | ICD-10-CM

## 2023-08-09 DIAGNOSIS — M21612 Bunion of left foot: Secondary | ICD-10-CM | POA: Diagnosis not present

## 2023-08-09 NOTE — Progress Notes (Signed)
Chief Complaint  Patient presents with   Bunions    Pt bilateral bunion pain about 6-7 months ago, overall painful especially when wearing tennis shoes.    Subjective: 37 y.o. female presents today as a new patient for evaluation of symptomatic bunions to the bilateral feet right greater than the left.  She says the bunions actually run in her family.  Her mother just had surgery last week performed by myself for bunions.  Patient states that she has had pain and tenderness for several years but it has been exacerbated over the last 6 to 7 months.  It is very painful and tender despite different shoe gear modifications.  She presents for further treatment and evaluation  Past Medical History:  Diagnosis Date   Benign tumor of ovary 2013   Chlamydia    Family history of breast cancer    Raynaud's phenomenon without gangrene    mgmt rheum Dr Tresa Moore   Rheumatoid arthritis [facility]) 2017   Wears contact lenses     Past Surgical History:  Procedure Laterality Date   LAPAROSCOPIC SALPINGOOPHERECTOMY  2013   done by dr. April Manson , believes right side laterality    LAPAROSCOPIC UNILATERAL SALPINGECTOMY Right 12/01/2017   Procedure: LAPAROSCOPIC LYSIS OF ADHESIONS, RIGHT TUBAL OVARIOLYSIS, RIGHT OVARIAN CYST ASPIRATION,CHROMOPERTUBATION, RIGHT FIMBRIOPLASTY;  Surgeon: Fermin Schwab, MD;  Location: [facility];  Service: Gynecology;  Laterality: Right;   LAPAROSCOPY     OVARIAN CYST REMOVAL  08/2012   dr Jean Rosenthal at [facility] in [place] ; has abdominal insision     No Known Allergies   Objective: Physical Exam General: The patient is alert and oriented x3 in no acute distress.  Dermatology: Skin is cool, dry and supple bilateral lower extremities. Negative for open lesions or macerations.  Vascular: Palpable pedal pulses bilaterally. No edema or erythema noted. Capillary refill within normal limits.  Neurological: Grossly intact via light  touch  Musculoskeletal Exam: Clinical evidence of bunion deformity noted to the respective foot. There is moderate pain on palpation range of motion of the first MPJ. Lateral deviation of the hallux noted consistent with hallux abductovalgus. Collapse of the medial longitudinal arch of the foot also noted with weightbearing bilateral consistent with pes planovalgus deformity  Radiographic Exam B/L feet 08/09/2023: Normal osseous mineralization.  No acute fractures identified.  Increased intermetatarsal angle with an increased hallux abductus angle on AP view.   Assessment: 1.  Hallux valgus bilateral.  RT > LT 2.  Pes planovalgus deformity bilateral   Plan of Care:  -Patient was evaluated. X-Rays reviewed. -In regards to the flatfoot deformity, the patient only notices intermittent achiness occasionally.  Recommend OTC prefabricated arch supports to support the medial longitudinal arch of the foot.  They were provided today -Today we had a discussion about pathology and etiology of bunion deformity.  Unfortunately the patient has tried multiple conservative modalities including oral anti-inflammatories and shoe gear modifications to alleviate the pain but she continues to have pain and tenderness especially in the last 6 to 7 months despite these conservative modalities -I do believe the patient would benefit from corrective bunion surgery.  Risk benefits advantages and disadvantages of the procedure were explained in detail to the patient.  No guarantees were expressed or implied.  All patient questions answered. -Authorization for surgery was initiated today.  Surgery will consist of bunionectomy with osteotomy right foot -Return to clinic 1 week postop  *Cosmetologist. Can take the time off necessary postop  Felecia Shelling, DPM [facility]  Dr. Felecia Shelling, DPM    [address] [place], [place] [address]                Office  [phone]  Fax [phone]

## 2023-08-19 ENCOUNTER — Other Ambulatory Visit: Payer: Self-pay | Admitting: Physician Assistant

## 2023-08-19 DIAGNOSIS — M0579 Rheumatoid arthritis with rheumatoid factor of multiple sites without organ or systems involvement: Secondary | ICD-10-CM

## 2023-08-19 DIAGNOSIS — Z79899 Other long term (current) drug therapy: Secondary | ICD-10-CM

## 2023-08-30 ENCOUNTER — Telehealth: Payer: Self-pay | Admitting: Podiatry

## 2023-08-30 NOTE — Telephone Encounter (Signed)
DOS- 09/30/2023  Meghan Arnold [id number]  MEDICAID HEALTHY BLUE EFFECTIVE DATE-  11/26/2021  SPOKE WITH LORI G. FROM MEDICAID [employer]ALTH[employer] AND SHE STATED THAT FOR CPT CODE 02725, PRIOR AUTH IS NOT REQUIRED.  CALL REFERENCE NUMBER: I - [id number]

## 2023-09-30 ENCOUNTER — Telehealth: Payer: Self-pay | Admitting: Podiatry

## 2023-09-30 NOTE — Telephone Encounter (Signed)
 Pt called to see when she could reschedule her surgery that was for 09/30/23. It was supposed to be at the surgery center but needed to be moved to the hospital. Pt states that she was told someone would call her today to figure out the next available [facility] day.  The best number to reach her is [phone].

## 2023-10-01 ENCOUNTER — Telehealth: Payer: Self-pay | Admitting: Podiatry

## 2023-10-01 NOTE — Telephone Encounter (Signed)
 DOS- 10/28/23  Meghan Arnold [phone]  HEALTHY BLUE EFFECTIVE DATE-11/26/21  SPOKE WITH TRINITY Y. FROM [employer]ALTH[employer] AND SHE STATED THAT PRIOR AUTH IS NOT REQUIRED FOR CPT CODE 13086.  CALL REF #Charline Bills - [id number]

## 2023-10-06 ENCOUNTER — Encounter: Payer: Medicaid Other | Admitting: Podiatry

## 2023-10-07 ENCOUNTER — Encounter: Payer: Self-pay | Admitting: *Deleted

## 2023-10-13 ENCOUNTER — Encounter: Payer: Medicaid Other | Admitting: Podiatry

## 2023-10-14 ENCOUNTER — Other Ambulatory Visit: Payer: Self-pay | Admitting: *Deleted

## 2023-10-14 DIAGNOSIS — Z79899 Other long term (current) drug therapy: Secondary | ICD-10-CM

## 2023-10-15 LAB — CBC WITH DIFFERENTIAL/PLATELET
Absolute Lymphocytes: 1890 {cells}/uL (ref 850–3900)
Absolute Monocytes: 356 {cells}/uL (ref 200–950)
Basophils Absolute: 68 {cells}/uL (ref 0–200)
Basophils Relative: 1.9 %
Eosinophils Absolute: 277 {cells}/uL (ref 15–500)
Eosinophils Relative: 7.7 %
HCT: 37.3 % (ref 35.0–45.0)
Hemoglobin: 12.2 g/dL (ref 11.7–15.5)
MCH: 29.9 pg (ref 27.0–33.0)
MCHC: 32.7 g/dL (ref 32.0–36.0)
MCV: 91.4 fL (ref 80.0–100.0)
MPV: 9.2 fL (ref 7.5–12.5)
Monocytes Relative: 9.9 %
Neutro Abs: 1008 {cells}/uL — ABNORMAL LOW (ref 1500–7800)
Neutrophils Relative %: 28 %
Platelets: 313 10*3/uL (ref 140–400)
RBC: 4.08 10*6/uL (ref 3.80–5.10)
RDW: 11.8 % (ref 11.0–15.0)
Total Lymphocyte: 52.5 %
WBC: 3.6 10*3/uL — ABNORMAL LOW (ref 3.8–10.8)

## 2023-10-15 LAB — LIPID PANEL
Cholesterol: 214 mg/dL — ABNORMAL HIGH (ref <200)
HDL: 81 mg/dL (ref >=50)
LDL Cholesterol (Calc): 121 mg/dL — ABNORMAL HIGH
Non-HDL Cholesterol (Calc): 133 mg/dL — ABNORMAL HIGH (ref <130)
Total CHOL/HDL Ratio: 2.6 (calc) (ref <5.0)
Triglycerides: 41 mg/dL (ref <150)

## 2023-10-15 LAB — COMPLETE METABOLIC PANEL WITH GFR
AG Ratio: 1.4 (calc) (ref 1.0–2.5)
ALT: 10 U/L (ref 6–29)
AST: 13 U/L (ref 10–30)
Albumin: 4.4 g/dL (ref 3.6–5.1)
Alkaline phosphatase (APISO): 54 U/L (ref 31–125)
BUN: 13 mg/dL (ref 7–25)
CO2: 27 mmol/L (ref 20–32)
Calcium: 9.3 mg/dL (ref 8.6–10.2)
Chloride: 102 mmol/L (ref 98–110)
Creat: 0.73 mg/dL (ref 0.50–0.97)
Globulin: 3.2 g/dL (ref 1.9–3.7)
Glucose, Bld: 73 mg/dL (ref 65–99)
Potassium: 4.4 mmol/L (ref 3.5–5.3)
Sodium: 139 mmol/L (ref 135–146)
Total Bilirubin: 0.5 mg/dL (ref 0.2–1.2)
Total Protein: 7.6 g/dL (ref 6.1–8.1)
eGFR: 108 mL/min/{1.73_m2} (ref >=60)

## 2023-10-15 NOTE — Progress Notes (Signed)
LDL is elevated.  White cell count is mildly decreased at 3.6 due to immunosuppression.  CMP is normal.  Forward results to her PCP.

## 2023-10-19 ENCOUNTER — Other Ambulatory Visit: Payer: Self-pay | Admitting: *Deleted

## 2023-10-19 DIAGNOSIS — M0579 Rheumatoid arthritis with rheumatoid factor of multiple sites without organ or systems involvement: Secondary | ICD-10-CM

## 2023-10-19 DIAGNOSIS — Z79899 Other long term (current) drug therapy: Secondary | ICD-10-CM

## 2023-10-19 MED ORDER — CERTOLIZUMAB PEGOL 200 MG/ML ~~LOC~~ PSKT
PREFILLED_SYRINGE | SUBCUTANEOUS | 0 refills | Status: DC
Start: 2023-10-19 — End: 2023-12-29

## 2023-10-19 NOTE — Telephone Encounter (Signed)
Patient requested refill on Cimzia to be sent to [facility].  Last Fill: 06/15/2023  Labs: 10/14/2023 LDL is elevated.  White cell count is mildly decreased at 3.6 due to immunosuppression.  CMP is normal.   TB Gold: 06/10/2023 Neg    Next Visit: 11/30/2023  Last Visit: 06/10/2023  [id number] arthritis involving multiple sites with positive rheumatoid factor   Current Dose per office note 06/10/2023: Cimzia 200 mg subcu days injections every 2 weeks.   Okay to refill Cimzia?

## 2023-10-20 ENCOUNTER — Inpatient Hospital Stay: Admission: RE | Admit: 2023-10-20 | Payer: Medicaid Other | Source: Ambulatory Visit

## 2023-10-27 ENCOUNTER — Encounter: Payer: Medicaid Other | Admitting: Podiatry

## 2023-11-03 ENCOUNTER — Encounter: Payer: Medicaid Other | Admitting: Podiatry

## 2023-11-10 ENCOUNTER — Encounter: Payer: Medicaid Other | Admitting: Podiatry

## 2023-11-16 ENCOUNTER — Ambulatory Visit: Payer: Medicaid Other | Admitting: Dermatology

## 2023-11-16 ENCOUNTER — Encounter: Payer: Self-pay | Admitting: Dermatology

## 2023-11-16 VITALS — BP 143/94

## 2023-11-16 DIAGNOSIS — L309 Dermatitis, unspecified: Secondary | ICD-10-CM

## 2023-11-16 MED ORDER — TACROLIMUS 0.1 % EX OINT
TOPICAL_OINTMENT | Freq: Two times a day (BID) | CUTANEOUS | 1 refills | Status: DC
Start: 2023-11-16 — End: 2023-11-30

## 2023-11-16 NOTE — Progress Notes (Signed)
   New Patient Visit   Subjective  Meghan Arnold is a 38 y.o. female who presents for the following: New Pt - Hand Dermatitis  Patient states she has hand dermatitis located at the hands that she would like to have examined. Patient reports the areas have been there for 3 years. She reports the areas are bothersome.Patient rates irritation (constant itching)10 out of 10. She states that the areas have not spread. Patient reports she has previously been treated for these areas by PCP. Rx clobetasol ointment (unsure of strength) that she mixes with Vaseline and/or shea butter to apply, however, sometimes it helps with calming down the flare. Patient denies Hx of bx. Patient denies family history of skin cancer(s).  The following portions of the chart were reviewed this encounter and updated as appropriate: medications, allergies, medical history  Review of Systems:  No other skin or systemic complaints except as noted in HPI or Assessment and Plan.  Objective  Well appearing patient in no apparent distress; mood and affect are within normal limits.   A focused examination was performed of the following areas: hands   Relevant exam findings are noted in the Assessment and Plan.    Assessment & Plan   HAND DERMATITIS Exam Scaly pink plaques +/- fissures  Not at goal  Hand Dermatitis is a chronic type of eczema that can come and go on the hands and fingers.  While there is no cure, the rash and symptoms can be managed with topical prescription medications, and for more severe cases, with systemic medications.  Recommend mild soap and routine use of moisturizing cream after handwashing.  Minimize soap/water exposure when possible.    Treatment Plan - Encouraged pt to wear gloves while cleaning or handling any types of chemicals to prevent irritation of hands - Rx Tacrolimus 0.1% oint - Use BID for 2 week then alternate with clobetasol every 2 weeks until clear. Repeat for flared  PRN. - Discussed starting Dupixent at next visit if topicals doesn't control her Sx as the hands are a special site that are used daily.      No follow-ups on file.    Documentation: I have reviewed the above documentation for accuracy and completeness, and I agree with the above.   I, Shirron Marcha Solders, CMA, am acting as scribe for [employer], DO.   Langston Reusing, DO

## 2023-11-16 NOTE — Patient Instructions (Addendum)

## 2023-11-16 NOTE — Progress Notes (Signed)
 Office Visit Note  Patient: Meghan Arnold             Date of Birth: 10/20/1985           MRN: [id number]             PCP: Barbette Merino, NP Referring: Barbette Merino, NP Visit Date: 11/30/2023 Occupation: @GUAROCC @  Subjective:  Medication management  History of Present Illness: Meghan Arnold is a 38 y.o. female with seropositive rheumatoid arthritis.  She has been on Cimzia 200 mg subcu every 2 weeks.  She was off Cimzia during her pregnancy last year.  She has been taking Cimzia on a regular basis since August 2024.  She has been having pain and discomfort in her right foot due to bunion.  She has been under care of Dr. Logan Bores.  She is a scheduled to have right bunionectomy on December 08, 2022.    Activities of Daily Living:  Patient reports morning stiffness for 0 minutes.   Patient Denies nocturnal pain.  Difficulty dressing/grooming: Denies Difficulty climbing stairs: Denies Difficulty getting out of chair: Denies Difficulty using hands for taps, buttons, cutlery, and/or writing: Denies  Review of Systems  Constitutional:  Negative for fatigue.  HENT:  Negative for mouth sores, mouth dryness and nose dryness.   Eyes:  Negative for pain and dryness.  Respiratory:  Negative for shortness of breath and difficulty breathing.   Cardiovascular:  Negative for chest pain and palpitations.  Gastrointestinal:  Negative for blood in stool, constipation and diarrhea.  Endocrine: Negative for increased urination.  Genitourinary:  Negative for involuntary urination.  Musculoskeletal:  Negative for joint pain, gait problem, joint pain, joint swelling, myalgias, muscle weakness, morning stiffness, muscle tenderness and myalgias.  Skin:  Negative for color change, rash, hair loss and sensitivity to sunlight.  Allergic/Immunologic: Negative for susceptible to infections.  Neurological:  Negative for dizziness and headaches.  Hematological:  Negative for swollen glands.   Psychiatric/Behavioral:  Negative for depressed mood and sleep disturbance. The patient is not nervous/anxious.     PMFS History:  Patient Active Problem List   Diagnosis Date Noted   Normal labor 04/02/2023   Encounter for induction of labor 11/05/2020   High risk medication use 12/03/2017   Raynaud's disease without gangrene 12/03/2017   Vitamin D deficiency 12/03/2017   Infertility counseling 07/02/2017   Fertility testing 05/04/2017   Seropositive rheumatoid arthritis (HCC) 11/17/2016   Arthralgia of multiple joints 11/17/2016   CRP elevated 11/17/2016   Elevated erythrocyte sedimentation rate 11/17/2016   Raynaud's phenomenon without gangrene 11/17/2016    Past Medical History:  Diagnosis Date   Benign tumor of ovary 2013   Chlamydia    Family history of breast cancer    Raynaud's phenomenon without gangrene    mgmt rheum Dr Tresa Moore   Rheumatoid arthritis (HCC) 2017   Wears contact lenses     Family History  Problem Relation Age of Onset   Breast cancer Maternal Grandmother 72   Colon cancer Maternal Grandfather    Healthy Son    Breast cancer Cousin 35   Breast cancer Other 77   Healthy Daughter    Past Surgical History:  Procedure Laterality Date   LAPAROSCOPIC SALPINGOOPHERECTOMY  2013   done by dr. April Manson , believes right side laterality    LAPAROSCOPIC UNILATERAL SALPINGECTOMY Right 12/01/2017   Procedure: LAPAROSCOPIC LYSIS OF ADHESIONS, RIGHT TUBAL OVARIOLYSIS, RIGHT OVARIAN CYST ASPIRATION,CHROMOPERTUBATION, RIGHT FIMBRIOPLASTY;  Surgeon: Fermin Schwab, MD;  Location: [facility];  Service: Gynecology;  Laterality: Right;   LAPAROSCOPY     OVARIAN CYST REMOVAL  08/2012   dr Jean Rosenthal at [facility] ob[facility] in [place] ; has abdominal insision    Social History   Social History Narrative   Not on file   Immunization History  Administered Date(s) Administered   MMR 01/09/2020   Tdap 08/11/2016, 09/16/2020      Objective: Vital Signs: BP 124/82 (BP Location: Left Arm, Patient Position: Sitting, Cuff Size: Normal)   Pulse 74   Resp 15   Ht 5\' 5"  (1.651 m)   Wt 157 lb 9.6 oz (71.5 kg)   LMP 11/29/2023 (Exact Date)   BMI 26.23 kg/m    Physical Exam Vitals and nursing note reviewed.  Constitutional:      Appearance: She is well-developed.  HENT:     Head: Normocephalic and atraumatic.  Eyes:     Conjunctiva/sclera: Conjunctivae normal.  Cardiovascular:     Rate and Rhythm: Normal rate and regular rhythm.     Heart sounds: Normal heart sounds.  Pulmonary:     Effort: Pulmonary effort is normal.     Breath sounds: Normal breath sounds.  Abdominal:     General: Bowel sounds are normal.     Palpations: Abdomen is soft.  Musculoskeletal:     Cervical back: Normal range of motion.  Lymphadenopathy:     Cervical: No cervical adenopathy.  Skin:    General: Skin is warm and dry.     Capillary Refill: Capillary refill takes less than 2 seconds.  Neurological:     Mental Status: She is alert and oriented to person, place, and time.  Psychiatric:        Behavior: Behavior normal.      Musculoskeletal Exam: Cervical, thoracic and lumbar spine 1 good range of motion.  Shoulder joints, elbow joints, wrist joints, MCPs PIPs and DIPs were in good range of motion with no synovitis.  Hip joints and knee joints were in good range of motion without any warmth swelling or effusion.  There was no tenderness over ankles or MTPs.  CDAI Exam: CDAI Score: -- Patient Global: --; Provider Global: -- Swollen: --; Tender: -- Joint Exam 11/30/2023   No joint exam has been documented for this visit   There is currently no information documented on the homunculus. Go to the Rheumatology activity and complete the homunculus joint exam.  Investigation: No additional findings.  Imaging: No results found.  Recent Labs: Lab Results  Component Value Date   WBC 3.6 (L) 10/14/2023   HGB 12.2 10/14/2023    PLT 313 10/14/2023   NA 139 10/14/2023   K 4.4 10/14/2023   CL 102 10/14/2023   CO2 27 10/14/2023   GLUCOSE 73 10/14/2023   BUN 13 10/14/2023   CREATININE 0.73 10/14/2023   BILITOT 0.5 10/14/2023   ALKPHOS 130 (H) 04/02/2023   AST 13 10/14/2023   ALT 10 10/14/2023   PROT 7.6 10/14/2023   ALBUMIN 3.2 (L) 04/02/2023   CALCIUM 9.3 10/14/2023   GFRAA 125 03/11/2021   QFTBGOLDPLUS NEGATIVE 06/10/2023    Speciality Comments: Cimzia 06/19- restarted December 06, 2020  Procedures:  No procedures performed Allergies: Patient has no known allergies.   Assessment / Plan:     Visit Diagnoses: Rheumatoid arthritis involving multiple sites with positive rheumatoid factor (HCC) - Positive RF, positive anti-CCP, erosive changes in hands and feet: Patient has been doing well on Cimzia 200 mg subcu  every 2 weeks without any interruption.  She was off Cimzia for a few months during her pregnancy.  She has been taking Cimzia on a regular basis since August.  Patient has a surgery coming up for bunionectomy on March 12.  I advised her to skip her Cimzia this week.  She may resume Cimzia 2 weeks after the surgery if there is no infection and has clearance by Dr. Logan Bores.  High risk medication use - Cimzia 200 mg subcutaneous every 2 weeks since June 2019.  October 14, 2023 WBC 3.6, CMP normal, TB Gold negative on June 10, 2023.  She was advised to get repeat labs in April and then every 3 months.  Information on immunization was placed in the AVS.  She was advised to hold Cimzia if she develops an infection resume after the infection resolves.  Annual skin examination to screen for skin cancer was advised.  Use of sunscreen and sun and protection was advised.  Bilateral bunions - Evaluated by Dr. Logan Bores.  Right foot bunionectomy is  scheduled for December 08, 2022.  Bilateral pes planus-she has been using arch support.  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  She also has a small child.  She was given a prescription for tacrolimus by her dermatologist which is on backorder currently per patient.  Vitamin D deficiency-patient is currently not taking vitamin D.  She is history of vitamin D deficiency.  She was advised to take vitamin D 2000 units daily.  Orders: No orders of the defined types were placed in this encounter.  No orders of the defined types were placed in this encounter.    Follow-Up Instructions: Return in about 5 months (around 05/01/2024) for Rheumatoid arthritis.   Pollyann Savoy, MD  Note - This record has been created using Animal nutritionist.  Chart creation errors have been sought, but may not always  have been located. Such creation errors do not reflect on  the standard of medical care.

## 2023-11-17 ENCOUNTER — Other Ambulatory Visit: Payer: Self-pay

## 2023-11-17 MED ORDER — PIMECROLIMUS 1 % EX CREA: TOPICAL_CREAM | CUTANEOUS | 2 refills | Status: DC

## 2023-11-17 NOTE — Progress Notes (Signed)
Tacrolimus not covered

## 2023-11-22 ENCOUNTER — Encounter: Payer: Self-pay | Admitting: Dermatology

## 2023-11-22 ENCOUNTER — Other Ambulatory Visit: Payer: Self-pay

## 2023-11-22 MED ORDER — ELIDEL 1 % EX CREA: TOPICAL_CREAM | CUTANEOUS | 2 refills | Status: DC

## 2023-11-24 ENCOUNTER — Encounter: Payer: Medicaid Other | Admitting: Podiatry

## 2023-11-30 ENCOUNTER — Ambulatory Visit: Payer: Medicaid Other | Attending: Rheumatology | Admitting: Rheumatology

## 2023-11-30 ENCOUNTER — Other Ambulatory Visit: Payer: Self-pay | Admitting: Dermatology

## 2023-11-30 ENCOUNTER — Encounter: Payer: Self-pay | Admitting: Rheumatology

## 2023-11-30 VITALS — BP 124/82 | HR 74 | Resp 15 | Ht 65.0 in | Wt 157.6 lb

## 2023-11-30 DIAGNOSIS — I7300 Raynaud's syndrome without gangrene: Secondary | ICD-10-CM

## 2023-11-30 DIAGNOSIS — M2141 Flat foot [pes planus] (acquired), right foot: Secondary | ICD-10-CM

## 2023-11-30 DIAGNOSIS — E559 Vitamin D deficiency, unspecified: Secondary | ICD-10-CM

## 2023-11-30 DIAGNOSIS — M21611 Bunion of right foot: Secondary | ICD-10-CM

## 2023-11-30 DIAGNOSIS — M2142 Flat foot [pes planus] (acquired), left foot: Secondary | ICD-10-CM

## 2023-11-30 DIAGNOSIS — Z79899 Other long term (current) drug therapy: Secondary | ICD-10-CM | POA: Diagnosis not present

## 2023-11-30 DIAGNOSIS — M0579 Rheumatoid arthritis with rheumatoid factor of multiple sites without organ or systems involvement: Secondary | ICD-10-CM | POA: Diagnosis not present

## 2023-11-30 DIAGNOSIS — M21612 Bunion of left foot: Secondary | ICD-10-CM

## 2023-11-30 DIAGNOSIS — L309 Dermatitis, unspecified: Secondary | ICD-10-CM

## 2023-11-30 MED ORDER — TACROLIMUS 0.1 % EX OINT
TOPICAL_OINTMENT | Freq: Two times a day (BID) | CUTANEOUS | 2 refills | Status: AC
Start: 2023-11-30 — End: ?

## 2023-11-30 NOTE — Progress Notes (Signed)
 Changed due to pt insurance

## 2023-11-30 NOTE — Patient Instructions (Addendum)
 Standing Labs We placed an order today for your standing lab work.   Please have your standing labs drawn in April and every 3 months  Please have your labs drawn 2 weeks prior to your appointment so that the provider can discuss your lab results at your appointment, if possible.  Please note that you may see your imaging and lab results in MyChart before we have reviewed them. We will contact you once all results are reviewed. Please allow our office up to 72 hours to thoroughly review all of the results before contacting the office for clarification of your results.  WALK-IN LAB HOURS  Monday through Thursday from 8:00 am -12:30 pm and 1:00 pm-5:00 pm and Friday from 8:00 am-12:00 pm.  Patients with office visits requiring labs will be seen before walk-in labs.  You may encounter longer than normal wait times. Please allow additional time. Wait times may be shorter on  Monday and Thursday afternoons.  We do not book appointments for walk-in labs. We appreciate your patience and understanding with our staff.   Labs are drawn by [facility]. Please bring your co-pay at the time of your lab draw.  You may receive a bill from [facility] for your lab work.  Please note if you are on Hydroxychloroquine and and an order has been placed for a Hydroxychloroquine level,  you will need to have it drawn 4 hours or more after your last dose.  If you wish to have your labs drawn at another location, please call the office 24 hours in advance so we can fax the orders.  The office is located at [address], [place], [place] [address]   If you have any questions regarding directions or hours of operation,  please call [phone].   As a reminder, please drink plenty of water prior to coming for your lab work. Thanks!   Vaccines You are taking a medication(s) that can suppress your immune system.  The following immunizations are recommended: Flu annually Covid-19 RSV Td/Tdap (tetanus,  diphtheria, pertussis) every 10 years Pneumonia (Prevnar 15 then Pneumovax 23 at least 1 year apart.  Alternatively, can take Prevnar 20 without needing additional dose) Shingrix: 2 doses from 4 weeks to 6 months apart  Please check with your PCP to make sure you are up to date.  If you have signs or symptoms of an infection or start antibiotics: First, call your PCP for workup of your infection. Hold your medication through the infection, until you complete your antibiotics, and until symptoms resolve if you take the following: Injectable medication (Actemra, Benlysta, Cimzia, Cosentyx, Enbrel, Humira, Kevzara, Orencia, Remicade, Simponi, Stelara, Taltz, Tremfya) Methotrexate Leflunomide (Arava) Mycophenolate (Cellcept) Harriette Ohara, Olumiant, or Rinvoq   Please get annual skin examination to screen for skin cancer.  Please use sunscreen and sun protection while you are on Cimzia.  Stop Cimzia 1 month prior to the surgery and then resume 2 weeks after the surgery if there is no infection.

## 2023-12-16 ENCOUNTER — Inpatient Hospital Stay: Admission: RE | Admit: 2023-12-16 | Payer: Medicaid Other | Source: Ambulatory Visit

## 2023-12-16 HISTORY — DX: Essential (primary) hypertension: I10

## 2023-12-20 ENCOUNTER — Encounter
Admission: RE | Admit: 2023-12-20 | Discharge: 2023-12-20 | Disposition: A | Discharge status: Home / Self Care | Source: Ambulatory Visit | Attending: Podiatry | Admitting: Podiatry

## 2023-12-20 ENCOUNTER — Other Ambulatory Visit: Payer: Self-pay

## 2023-12-20 DIAGNOSIS — I10 Essential (primary) hypertension: Secondary | ICD-10-CM

## 2023-12-20 DIAGNOSIS — Z01810 Encounter for preprocedural cardiovascular examination: Secondary | ICD-10-CM

## 2023-12-20 DIAGNOSIS — Z01818 Encounter for other preprocedural examination: Secondary | ICD-10-CM

## 2023-12-20 HISTORY — DX: Bunion of right foot: M21.611

## 2023-12-20 HISTORY — DX: Vitamin D deficiency, unspecified: E55.9

## 2023-12-20 HISTORY — DX: Dermatitis, unspecified: L30.9

## 2023-12-20 NOTE — Patient Instructions (Addendum)
 Your procedure is scheduled on:12-24-23 Friday Report to the Registration Desk on the 1st floor of the [facility].Then proceed to the 2nd floor Surgery Desk To find out your arrival time, please call [phone] between 1PM - 3PM on:12-23-23 Thursday If your arrival time is 6:00 am, do not arrive before that time as the [facility] entrance doors do not open until 6:00 am.  REMEMBER: Instructions that are not followed completely may result in serious medical risk, up to and including death; or upon the discretion of your surgeon and anesthesiologist your surgery may need to be rescheduled.  Do not eat food OR drink any liquids after midnight the night before surgery.  No gum chewing or hard candies.  One week prior to surgery:Stop NOW (12-20-23) Stop Anti-inflammatories (NSAIDS) such as Advil, Aleve, Ibuprofen, Motrin, Naproxen, Naprosyn and Aspirin based products such as Excedrin, Goody's Powder, BC Powder. Stop ANY OVER THE COUNTER supplements until after surgery (Prenatal Vitamin)  You may however, continue to take Tylenol if needed for pain up until the day of surgery.  Continue taking all of your other prescription medications up until the day of surgery.  ON THE DAY OF SURGERY ONLY TAKE THESE MEDICATIONS WITH SIPS OF WATER: -labetalol (NORMODYNE)   No Alcohol for 24 hours before or after surgery.  No Smoking including e-cigarettes for 24 hours before surgery.  No chewable tobacco products for at least 6 hours before surgery.  No nicotine patches on the day of surgery.  Do not use any "recreational" drugs for at least a week (preferably 2 weeks) before your surgery.  Please be advised that the combination of cocaine and anesthesia may have negative outcomes, up to and including death. If you test positive for cocaine, your surgery will be cancelled.  On the morning of surgery brush your teeth with toothpaste and water, you may rinse your mouth with mouthwash if you wish. Do  not swallow any toothpaste or mouthwash.  Use CHG Soap as directed on instruction sheet.  Bring your Breast Pump to the hospital  Do not wear jewelry, make-up, hairpins, clips or nail polish.  For welded (permanent) jewelry: bracelets, anklets, waist bands, etc.  Please have this removed prior to surgery.  If it is not removed, there is a chance that hospital personnel will need to cut it off on the day of surgery.  Do not wear lotions, powders, or perfumes.   Do not shave body hair from the neck down 48 hours before surgery.  Contact lenses, hearing aids and dentures may not be worn into surgery.  Do not bring valuables to the hospital. [facility] is not responsible for any missing/lost belongings or valuables.  Notify your doctor if there is any change in your medical condition (cold, fever, infection).  Wear comfortable clothing (specific to your surgery type) to the hospital.  After surgery, you can help prevent lung complications by doing breathing exercises.  Take deep breaths and cough every 1-2 hours. Your doctor may order a device called an Incentive Spirometer to help you take deep breaths. When coughing or sneezing, hold a pillow firmly against your incision with both hands. This is called "splinting." Doing this helps protect your incision. It also decreases belly discomfort.  If you are being admitted to the hospital overnight, leave your suitcase in the car. After surgery it may be brought to your room.  In case of increased patient census, it may be necessary for you, the patient, to continue your postoperative  care in the Same Day Surgery department.  If you are being discharged the day of surgery, you will not be allowed to drive home. You will need a responsible individual to drive you home and stay with you for 24 hours after surgery.   If you are taking public transportation, you will need to have a responsible individual with you.  Please call the  Pre-admissions Testing Dept. at [phone] if you have any questions about these instructions.  Surgery Visitation Policy:  Patients having surgery or a procedure may have two visitors.  Children under the age of 77 must have an adult with them who is not the patient.  Temporary Visitor Restrictions Due to increasing cases of flu, RSV and COVID-19: Children ages 93 and under will not be able to visit patients in [facility] hospitals under most circumstances.     Preparing for Surgery with CHLORHEXIDINE GLUCONATE (CHG) Soap  Chlorhexidine Gluconate (CHG) Soap  o An antiseptic cleaner that kills germs and bonds with the skin to continue killing germs even after washing  o Used for showering the night before surgery and morning of surgery  Before surgery, you can play an important role by reducing the number of germs on your skin.  CHG (Chlorhexidine gluconate) soap is an antiseptic cleanser which kills germs and bonds with the skin to continue killing germs even after washing.  Please do not use if you have an allergy to CHG or antibacterial soaps. If your skin becomes reddened/irritated stop using the CHG.  1. Shower the NIGHT BEFORE SURGERY and the MORNING OF SURGERY with CHG soap.  2. If you choose to wash your hair, wash your hair first as usual with your normal shampoo.  3. After shampooing, rinse your hair and body thoroughly to remove the shampoo.  4. Use CHG as you would any other liquid soap. You can apply CHG directly to the skin and wash gently with a scrungie or a clean washcloth.  5. Apply the CHG soap to your body only from the neck down. Do not use on open wounds or open sores. Avoid contact with your eyes, ears, mouth, and genitals (private parts). Wash face and genitals (private parts) with your normal soap.  6. Wash thoroughly, paying special attention to the area where your surgery will be performed.  7. Thoroughly rinse your body with warm water.  8. Do  not shower/wash with your normal soap after using and rinsing off the CHG soap.  9. Pat yourself dry with a clean towel.  10. Wear clean pajamas to bed the night before surgery.  12. Place clean sheets on your bed the night of your first shower and do not sleep with pets.  13. Shower again with the CHG soap on the day of surgery prior to arriving at the hospital.  14. Do not apply any deodorants/lotions/powders.  15. Please wear clean clothes to the hospital.

## 2023-12-21 ENCOUNTER — Encounter
Admission: RE | Admit: 2023-12-21 | Discharge: 2023-12-21 | Disposition: A | Discharge status: Home / Self Care | Source: Ambulatory Visit | Attending: Podiatry | Admitting: Podiatry

## 2023-12-21 DIAGNOSIS — I10 Essential (primary) hypertension: Secondary | ICD-10-CM | POA: Insufficient documentation

## 2023-12-21 DIAGNOSIS — Z01810 Encounter for preprocedural cardiovascular examination: Secondary | ICD-10-CM | POA: Insufficient documentation

## 2023-12-24 ENCOUNTER — Encounter: Payer: Self-pay | Admitting: Podiatry

## 2023-12-24 ENCOUNTER — Other Ambulatory Visit: Payer: Self-pay

## 2023-12-24 ENCOUNTER — Ambulatory Visit: Payer: Self-pay | Admitting: Urgent Care

## 2023-12-24 ENCOUNTER — Ambulatory Visit: Admitting: Anesthesiology

## 2023-12-24 ENCOUNTER — Encounter
Admission: RE | Disposition: A | Discharge status: Home / Self Care | Payer: Self-pay | Source: Home / Self Care | Attending: Podiatry

## 2023-12-24 ENCOUNTER — Ambulatory Visit
Admission: RE | Admit: 2023-12-24 | Discharge: 2023-12-24 | Disposition: A | Discharge status: Home / Self Care | Payer: Medicaid Other | Attending: Podiatry | Admitting: Podiatry

## 2023-12-24 ENCOUNTER — Ambulatory Visit

## 2023-12-24 DIAGNOSIS — M2011 Hallux valgus (acquired), right foot: Secondary | ICD-10-CM

## 2023-12-24 DIAGNOSIS — Z01818 Encounter for other preprocedural examination: Secondary | ICD-10-CM

## 2023-12-24 DIAGNOSIS — I7300 Raynaud's syndrome without gangrene: Secondary | ICD-10-CM | POA: Diagnosis not present

## 2023-12-24 DIAGNOSIS — I10 Essential (primary) hypertension: Secondary | ICD-10-CM | POA: Diagnosis not present

## 2023-12-24 LAB — POCT PREGNANCY, URINE: Preg Test, Ur: NEGATIVE

## 2023-12-24 SURGERY — BUNIONECTOMY
Anesthesia: General | Site: Toe | Laterality: Right

## 2023-12-24 MED ORDER — PROPOFOL 10 MG/ML IV BOLUS
INTRAVENOUS | Status: AC
  Filled 2023-12-24: qty 20

## 2023-12-24 MED ORDER — ACETAMINOPHEN 10 MG/ML IV SOLN: 1000.0000 mg | Freq: Once | INTRAVENOUS | Status: DC | PRN

## 2023-12-24 MED ORDER — CHLORHEXIDINE GLUCONATE 0.12 % MT SOLN: 15.0000 mL | Freq: Once | OROMUCOSAL | Status: DC

## 2023-12-24 MED ORDER — BUPIVACAINE LIPOSOME 1.3 % IJ SUSP
INTRAMUSCULAR | Status: AC
  Filled 2023-12-24: qty 20

## 2023-12-24 MED ORDER — OXYCODONE HCL 5 MG/5ML PO SOLN: 5.0000 mg | Freq: Once | ORAL | Status: DC | PRN

## 2023-12-24 MED ORDER — OXYCODONE-ACETAMINOPHEN 5-325 MG PO TABS: 1.0000 | ORAL_TABLET | ORAL | 0 refills | Status: AC | PRN

## 2023-12-24 MED ORDER — ONDANSETRON HCL 4 MG/2ML IJ SOLN
INTRAMUSCULAR | Status: DC | PRN
  Administered 2023-12-24: 4 mg via INTRAVENOUS

## 2023-12-24 MED ORDER — LIDOCAINE HCL (PF) 1 % IJ SOLN
INTRAMUSCULAR | Status: AC
Start: 2023-12-24 — End: ?
  Filled 2023-12-24: qty 30

## 2023-12-24 MED ORDER — PROPOFOL 10 MG/ML IV BOLUS
INTRAVENOUS | Status: DC | PRN
  Administered 2023-12-24: 50 ug/kg/min via INTRAVENOUS
  Administered 2023-12-24: 150 mg via INTRAVENOUS

## 2023-12-24 MED ORDER — LACTATED RINGERS IV SOLN: INTRAVENOUS | Status: DC

## 2023-12-24 MED ORDER — CEFAZOLIN SODIUM-DEXTROSE 2-4 GM/100ML-% IV SOLN
INTRAVENOUS | Status: AC
  Filled 2023-12-24: qty 100

## 2023-12-24 MED ORDER — PROPOFOL 1000 MG/100ML IV EMUL
INTRAVENOUS | Status: AC
  Filled 2023-12-24: qty 100

## 2023-12-24 MED ORDER — IBUPROFEN 800 MG PO TABS: 800.0000 mg | ORAL_TABLET | Freq: Three times a day (TID) | ORAL | 1 refills | Status: DC

## 2023-12-24 MED ORDER — MIDAZOLAM HCL 2 MG/2ML IJ SOLN
INTRAMUSCULAR | Status: AC
  Filled 2023-12-24: qty 2

## 2023-12-24 MED ORDER — EPHEDRINE SULFATE-NACL 50-0.9 MG/10ML-% IV SOSY
PREFILLED_SYRINGE | INTRAVENOUS | Status: DC | PRN
  Administered 2023-12-24: 5 mg via INTRAVENOUS

## 2023-12-24 MED ORDER — BUPIVACAINE HCL (PF) 0.5 % IJ SOLN
INTRAMUSCULAR | Status: DC | PRN
  Administered 2023-12-24: 9 mL

## 2023-12-24 MED ORDER — DEXAMETHASONE SODIUM PHOSPHATE 10 MG/ML IJ SOLN
INTRAMUSCULAR | Status: DC | PRN
  Administered 2023-12-24: 5 mg via INTRAVENOUS

## 2023-12-24 MED ORDER — ORAL CARE MOUTH RINSE
15.0000 mL | Freq: Once | OROMUCOSAL | Status: DC
Start: 2023-12-24 — End: 2023-12-24

## 2023-12-24 MED ORDER — CEFAZOLIN SODIUM-DEXTROSE 2-4 GM/100ML-% IV SOLN
2.0000 g | INTRAVENOUS | Status: AC
  Administered 2023-12-24: 2 g via INTRAVENOUS

## 2023-12-24 MED ORDER — LIDOCAINE HCL (PF) 2 % IJ SOLN
INTRAMUSCULAR | Status: AC
  Filled 2023-12-24: qty 5

## 2023-12-24 MED ORDER — KETAMINE HCL 50 MG/5ML IJ SOSY
PREFILLED_SYRINGE | INTRAMUSCULAR | Status: AC
  Filled 2023-12-24: qty 5

## 2023-12-24 MED ORDER — CHLORHEXIDINE GLUCONATE 0.12 % MT SOLN
OROMUCOSAL | Status: AC
  Filled 2023-12-24: qty 15

## 2023-12-24 MED ORDER — LIDOCAINE HCL (CARDIAC) PF 100 MG/5ML IV SOSY
PREFILLED_SYRINGE | INTRAVENOUS | Status: DC | PRN
  Administered 2023-12-24: 80 mg via INTRAVENOUS

## 2023-12-24 MED ORDER — DROPERIDOL 2.5 MG/ML IJ SOLN: 0.6250 mg | Freq: Once | INTRAMUSCULAR | Status: DC | PRN

## 2023-12-24 MED ORDER — OXYCODONE HCL 5 MG PO TABS: 5.0000 mg | ORAL_TABLET | Freq: Once | ORAL | Status: DC | PRN

## 2023-12-24 MED ORDER — FENTANYL CITRATE (PF) 100 MCG/2ML IJ SOLN: 25.0000 ug | INTRAMUSCULAR | Status: DC | PRN

## 2023-12-24 MED ORDER — BUPIVACAINE HCL (PF) 0.5 % IJ SOLN
INTRAMUSCULAR | Status: AC
  Filled 2023-12-24: qty 30

## 2023-12-24 MED ORDER — FENTANYL CITRATE (PF) 100 MCG/2ML IJ SOLN
INTRAMUSCULAR | Status: AC
Start: 2023-12-24 — End: ?
  Filled 2023-12-24: qty 2

## 2023-12-24 MED ORDER — FENTANYL CITRATE (PF) 100 MCG/2ML IJ SOLN
INTRAMUSCULAR | Status: DC | PRN
  Administered 2023-12-24: 100 ug via INTRAVENOUS

## 2023-12-24 MED ORDER — BUPIVACAINE LIPOSOME 1.3 % IJ SUSP
INTRAMUSCULAR | Status: DC | PRN
  Administered 2023-12-24: 9 mL

## 2023-12-24 MED ORDER — KETAMINE HCL 50 MG/5ML IJ SOSY
PREFILLED_SYRINGE | INTRAMUSCULAR | Status: DC | PRN
  Administered 2023-12-24: 20 mg via INTRAVENOUS

## 2023-12-24 SURGICAL SUPPLY — 44 items
BENZOIN TINCTURE PRP APPL 2/3 (GAUZE/BANDAGES/DRESSINGS) IMPLANT
BIT DRILL 2.1 LONG CANN (MISCELLANEOUS) IMPLANT
BLADE OSC/SAGITTAL MD 5.5X18 (BLADE) IMPLANT
BLADE SAW SAG MICRO THIN 65D (BLADE) ×1 IMPLANT
BLADE SURG 15 STRL LF DISP TIS (BLADE) ×2 IMPLANT
BLADE SW THK.38XMED LNG THN (BLADE) IMPLANT
BNDG COHESIVE 4X5 TAN STRL LF (GAUZE/BANDAGES/DRESSINGS) ×1 IMPLANT
BNDG ESMARCH 4X12 STRL LF (GAUZE/BANDAGES/DRESSINGS) ×1 IMPLANT
BNDG GAUZE DERMACEA FLUFF 4 (GAUZE/BANDAGES/DRESSINGS) IMPLANT
BNDG STRETCH GAUZE 3IN X12FT (GAUZE/BANDAGES/DRESSINGS) ×1 IMPLANT
BOOT STEPPER DURA MED (SOFTGOODS) IMPLANT
CHLORAPREP W/TINT 26 (MISCELLANEOUS) ×1 IMPLANT
CUFF TOURN SGL QUICK 18X4 (TOURNIQUET CUFF) ×1 IMPLANT
DRAPE FLUOR MINI C-ARM 54X84 (DRAPES) ×1 IMPLANT
ELECT REM PT RETURN 9FT ADLT (ELECTROSURGICAL) ×1 IMPLANT
ELECTRODE REM PT RTRN 9FT ADLT (ELECTROSURGICAL) ×1 IMPLANT
GAUZE PAD ABD 8X10 STRL (GAUZE/BANDAGES/DRESSINGS) ×1 IMPLANT
GAUZE SPONGE 4X4 12PLY STRL (GAUZE/BANDAGES/DRESSINGS) ×1 IMPLANT
GLOVE BIO SURGEON STRL SZ8 (GLOVE) ×1 IMPLANT
GLOVE BIOGEL PI IND STRL 8 (GLOVE) ×1 IMPLANT
GOWN STRL REUS W/ TWL LRG LVL3 (GOWN DISPOSABLE) ×1 IMPLANT
K-WIRE DOUBLE END 1.1X150 (WIRE) ×1 IMPLANT
K-WIRE SINGLE SMOOTH 1.1X150MM (WIRE) ×2 IMPLANT
KIT TURNOVER KIT A (KITS) ×1 IMPLANT
KWIRE DOUBLE END 1.1X150 (WIRE) IMPLANT
KWIRE SINGLE SMOOTH 1.1X150MM (WIRE) IMPLANT
PACK EXTREMITY ARMC (MISCELLANEOUS) ×1 IMPLANT
PAD PREP OB/GYN DISP 24X41 (PERSONAL CARE ITEMS) ×1 IMPLANT
SCREW CANN FT MINI MONST 3X14 (Screw) IMPLANT
SCREW CANN FT MINI MONST 3X16 (Screw) IMPLANT
SCREW HEADED COUNTER SINK 3.0 IMPLANT
SLEEVE SCD COMPRESS KNEE MED (STOCKING) ×1 IMPLANT
STOCKINETTE 48X4 2 PLY STRL (GAUZE/BANDAGES/DRESSINGS) IMPLANT
STOCKINETTE ORTHO 6X25 (MISCELLANEOUS) ×1 IMPLANT
STOCKINETTE STRL 4IN 9604848 (GAUZE/BANDAGES/DRESSINGS) ×1 IMPLANT
STRIP CLOSURE SKIN 1/4X4 (GAUZE/BANDAGES/DRESSINGS) IMPLANT
SUT ETHILON 4-0 FS2 18XMFL BLK (SUTURE) IMPLANT
SUT MNCRL AB 3-0 PS2 27 (SUTURE) ×1 IMPLANT
SUT MNCRL+ 5-0 UNDYED PC-3 (SUTURE) IMPLANT
SUT STRATA 4-0 30 PS-2 (SUTURE) IMPLANT
SUT VIC AB 3-0 PS2 18XBRD (SUTURE) IMPLANT
SUT VIC AB 4-0 PS2 27 (SUTURE) IMPLANT
SUTURE ETHLN 4-0 FS2 18XMF BLK (SUTURE) IMPLANT
SYR CONTROL 10ML LL (SYRINGE) ×1 IMPLANT

## 2023-12-24 NOTE — Interval H&P Note (Signed)
 History and Physical Interval Note:  12/24/2023 3:24 PM  CAROLLYN ETCHEVERRY  has presented today for surgery, with the diagnosis of Hallux valgus.  The various methods of treatment have been discussed with the patient and family. After consideration of risks, benefits and other options for treatment, the patient has consented to  Procedure(s): AUSTIN BUNIONECTOMY (Right) as a surgical intervention.  The patient's history has been reviewed, patient examined, no change in status, stable for surgery.  I have reviewed the patient's chart and labs.  Questions were answered to the patient's satisfaction.     Felecia Shelling

## 2023-12-24 NOTE — Transfer of Care (Signed)
 Immediate Anesthesia Transfer of Care Note  Patient: Meghan Arnold  Procedure(s) Performed: [name] (Right: Toe)  Patient Location: PACU  Anesthesia Type:General  Level of Consciousness: drowsy  Airway & Oxygen Therapy: Patient Spontanous Breathing  Post-op Assessment: Report given to RN and Post -op Vital signs reviewed and stable  Post vital signs: stable  Last Vitals:  Vitals Value Taken Time  BP 114/64 12/24/23 1646  Temp 98.4   Pulse 88 12/24/23 1649  Resp 15 12/24/23 1649  SpO2 100 % 12/24/23 1649  Vitals shown include unfiled device data.  Last Pain:  Vitals:   12/24/23 1310  TempSrc: Temporal  PainSc: 0-No pain         Complications: No notable events documented.

## 2023-12-24 NOTE — Brief Op Note (Signed)
 12/24/2023  4:45 PM  PATIENT:  Meghan Arnold  38 y.o. female  PRE-OPERATIVE DIAGNOSIS:  Hallux valgus  POST-OPERATIVE DIAGNOSIS:  Hallux valgus  PROCEDURE:  Procedure(s): AUSTIN BUNIONECTOMY (Right)  SURGEON:  Surgeons and Role:    Felecia Shelling, DPM - Primary  PHYSICIAN ASSISTANT:   ASSISTANTS: none   ANESTHESIA:   local and general 50:50 mixture of 1% lidocaine plain with 0.5% Marcaine plain totaling 18 with mL   EBL:  2 mL   BLOOD ADMINISTERED:none  DRAINS: None  LOCAL MEDICATIONS USED:  MARCAINE   , XYLOCAINE , and Amount: 18 ml  SPECIMEN:  No Specimen  DISPOSITION OF SPECIMEN:  N/A  COUNTS:  YES  TOURNIQUET:   Total Tourniquet Time Documented: Calf (Right) - 45 minutes Total: Calf (Right) - 45 minutes   DICTATION: .Reubin Milan Dictation  PLAN OF CARE: Discharge to home after PACU  PATIENT DISPOSITION:  PACU - hemodynamically stable.   Delay start of Pharmacological VTE agent (>24hrs) due to surgical blood loss or risk of bleeding: not applicable  Felecia Shelling, DPM [facility]  Dr. Felecia Shelling, DPM    [address] [place], [place] [address]                Office [phone]  Fax [phone]

## 2023-12-24 NOTE — Op Note (Signed)
 OPERATIVE REPORT Patient name: Meghan Arnold MRN: [id number] DOB: [birth year]  DOS: 12/24/23  Preop Dx: Hallux valgus right Postop Dx: same  Procedure:  1.  Bunionectomy with distal first metatarsal osteotomy right  Surgeon: Felecia Shelling DPM  Anesthesia: 50-50 mixture of 2% lidocaine plain with 0.5% Marcaine plain totaling 18 mL infiltrated in the patient's right lower extremity  Hemostasis: Calf tourniquet inflated to a pressure of after esmarch exsanguination   EBL: Minimal mL Materials: Paragon 28 3.24mm screws x 2 Injectables: None Pathology: None  Condition: The patient tolerated the procedure and anesthesia well. No complications noted or reported   Justification for procedure: The patient is a 38 y.o. female who presents today for surgical correction of symptomatic bunion to the right foot. The patient was told benefits as well as possible side effects of the surgery. The patient consented for surgical correction. The patient consent form was reviewed. All patient questions were answered. No guarantees were expressed or implied. The patient and the surgeon both signed the patient consent form with the witness present and placed in the patient's chart.   Procedure in Detail: The patient was brought to the operating room, placed in the operating table in the supine position at which time an aseptic scrub and drape were performed about the patient's respective lower extremity after anesthesia was induced as described above. Attention was then directed to the surgical area where procedure number one commenced.  Procedure #1: Bunionectomy with distal first metatarsal osteotomy right A 4 cm linear longitudinal skin incision was planned and made overlying the first MTP of the right foot.  Incision was carried down to the level of joint capsule with care taken to cut clamp ligate and retract away all small neurovascular structures traversing the incision site as well as  identification of the EHL tendon which was identified and retracted and preserved.  An inverted L-shaped capsulotomy was performed of the first MTP and the ligamentous structures were sharply reflected away using a #15 scalpel to expose the underlying joint and hypertrophic medial eminence of the first metatarsal. McGlamery elevator was then inserted across the joint and utilized for lateral release.  At this time a sagittal blade mounted sagittal saw was utilized to resect away the hypertrophic medial eminence of the first metatarsal.  A chevron type osteotomy was then made using a oscillating blade mounted on oscillating saw.  Apex of the chevron beginning at the concentric center of the first metatarsal with the plantar osteotomy exiting the plantar cortex just proximal to the sesamoid apparatus while the dorsal osteotomy exiting the dorsal cortex much more proximal than plantar in essence creating a long dorsal arm to facilitate translocation of the capital fragment and reduction of the IM angle. The capital fragment was then translocated laterally to correct for a pathologically increased IM angle.  Verified by intraoperative x-ray fluoroscopy and permanent internal fixation of the osteotomy was achieved using two 3.0 mm cortical screws.  The osseous step-off which had been created due to the translocation of the capital fragment was then resected away using a sagittal blade mounted on a sagittal saw.  Again verified by intraoperative x-ray fluoroscopy to verify the placement of the orthopedic screws and reduction of the IM angle which was satisfactory. Irrigation utilized and routine layered primary closure ensued.  3-0 Vicryl suture was utilized to repair the medial eminence of the ligament.  Any redundant ligamentous tissue was sharply reflected away in essence creating a medial capsulorrhaphy.  This  was followed by 4-0 Vicryl suture to reapproximate subcuticular tissue followed by 5-0 Monocryl to  reapproximate superficial skin edges.  Reinforced with quadrant Steri-Strips.  Dry sterile compressive dressings were then applied to all previously mentioned incision sites about the patient's lower extremity. The tourniquet which was used for hemostasis was deflated. All normal neurovascular responses including pink color and warmth returned all the digits of patient's lower extremity.  The patient was then transferred from the operating room to the recovery room having tolerated the procedure and anesthesia well. All vital signs are stable. After a brief stay in the recovery room the patient was discharged with postoperative orders provided  IMPRESSION: S/P bunionectomy of the right foot  Felecia Shelling, DPM [facility]  Dr. Felecia Shelling, DPM    [address] [place], [place] [address]                Office [phone]  Fax [phone]

## 2023-12-24 NOTE — Anesthesia Preprocedure Evaluation (Signed)
 Anesthesia Evaluation  Patient identified by MRN, date of birth, ID band Patient awake    Reviewed: Allergy & Precautions, H&P , NPO status , Patient's Chart, lab work & pertinent test results  Airway Mallampati: II  TM Distance: >3 FB Neck ROM: full    Dental no notable dental hx.    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 April Manson , believes right side laterality  12/01/2017: LAPAROSCOPIC UNILATERAL SALPINGECTOMY; Right     Comment:  Procedure: LAPAROSCOPIC LYSIS OF ADHESIONS, RIGHT TUBAL               OVARIOLYSIS, RIGHT OVARIAN CYST               ASPIRATION,CHROMOPERTUBATION, RIGHT FIMBRIOPLASTY;                Surgeon: Fermin Schwab, MD;  Location: [facility];  Service: Gynecology;  Laterality: Right; No date: LAPAROSCOPY 08/2012: OVARIAN CYST REMOVAL     Comment:  dr Jean Rosenthal at [facility] ob[facility] in [place] ; has               abdominal insision       Reproductive/Obstetrics negative OB ROS                             Anesthesia Physical Anesthesia Plan  ASA: 2  Anesthesia Plan: General LMA   Post-op Pain Management: Toradol IV (intra-op)*, Ofirmev IV (intra-op)* and Regional block*   Induction: Intravenous  PONV Risk Score and Plan: 3 and Dexamethasone, Ondansetron, Midazolam and Treatment may vary due to age or medical condition  Airway Management Planned: LMA  Additional Equipment:   Intra-op Plan:   Post-operative Plan: Extubation in OR  Informed Consent: I have reviewed the patients History and Physical, chart, labs and discussed the procedure including the risks, benefits and alternatives for the proposed anesthesia with the patient or authorized representative who has indicated his/her understanding and acceptance.     Dental Advisory Given  Plan Discussed with: Anesthesiologist, CRNA and Surgeon  Anesthesia Plan Comments:        Anesthesia Quick Evaluation

## 2023-12-24 NOTE — Discharge Instructions (Signed)
 Weightbearing as tolerated in a cam boot with the assistance of crutches.  Leave dressings clean dry and intact until follow-up.  Leave cam boot intact.  Please refer to postoperative care instructions provided in discharge papers

## 2023-12-24 NOTE — H&P (Signed)
 Pre Procedure note for inpatients:   Meghan Arnold has been scheduled for Procedure(s): AUSTIN BUNIONECTOMY (Right) today. The various methods of treatment have been discussed with the patient. After consideration of the risks, benefits and treatment options the patient has consented to the planned procedure.   The patient has been seen and labs reviewed. There are no changes in the patient's condition to prevent proceeding with the planned procedure today.  Recent labs:  Lab Results  Component Value Date   WBC 3.6 (L) 10/14/2023   HGB 12.2 10/14/2023   HCT 37.3 10/14/2023   PLT 313 10/14/2023   GLUCOSE 73 10/14/2023   CHOL 214 (H) 10/14/2023   TRIG 41 10/14/2023   HDL 81 10/14/2023   LDLCALC 121 (H) 10/14/2023   ALT 10 10/14/2023   AST 13 10/14/2023   NA 139 10/14/2023   K 4.4 10/14/2023   CL 102 10/14/2023   CREATININE 0.73 10/14/2023   BUN 13 10/14/2023   CO2 27 10/14/2023   TSH 0.64 11/22/2017    Felecia Shelling, DPM 12/24/2023 3:24 PM

## 2023-12-26 NOTE — Anesthesia Postprocedure Evaluation (Signed)
 Anesthesia Post Note  Patient: Meghan Arnold  Procedure(s) Performed: [name] (Right: Toe)  Anesthesia Type: General Anesthetic complications: no   No notable events documented.   Last Vitals:  Vitals:   12/24/23 1725 12/24/23 1745  BP: 130/74 129/82  Pulse: 77 78  Resp: 15 13  Temp:  36.6 C  SpO2: 100% 100%    Last Pain:  Vitals:   12/24/23 1745  TempSrc: Tympanic  PainSc: 0-No pain                 Louie Boston

## 2023-12-27 ENCOUNTER — Encounter: Payer: Self-pay | Admitting: Podiatry

## 2023-12-29 ENCOUNTER — Other Ambulatory Visit: Payer: Self-pay | Admitting: Physician Assistant

## 2023-12-29 ENCOUNTER — Encounter: Payer: Self-pay | Admitting: Podiatry

## 2023-12-29 ENCOUNTER — Ambulatory Visit (INDEPENDENT_AMBULATORY_CARE_PROVIDER_SITE_OTHER): Payer: Medicaid Other | Admitting: Podiatry

## 2023-12-29 ENCOUNTER — Ambulatory Visit (INDEPENDENT_AMBULATORY_CARE_PROVIDER_SITE_OTHER)

## 2023-12-29 ENCOUNTER — Encounter: Payer: Self-pay | Admitting: Rheumatology

## 2023-12-29 DIAGNOSIS — Z9889 Other specified postprocedural states: Secondary | ICD-10-CM | POA: Diagnosis not present

## 2023-12-29 DIAGNOSIS — M2011 Hallux valgus (acquired), right foot: Secondary | ICD-10-CM | POA: Diagnosis not present

## 2023-12-29 DIAGNOSIS — M0579 Rheumatoid arthritis with rheumatoid factor of multiple sites without organ or systems involvement: Secondary | ICD-10-CM

## 2023-12-29 DIAGNOSIS — Z79899 Other long term (current) drug therapy: Secondary | ICD-10-CM

## 2023-12-29 NOTE — Telephone Encounter (Signed)
 Last Fill: 10/19/2023  Labs: 10/14/2023 LDL is elevated.  White cell count is mildly decreased at 3.6 due to immunosuppression.  CMP is normal   TB Gold: 06/10/2023 Neg    Next Visit: 05/02/2024  Last Visit: 11/30/2023  [id number] arthritis involving multiple sites with positive rheumatoid factor   Current Dose per office note 11/30/2023: Cimzia 200 mg subcutaneous every 2 weeks   Okay to refill Cimzia?

## 2023-12-29 NOTE — Progress Notes (Signed)
   Chief Complaint  Patient presents with   Routine Post Op    POV #1 DOS 12/24/23 --- BUNIONECTOMY WITH OSTEOTOMY RIGHT    Subjective:  Patient presents today status post bunionectomy with distal first metatarsal osteotomy of the right foot.  DOS: 12/24/2023.  Patient doing well.  Minimal WBAT in the cam boot as instructed.  Past Medical History:  Diagnosis Date   Benign tumor of ovary 2013   Bunion of right foot    Chlamydia    Eczema    Family history of breast cancer    History of methicillin resistant staphylococcus aureus (MRSA) 2008   Hypertension    Raynaud's phenomenon without gangrene    mgmt rheum Dr Tresa Moore   Rheumatoid arthritis (HCC) 2017   Vitamin D deficiency    Wears contact lenses     Past Surgical History:  Procedure Laterality Date   BUNIONECTOMY Right 12/24/2023   Procedure: [name];  Surgeon: Felecia Shelling, DPM;  Location: [facility];  Service: Orthopedics/Podiatry;  Laterality: Right;   LAPAROSCOPIC SALPINGOOPHERECTOMY  2013   done by dr. April Manson , believes right side laterality    LAPAROSCOPIC UNILATERAL SALPINGECTOMY Right 12/01/2017   Procedure: LAPAROSCOPIC LYSIS OF ADHESIONS, RIGHT TUBAL OVARIOLYSIS, RIGHT OVARIAN CYST ASPIRATION,CHROMOPERTUBATION, RIGHT FIMBRIOPLASTY;  Surgeon: Fermin Schwab, MD;  Location: [facility];  Service: Gynecology;  Laterality: Right;   LAPAROSCOPY     OVARIAN CYST REMOVAL  08/2012   dr Jean Rosenthal at [facility] in  ; has abdominal insision     No Known Allergies  Objective/Physical Exam Neurovascular status intact.  Incision well coapted with sutures intact. No sign of infectious process noted. No dehiscence. No active bleeding noted.  Minimal edema noted to the surgical extremity.  Radiographic Exam RT foot 12/29/2023:  Orthopedic hardware and osteotomies sites appear to be stable with routine healing.  Adequate reduction of the IM angle with overall good alignment of the  first ray  Assessment: 1. s/p bunionectomy with distal first metatarsal osteotomy right. DOS: 12/24/2023   Plan of Care:  -Patient was evaluated. X-rays reviewed - Dressings changed.  Leave clean dry and intact x 1 week -Continue minimal WBAT cam boot -Return to clinic 1 week suture removal   Felecia Shelling, DPM [facility]  Dr. Felecia Shelling, DPM    [address] [place], [place] [address]                Office [phone]  Fax [phone]

## 2023-12-29 NOTE — Addendum Note (Signed)
 Addended by: Henriette Combs on: 12/29/2023 10:05 AM   Modules accepted: Orders

## 2024-01-05 ENCOUNTER — Ambulatory Visit (INDEPENDENT_AMBULATORY_CARE_PROVIDER_SITE_OTHER): Payer: Medicaid Other | Admitting: Podiatry

## 2024-01-05 DIAGNOSIS — M2011 Hallux valgus (acquired), right foot: Secondary | ICD-10-CM

## 2024-01-05 NOTE — Progress Notes (Signed)
   Chief Complaint  Patient presents with   Routine Post Op    RM#9 POV #2 DOS 12/24/23 --- BUNIONECTOMY WITH OSTEOTOMY RIGHT-Patient states is doing well no issues or concerns pain is minimal. No swelling or redness to incision site looks to be in a good healing state.    Subjective:  Patient presents today status post bunionectomy with distal first metatarsal osteotomy of the right foot.  DOS: 12/24/2023.  Patient doing well.  Minimal WBAT in the cam boot as instructed.  Past Medical History:  Diagnosis Date   Benign tumor of ovary 2013   Bunion of right foot    Chlamydia    Eczema    Family history of breast cancer    History of methicillin resistant staphylococcus aureus (MRSA) 2008   Hypertension    Raynaud's phenomenon without gangrene    mgmt rheum Dr Tresa Moore   Rheumatoid arthritis (HCC) 2017   Vitamin D deficiency    Wears contact lenses     Past Surgical History:  Procedure Laterality Date   BUNIONECTOMY Right 12/24/2023   Procedure: [name];  Surgeon: Felecia Shelling, DPM;  Location: [facility];  Service: Orthopedics/Podiatry;  Laterality: Right;   LAPAROSCOPIC SALPINGOOPHERECTOMY  2013   done by dr. April Manson , believes right side laterality    LAPAROSCOPIC UNILATERAL SALPINGECTOMY Right 12/01/2017   Procedure: LAPAROSCOPIC LYSIS OF ADHESIONS, RIGHT TUBAL OVARIOLYSIS, RIGHT OVARIAN CYST ASPIRATION,CHROMOPERTUBATION, RIGHT FIMBRIOPLASTY;  Surgeon: Fermin Schwab, MD;  Location: [facility];  Service: Gynecology;  Laterality: Right;   LAPAROSCOPY     OVARIAN CYST REMOVAL  08/2012   dr Jean Rosenthal at [facility] in [place] ; has abdominal insision     No Known Allergies  Objective/Physical Exam Neurovascular status intact.  Incision well coapted with sutures intact. No sign of infectious process noted. No dehiscence. No active bleeding noted.  Minimal edema noted to the surgical extremity.  Radiographic Exam RT foot 12/29/2023:   Orthopedic hardware and osteotomies sites appear to be stable with routine healing.  Adequate reduction of the IM angle with overall good alignment of the first ray  Assessment: 1. s/p bunionectomy with distal first metatarsal osteotomy right. DOS: 12/24/2023   Plan of Care:  -Patient was evaluated.  -Continue WBAT CAM boot -Return to clinic 2 weeks follow-up x-ray  *Cosmetologist. Can take the time off necessary postop   Felecia Shelling, DPM [facility]  Dr. Felecia Shelling, DPM    [address] [place], [place] [address]                Office [phone]  Fax [phone]

## 2024-01-18 ENCOUNTER — Ambulatory Visit: Payer: Medicaid Other | Admitting: Dermatology

## 2024-01-18 ENCOUNTER — Telehealth: Payer: Self-pay | Admitting: Pharmacist

## 2024-01-18 ENCOUNTER — Encounter: Payer: Self-pay | Admitting: Dermatology

## 2024-01-18 DIAGNOSIS — L2089 Other atopic dermatitis: Secondary | ICD-10-CM

## 2024-01-18 MED ORDER — CLOBETASOL PROPIONATE 0.05 % EX OINT: 1.0000 | TOPICAL_OINTMENT | Freq: Two times a day (BID) | CUTANEOUS | 3 refills | Status: AC

## 2024-01-18 NOTE — Telephone Encounter (Signed)
 Received notification from [facility] regarding a prior authorization for CIMZIA . Authorization has been APPROVED from 01/18/24 to 01/17/25. Approval letter sent to scan center.  Patient can continue to fill through  [facility]  Authorization # [id number]  Geraldene Kleine, PharmD, MPH, BCPS, CPP Clinical Pharmacist (Rheumatology and Pulmonology)

## 2024-01-18 NOTE — Patient Instructions (Addendum)
 Hello Meghan Arnold,  Thank you for visiting today. Here is a summary of the key instructions:  - Medications:   - Continue alternating clobetasol  and tacrolimus  every 2 weeks   - New prescription for clobetasol  cream will be sent  - Treatment Plan:   - Starting paperwork for Dupixent (biologic injection)   - I will Consult with rheumatology regarding potential interaction or contraindications with proposed Dupixent therapy while you're also taking Cimzia    - Once approved by Rheumatology and your insurance, you will receive injection training   - First dose: 2 injections (one by the doctor, one by you)   - After first dose: 1 injection every 2 weeks at home   - Monthly supply will be mailed to you (2 syringes per month)  - Home Care:   - Keep wearing gloves when using dish detergent and cleaning products   Please reach out if you have any questions or concerns.  Warm regards,  Dr. Louana Roup, Dermatology Important Information  Due to recent changes in healthcare laws, you may see results of your pathology and/or laboratory studies on MyChart before the doctors have had a chance to review them. We understand that in some cases there may be results that are confusing or concerning to you. Please understand that not all results are received at the same time and often the doctors may need to interpret multiple results in order to provide you with the best plan of care or course of treatment. Therefore, we ask that you please give us  2 business days to thoroughly review all your results before contacting the office for clarification. Should we see a critical lab result, you will be contacted sooner.   If You Need Anything After Your Visit  If you have any questions or concerns for your doctor, please call our main line at [phone] If no one answers, please leave a voicemail as directed and we will return your call as soon as possible. Messages left after 4 pm will be answered the  following business day.   You may also send us  a message via MyChart. We typically respond to MyChart messages within 1-2 business days.  For prescription refills, please ask your pharmacy to contact our office. Our fax number is [phone].  If you have an urgent issue when the clinic is closed that cannot wait until the next business day, you can page your doctor at the number below.    Please note that while we do our best to be available for urgent issues outside of office hours, we are not available 24/7.   If you have an urgent issue and are unable to reach us , you may choose to seek medical care at your doctor's office, retail clinic, urgent care center, or emergency room.  If you have a medical emergency, please immediately call 911 or go to the emergency department. In the event of inclement weather, please call our main line at [phone] for an update on the status of any delays or closures.  Dermatology Medication Tips: Please keep the boxes that topical medications come in in order to help keep track of the instructions about where and how to use these. Pharmacies typically print the medication instructions only on the boxes and not directly on the medication tubes.   If your medication is too expensive, please contact our office at [phone] or send us  a message through MyChart.   We are unable to tell what your co-pay for medications will be in advance  as this is different depending on your insurance coverage. However, we may be able to find a substitute medication at lower cost or fill out paperwork to get insurance to cover a needed medication.   If a prior authorization is required to get your medication covered by your insurance company, please allow us  1-2 business days to complete this process.  Drug prices often vary depending on where the prescription is filled and some pharmacies may offer cheaper prices.  The website www.goodrx.com contains coupons for  medications through different pharmacies. The prices here do not account for what the cost may be with help from insurance (it may be cheaper with your insurance), but the website can give you the price if you did not use any insurance.  - You can print the associated coupon and take it with your prescription to the pharmacy.  - You may also stop by our office during regular business hours and pick up a GoodRx coupon card.  - If you need your prescription sent electronically to a different pharmacy, notify our office through [facility] or by phone at [phone]

## 2024-01-18 NOTE — Progress Notes (Addendum)
 Follow-Up Visit   Subjective  Meghan Arnold is a 38 y.o. female who presents for the following: Hand Dermatitis   Meghan Arnold, a female patient with a history of atopic dermatitis and rheumatoid arthritis, presents for follow-up of hand eczema. She reports improvement in her hand condition but notes it is not completely clear despite two months of continuous treatment.  The patient has been alternating between clobetasol  and tacrolimus  for her hands as prescribed during her last visit at the end of February. She has not had the opportunity to stop treatment to assess the natural course of her condition. Meghan Arnold works with cotton towels, primarily in hair care, which involves mixing and washing. This occupation makes it challenging to keep her hands dry and wear gloves effectively, as water often gets inside the gloves. She has been out of work for a couple of weeks due to a foot issue, which has allowed her hands some respite from constant water exposure.  At home, Meghan Arnold reports adherence to recommendations, including wearing gloves and avoiding dish detergent and cleaning products. Despite these measures and the prescribed treatment regimen, her hands have not achieved complete clearance. The persistent symptoms are affecting her ability to work in her profession.  Meghan Arnold is currently breastfeeding her 21-month-old child and is taking Cimzia  injections for her rheumatoid arthritis. She expresses interest in trying Dupixent as a potential next step in managing her atopic dermatitis, as discussed in her previous visit.  The following portions of the chart were reviewed this encounter and updated as appropriate: medications, allergies, medical history  Review of Systems:  No other skin or systemic complaints except as noted in HPI or Assessment and Plan.  Objective  Well appearing patient in no apparent distress; mood and affect are within normal limits.  A focused examination  was performed of the following areas:   Relevant exam findings are noted in the Assessment and Plan.    Assessment & Plan  Atopic DERMATITIS of Hands Exam Scaly  plaques +/- fissures BSA: 3% but includes a special site of the hands, IGA: 3    Assessment: Scaly erythematous plaques on dorsal hands and fingers, consistent with atopic dermatitis. Patient has been alternating clobetasol  and tacrolimus  for 2 months with inadequate response. 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 Previous attempts at glove use have been ineffective due to water penetration. Chronic nature and inadequate response to topical treatments warrant consideration of systemic therapy.   Treatment Plan - Consult with rheumatology regarding concurrent use of Dupixent and Cimzia  - Discussed starting Dupixent Injections , we will start paperwork today and informed patient that it can take up to 3 weeks  - Explained that Dupixent Injections will be done at home after we do Dupixent Injection Teaching in office. - Continue to use Clobetasol  Ointment and Tacrolimus  for flares    Recommend mild soap and moisturizing cream with hand washing.    Plan: Dupixent Initiation (pending Rheum Approval) Indications:  Patient isn't a candidate for systemic therapy with methotrexate or cyclosporine. Patient has been unresponsive to aggressive topical therapy.  Failed Treatments: Topical Steroids and Topical Protopic   Treatment Protocol: 600 mg [place] day 0 then 300 mg [place] every other week  Specific Contraindications Cyclosporine is contraindicated because the patient will not be able to complete the necessary follow-up labs. Methotrexate is contraindicated because the patient will not be able to complete the necessary follow-up labs. Phototherapy is contraindicated  because the patient lives too far from the treatment location.   Dupixent Counseling: I  discussed with the patient the risks of dupilumab including but not limited to eye infection and irritation, cold sores, injection site reactions, worsening of asthma, allergic reactions and increased risk of parasitic infection. Live vaccines should be avoided while taking dupilumab. Dupilumab will also interact with certain medications such as warfarin and cyclosporine. The patient understands that monitoring is required and they must alert us  or the primary physician if symptoms of infection or other concerning signs are noted.  Dupixent Monitoring: There is no laboratory monitoring requirement with Dupixent.      No follow-ups on file.  Meghan Arnold, CMA, am acting as scribe for [employer], DO.   Documentation: I have reviewed the above documentation for accuracy and completeness, and I agree with the above.  Delon Lenis, DO

## 2024-01-18 NOTE — Telephone Encounter (Signed)
 Submitted a Prior Authorization RENEWAL request to [facility] for CIMZIA  via CoverMyMeds. Will update once we receive a response.  Key: [id number]

## 2024-01-19 ENCOUNTER — Ambulatory Visit (INDEPENDENT_AMBULATORY_CARE_PROVIDER_SITE_OTHER)

## 2024-01-19 ENCOUNTER — Ambulatory Visit (INDEPENDENT_AMBULATORY_CARE_PROVIDER_SITE_OTHER): Payer: Medicaid Other | Admitting: Podiatry

## 2024-01-19 ENCOUNTER — Encounter: Payer: Self-pay | Admitting: Podiatry

## 2024-01-19 VITALS — Ht 65.0 in | Wt 158.0 lb

## 2024-01-19 DIAGNOSIS — M2011 Hallux valgus (acquired), right foot: Secondary | ICD-10-CM

## 2024-01-19 NOTE — Progress Notes (Signed)
   Chief Complaint  Patient presents with   Routine Post Op    POV #3 DOS 12/24/23 --- BUNIONECTOMY WITH OSTEOTOMY RIGHT    Subjective:  Patient presents today status post bunionectomy with distal first metatarsal osteotomy of the right foot.  DOS: 12/24/2023.  Continues to do well.  WBAT CAM boot as instructed.  No new complaints  Past Medical History:  Diagnosis Date   Benign tumor of ovary 2013   Bunion of right foot    Chlamydia    Eczema    Family history of breast cancer    History of methicillin resistant staphylococcus aureus (MRSA) 2008   Hypertension    Raynaud's phenomenon without gangrene    mgmt rheum Dr Liisa Reeves   Rheumatoid arthritis (HCC) 2017   Vitamin D  deficiency    Wears contact lenses     Past Surgical History:  Procedure Laterality Date   BUNIONECTOMY Right 12/24/2023   Procedure: Claudine Cullens;  Surgeon: Dot Gazella, DPM;  Location: [facility];  Service: Orthopedics/Podiatry;  Laterality: Right;   LAPAROSCOPIC SALPINGOOPHERECTOMY  2013   done by dr. Andree Bane , believes right side laterality    LAPAROSCOPIC UNILATERAL SALPINGECTOMY Right 12/01/2017   Procedure: LAPAROSCOPIC LYSIS OF ADHESIONS, RIGHT TUBAL OVARIOLYSIS, RIGHT OVARIAN CYST ASPIRATION,CHROMOPERTUBATION, RIGHT FIMBRIOPLASTY;  Surgeon: Yalcinkaya, Tamer, MD;  Location: [facility];  Service: Gynecology;  Laterality: Right;   LAPAROSCOPY     OVARIAN CYST REMOVAL  08/2012   dr Cleora Daft at [facility] in [place] ; has abdominal insision     No Known Allergies  Objective/Physical Exam Neurovascular status intact.  Incision nicely healed.  Minimal edema noted.  Overall well-healing surgical foot with the toe in good rectus alignment  Radiographic Exam RT foot 12/29/2023:  Orthopedic hardware and osteotomies sites appear to be stable with routine healing.  Adequate reduction of the IM angle with overall good alignment of the first ray  Assessment: 1. s/p bunionectomy  with distal first metatarsal osteotomy right. DOS: 12/24/2023   Plan of Care:  -Patient was evaluated.  - Patient may now transition out of the cam boot into good supportive tennis shoes and sneakers. -Slowly increase activity over the following 2 weeks -Return to clinic 4 weeks follow-up x-ray  *Cosmetologist. Can take the time off necessary postop   Dot Gazella, DPM [facility]  Dr. Dot Gazella, DPM    [address] [place], [place] [address]                Office [phone]  Fax [phone]

## 2024-02-14 ENCOUNTER — Encounter: Admitting: Podiatry

## 2024-02-23 ENCOUNTER — Ambulatory Visit (INDEPENDENT_AMBULATORY_CARE_PROVIDER_SITE_OTHER)

## 2024-02-23 ENCOUNTER — Ambulatory Visit (INDEPENDENT_AMBULATORY_CARE_PROVIDER_SITE_OTHER): Admitting: Podiatry

## 2024-02-23 DIAGNOSIS — M2011 Hallux valgus (acquired), right foot: Secondary | ICD-10-CM

## 2024-02-23 DIAGNOSIS — M21619 Bunion of unspecified foot: Secondary | ICD-10-CM

## 2024-02-23 NOTE — Progress Notes (Signed)
   Chief Complaint  Patient presents with   Routine Post Op    Follow-up post op bunion surgery. Pt states no pain.    Subjective:  Patient presents today status post bunionectomy with distal first metatarsal osteotomy of the right foot.  DOS: 12/24/2023.  Doing well.  No pain.  No new complaints  Past Medical History:  Diagnosis Date   Benign tumor of ovary 2013   Bunion of right foot    Chlamydia    Eczema    Family history of breast cancer    History of methicillin resistant staphylococcus aureus (MRSA) 2008   Hypertension    Raynaud's phenomenon without gangrene    mgmt rheum Dr Liisa Reeves   Rheumatoid arthritis (HCC) 2017   Vitamin D  deficiency    Wears contact lenses     Past Surgical History:  Procedure Laterality Date   BUNIONECTOMY Right 12/24/2023   Procedure: Claudine Cullens;  Surgeon: Dot Gazella, DPM;  Location: [facility];  Service: Orthopedics/Podiatry;  Laterality: Right;   LAPAROSCOPIC SALPINGOOPHERECTOMY  2013   done by dr. Andree Bane , believes right side laterality    LAPAROSCOPIC UNILATERAL SALPINGECTOMY Right 12/01/2017   Procedure: LAPAROSCOPIC LYSIS OF ADHESIONS, RIGHT TUBAL OVARIOLYSIS, RIGHT OVARIAN CYST ASPIRATION,CHROMOPERTUBATION, RIGHT FIMBRIOPLASTY;  Surgeon: Yalcinkaya, Tamer, MD;  Location: [facility];  Service: Gynecology;  Laterality: Right;   LAPAROSCOPY     OVARIAN CYST REMOVAL  08/2012   dr Cleora Daft at [facility] in [place] ; has abdominal insision     No Known Allergies  Objective/Physical Exam Neurovascular status intact.  Incision nicely healed.  No edema.  Good range of motion of first MTP.  No pain with palpation  Radiographic Exam RT foot 02/23/2024:  Osteotomy site hardware intact.  Complete osseous union of the osteotomy site noted.  Good alignment of the first ray.    Assessment: 1. s/p bunionectomy with distal first metatarsal osteotomy right. DOS: 12/24/2023   Plan of Care:  -Patient was  evaluated.  - Resume full activity no restrictions in good supportive tennis shoes and sneakers -Return to clinic PRN  *Cosmetologist.   Dot Gazella, DPM [facility]  Dr. Dot Gazella, DPM    [address] [place], [place] [address]                Office [phone]  Fax [phone]

## 2024-02-24 NOTE — Telephone Encounter (Signed)
 S/w pt and advised form completed and ready for pick up She will come in office 02/25/24 for [employer] form. RTW 02/23/24.

## 2024-03-02 ENCOUNTER — Encounter: Payer: Self-pay | Admitting: Rheumatology

## 2024-03-02 NOTE — Telephone Encounter (Signed)
 Advised patient that she will need a nurse visit or an office to review her chart and answer all these questions .
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
## 2024-03-08 ENCOUNTER — Ambulatory Visit: Attending: Physician Assistant | Admitting: Physician Assistant

## 2024-03-08 ENCOUNTER — Encounter: Payer: Self-pay | Admitting: Physician Assistant

## 2024-03-08 VITALS — BP 123/75 | HR 78 | Resp 16 | Ht 65.0 in | Wt 157.6 lb

## 2024-03-08 DIAGNOSIS — E559 Vitamin D deficiency, unspecified: Secondary | ICD-10-CM | POA: Diagnosis present

## 2024-03-08 DIAGNOSIS — Z79899 Other long term (current) drug therapy: Secondary | ICD-10-CM | POA: Insufficient documentation

## 2024-03-08 DIAGNOSIS — I7300 Raynaud's syndrome without gangrene: Secondary | ICD-10-CM | POA: Insufficient documentation

## 2024-03-08 DIAGNOSIS — M21612 Bunion of left foot: Secondary | ICD-10-CM | POA: Insufficient documentation

## 2024-03-08 DIAGNOSIS — M21611 Bunion of right foot: Secondary | ICD-10-CM | POA: Diagnosis present

## 2024-03-08 DIAGNOSIS — M0579 Rheumatoid arthritis with rheumatoid factor of multiple sites without organ or systems involvement: Secondary | ICD-10-CM | POA: Insufficient documentation

## 2024-03-08 DIAGNOSIS — M2141 Flat foot [pes planus] (acquired), right foot: Secondary | ICD-10-CM | POA: Insufficient documentation

## 2024-03-08 DIAGNOSIS — Z111 Encounter for screening for respiratory tuberculosis: Secondary | ICD-10-CM | POA: Insufficient documentation

## 2024-03-08 DIAGNOSIS — L309 Dermatitis, unspecified: Secondary | ICD-10-CM | POA: Diagnosis present

## 2024-03-08 DIAGNOSIS — M2142 Flat foot [pes planus] (acquired), left foot: Secondary | ICD-10-CM | POA: Insufficient documentation

## 2024-03-09 ENCOUNTER — Ambulatory Visit: Payer: Self-pay | Admitting: Physician Assistant

## 2024-03-09 ENCOUNTER — Ambulatory Visit: Admitting: Physician Assistant

## 2024-03-09 LAB — CBC WITH DIFFERENTIAL/PLATELET
Absolute Lymphocytes: 1824 {cells}/uL (ref 850–3900)
Absolute Monocytes: 380 {cells}/uL (ref 200–950)
Basophils Absolute: 38 {cells}/uL (ref 0–200)
Basophils Relative: 1 %
Eosinophils Absolute: 319 {cells}/uL (ref 15–500)
Eosinophils Relative: 8.4 %
HCT: 37.6 % (ref 35.0–45.0)
Hemoglobin: 12.1 g/dL (ref 11.7–15.5)
MCH: 29.6 pg (ref 27.0–33.0)
MCHC: 32.2 g/dL (ref 32.0–36.0)
MCV: 91.9 fL (ref 80.0–100.0)
MPV: 9.5 fL (ref 7.5–12.5)
Monocytes Relative: 10 %
Neutro Abs: 1239 {cells}/uL — ABNORMAL LOW (ref 1500–7800)
Neutrophils Relative %: 32.6 %
Platelets: 244 10*3/uL (ref 140–400)
RBC: 4.09 10*6/uL (ref 3.80–5.10)
RDW: 12.1 % (ref 11.0–15.0)
Total Lymphocyte: 48 %
WBC: 3.8 10*3/uL (ref 3.8–10.8)

## 2024-03-09 LAB — COMPREHENSIVE METABOLIC PANEL WITH GFR
AG Ratio: 1.3 (calc) (ref 1.0–2.5)
ALT: 5 U/L — ABNORMAL LOW (ref 6–29)
AST: 10 U/L (ref 10–30)
Albumin: 4.4 g/dL (ref 3.6–5.1)
Alkaline phosphatase (APISO): 50 U/L (ref 31–125)
BUN: 12 mg/dL (ref 7–25)
CO2: 28 mmol/L (ref 20–32)
Calcium: 9.3 mg/dL (ref 8.6–10.2)
Chloride: 104 mmol/L (ref 98–110)
Creat: 0.67 mg/dL (ref 0.50–0.97)
Globulin: 3.3 g/dL (ref 1.9–3.7)
Glucose, Bld: 83 mg/dL (ref 65–99)
Potassium: 4.3 mmol/L (ref 3.5–5.3)
Sodium: 139 mmol/L (ref 135–146)
Total Bilirubin: 0.3 mg/dL (ref 0.2–1.2)
Total Protein: 7.7 g/dL (ref 6.1–8.1)
eGFR: 114 mL/min/{1.73_m2} (ref >=60)

## 2024-03-09 NOTE — Progress Notes (Signed)
 Absolute neutrophils remain borderline low but have improved. Rest of CBC WNL.  ALT is low. Rest of CMP WNL.  We will continue to monitor.

## 2024-03-28 ENCOUNTER — Other Ambulatory Visit: Payer: Self-pay | Admitting: Physician Assistant

## 2024-03-28 DIAGNOSIS — Z79899 Other long term (current) drug therapy: Secondary | ICD-10-CM

## 2024-03-28 DIAGNOSIS — M0579 Rheumatoid arthritis with rheumatoid factor of multiple sites without organ or systems involvement: Secondary | ICD-10-CM

## 2024-03-28 NOTE — Telephone Encounter (Signed)
 Last Fill: 12/29/2023  Labs: 03/08/2024 Absolute neutrophils remain borderline low but have improved. Rest of CBC WNL. ALT is low. Rest of CMP WNL.  TB Gold: 06/10/2023 Neg    Next Visit: 08/14/2024  Last Visit: 03/08/2024  IK:Myzlfjunpi arthritis involving multiple sites with positive rheumatoid factor   Current Dose per office note 03/08/2024: Cimzia  200 mg subcutaneous injection every 2 weeks.    Okay to refill Cimzia ?

## 2024-05-01 ENCOUNTER — Encounter: Payer: Self-pay | Admitting: Dermatology

## 2024-05-01 NOTE — Telephone Encounter (Signed)
 HI Jetta,  Can you check to se if Rosina started and sent the paperwork for this patient.  Please see when this patient has a follow up scheduled so we can get her started on Dupixent.  Thanks!

## 2024-05-02 ENCOUNTER — Ambulatory Visit: Admitting: Physician Assistant

## 2024-05-03 NOTE — Telephone Encounter (Signed)
 Note has been updated/  Dr D

## 2024-05-22 ENCOUNTER — Ambulatory Visit: Admitting: Dermatology

## 2024-05-24 ENCOUNTER — Encounter: Payer: Self-pay | Admitting: Dermatology

## 2024-05-24 ENCOUNTER — Ambulatory Visit: Admitting: Dermatology

## 2024-05-24 DIAGNOSIS — L209 Atopic dermatitis, unspecified: Secondary | ICD-10-CM

## 2024-05-24 DIAGNOSIS — L299 Pruritus, unspecified: Secondary | ICD-10-CM | POA: Diagnosis not present

## 2024-05-24 DIAGNOSIS — L309 Dermatitis, unspecified: Secondary | ICD-10-CM

## 2024-05-24 MED ORDER — DUPIXENT 300 MG/2ML ~~LOC~~ SOAJ
600.0000 mg | Freq: Once | SUBCUTANEOUS | 0 refills | Status: AC
Start: 2024-05-24 — End: 2024-05-24

## 2024-05-24 MED ORDER — DUPIXENT 300 MG/2ML ~~LOC~~ SOAJ: 300.0000 mg | SUBCUTANEOUS | 11 refills | Status: AC

## 2024-05-24 NOTE — Progress Notes (Signed)
 Follow-Up Visit   Subjective  Meghan Arnold is a 38 y.o. female established patient who presents for FOLLOW UP on the diagnoses listed below:  Patient was last evaluated on 01/18/24.   Hand Derm/Eczema: Continue alternating clobetasol  & tacrolimus  for flares. Started process for Dupixent . Patient reports sxs are better - however, she stated that she has to alternate frequently throught out the day. She is slightly flared today, stating that it has been improving over the last 2 days. Itch/Pain Score 8 out of 10.   Are you nursing, pregnant or trying to conceive? Yes, nursing   The following portions of the chart were reviewed this encounter and updated as appropriate: medications, allergies, medical history  Review of Systems:  No other skin or systemic complaints except as noted in HPI or Assessment and Plan.  Objective  Well appearing patient in no apparent distress; mood and affect are within normal limits.   A focused examination was performed of the following areas: B?L hands   Relevant exam findings are noted in the Assessment and Plan.                Assessment & Plan   Eczema (hands and legs)  and Pruritus Exam: BSA 12% scaly plaques on B/L hands   Not at goal  - Assessment: Patient has been using alternating clobetasol  and tacrolimus  with minimal relief. Considering escalation to systemic therapy with Dupixent , which has been approved by the patient's rheumatologist. Dupixent  targets a different arm of the immune system related to eczema and does not suppress the immune system like Cimzia , making it compatible for concurrent use.  - Plan:    Initiate Dupixent  therapy with patient to sign consent form in person    Submit paperwork for approval with expected 2-3 weeks for processing    Start with samples once approved with initial dosing of 2 injections on first day, then 1 injection every two weeks    Medication to be delivered monthly to patient's home  via autoinjector    Provide samples of Zoryve for use once daily at night, can increase to twice daily if needed    Apply pea-sized amount to each hand with 4 sample tubes dispensed to last until Dupixent  initiation    Educate patient to wear gloves when washing dishes and cleaning    Send MyChart message if no update received within 2 weeks  Follow-up to monitor for response to new treatment regimen.  Treatment Plan: - D/C alternating clobetasol  & tacrolimus . Will replace with Zorvye - use pea size amount to apply to hands. Samples provided. - Recommend mild soap and moisturizing cream with hand washing.  - Completed Dupixent  forms today.     Plan: Dupixent  Initiation Indications:  Patient isn't a candidate for systemic therapy with methotrexate or cyclosporine. Patient has been unresponsive to aggressive topical therapy.  Failed Treatments: Topical Steroids and Topical Protopic   Treatment Protocol: 600 mg [place] day 0 then 300 mg [place] every other week  Specific Contraindications Cyclosporine is contraindicated because the patient will not be able to complete the necessary follow-up labs. Methotrexate is contraindicated because the patient will not be able to complete the necessary follow-up labs. Phototherapy is contraindicated because the patient lives too far from the treatment location.   Dupixent  Counseling: I discussed with the patient the risks of dupilumab  including but not limited to eye infection and irritation, cold sores, injection site reactions, worsening of asthma, allergic reactions and increased risk of parasitic infection. Live  vaccines should be avoided while taking dupilumab . Dupilumab  will also interact with certain medications such as warfarin and cyclosporine. The patient understands that monitoring is required and they must alert us  or the primary physician if symptoms of infection or other concerning signs are noted.  Dupixent  Monitoring: There is no laboratory  monitoring requirement with Dupixent .    No follow-ups on file.   Documentation: I have reviewed the above documentation for accuracy and completeness, and I agree with the above.  I, Shirron Maranda, CMA, am acting as scribe for [employer], DO.   Delon Lenis, DO

## 2024-05-24 NOTE — Patient Instructions (Addendum)
 Date: Wed May 24 2024  Hello [place],  Thank you for visiting today. Here is a summary of the key instructions:  - Medications:   - Start using Zoryve once a day at night   - Use one pea-sized amount for each hand   - Can use twice a day if needed   -  Dupixent  treatment plan (once insurance approves):     - First day: 2 injections     - Then: 1 injection every two weeks     - Will be delivered to your home monthly  - Treatment Areas:   - Dupixent  approval process:     - Sign the form in person     - Wait 2-3 weeks for approval     - We will start with samples once approved  - Follow-up:   - Send a MyChart message if you don't hear from us  within 2 weeks about Dupixent  approval  - Other Instructions:   - Wear gloves when washing dishes and cleaning  Please reach out if you have any questions or concerns.  Warm regards,  Dr. Delon Lenis Dermatology  Important Information  Due to recent changes in healthcare laws, you may see results of your pathology and/or laboratory studies on MyChart before the doctors have had a chance to review them. We understand that in some cases there may be results that are confusing or concerning to you. Please understand that not all results are received at the same time and often the doctors may need to interpret multiple results in order to provide you with the best plan of care or course of treatment. Therefore, we ask that you please give us  2 business days to thoroughly review all your results before contacting the office for clarification. Should we see a critical lab result, you will be contacted sooner.   If You Need Anything After Your Visit  If you have any questions or concerns for your doctor, please call our main line at [phone] If no one answers, please leave a voicemail as directed and we will return your call as soon as possible. Messages left after 4 pm will be answered the following business day.   You may also send us  a  message via MyChart. We typically respond to MyChart messages within 1-2 business days.  For prescription refills, please ask your pharmacy to contact our office. Our fax number is [phone].  If you have an urgent issue when the clinic is closed that cannot wait until the next business day, you can page your doctor at the number below.    Please note that while we do our best to be available for urgent issues outside of office hours, we are not available 24/7.   If you have an urgent issue and are unable to reach us , you may choose to seek medical care at your doctor's office, retail clinic, urgent care center, or emergency room.  If you have a medical emergency, please immediately call 911 or go to the emergency department. In the event of inclement weather, please call our main line at [phone] for an update on the status of any delays or closures.  Dermatology Medication Tips: Please keep the boxes that topical medications come in in order to help keep track of the instructions about where and how to use these. Pharmacies typically print the medication instructions only on the boxes and not directly on the medication tubes.   If your medication is too expensive, please contact  our office at [phone] or send us  a message through MyChart.   We are unable to tell what your co-pay for medications will be in advance as this is different depending on your insurance coverage. However, we may be able to find a substitute medication at lower cost or fill out paperwork to get insurance to cover a needed medication.   If a prior authorization is required to get your medication covered by your insurance company, please allow us  1-2 business days to complete this process.  Drug prices often vary depending on where the prescription is filled and some pharmacies may offer cheaper prices.  The website www.goodrx.com contains coupons for medications through different pharmacies. The prices here  do not account for what the cost may be with help from insurance (it may be cheaper with your insurance), but the website can give you the price if you did not use any insurance.  - You can print the associated coupon and take it with your prescription to the pharmacy.  - You may also stop by our office during regular business hours and pick up a GoodRx coupon card.  - If you need your prescription sent electronically to a different pharmacy, notify our office through [facility] or by phone at [phone]

## 2024-06-05 ENCOUNTER — Encounter: Payer: Self-pay | Admitting: Dermatology

## 2024-06-13 ENCOUNTER — Encounter: Payer: Self-pay | Admitting: Dermatology

## 2024-06-13 ENCOUNTER — Ambulatory Visit: Admitting: Dermatology

## 2024-06-13 VITALS — BP 142/92 | HR 75

## 2024-06-13 DIAGNOSIS — L308 Other specified dermatitis: Secondary | ICD-10-CM

## 2024-06-13 DIAGNOSIS — L209 Atopic dermatitis, unspecified: Secondary | ICD-10-CM

## 2024-06-13 DIAGNOSIS — L299 Pruritus, unspecified: Secondary | ICD-10-CM

## 2024-06-13 MED ORDER — DUPILUMAB 300 MG/2ML ~~LOC~~ SOAJ
600.0000 mg | Freq: Once | SUBCUTANEOUS | Status: AC
  Administered 2024-06-13: 600 mg via SUBCUTANEOUS

## 2024-06-13 NOTE — Progress Notes (Signed)
   Follow-Up Visit   Subjective  Meghan Arnold is a 38 y.o. female who presents for the following:Dupixent  Injection Teaching Hand Eczema  Patient present today for follow up visit for Dupixent  Injection Teaching. Patient was last evaluated on 05/24/24. At this visit patient was prescribed Dupixent . Patient reports sxs are unchanged.Patient rates her itch today 9 out of 10.  Patient denies medication changes. Patient not actively pregnant or trying to conceive.   The following portions of the chart were reviewed this encounter and updated as appropriate: medications, allergies, medical history  Review of Systems:  No other skin or systemic complaints except as noted in HPI or Assessment and Plan.  Objective  Well appearing patient in no apparent distress; mood and affect are within normal limits.  A focused examination was performed of the following areas: Hands  Relevant exam findings are noted in the Assessment and Plan.                     Assessment & Plan   HAND DERMATITIS Exam: Scaly pink plaques +/- fissures  Flared  Hand Dermatitis is a chronic type of eczema that can come and go on the hands and fingers.  While there is no cure, the rash and symptoms can be managed with topical prescription medications, and for more severe cases, with systemic medications.  Recommend mild soap and routine use of moisturizing cream after handwashing.  Minimize soap/water exposure when possible.    Treatment Plan: - Patient instructed how to complete Dupixent  Injections with Demonstration Pen - Patient completed Injections while in office at B/L Anterior Thighs, Mild erythema noted at injection site.   - Advised to continue topicals (Zoryve) for break through Eczema Flares - Patient tolerated well and demonstrated understanding - Recommend mild soap and moisturizing cream with hand washing. - Plan to follow up in 5 months   DUPIXENT  NDC: 9975-4084-79 LOT: [id number] EXP:  09/27/2025  ATOPIC DERMATITIS, UNSPECIFIED TYPE   Related Medications Dupilumab  (DUPIXENT ) 300 MG/2ML SOAJ Inject 300 mg into the skin every 14 (fourteen) days. Starting at day 15 for maintenance. Dupilumab  SOAJ 600 mg  PRURITUS   Related Medications Dupilumab  SOAJ 600 mg   Return in about 5 months (around 11/13/2024) for Hand Eczema F/U.  I, Jetta Ager, am acting as Neurosurgeon for [employer], DO.  Documentation: I have reviewed the above documentation for accuracy and completeness, and I agree with the above.  Delon Lenis, DO

## 2024-06-13 NOTE — Patient Instructions (Signed)

## 2024-06-15 ENCOUNTER — Other Ambulatory Visit: Payer: Self-pay

## 2024-06-15 ENCOUNTER — Emergency Department (HOSPITAL_COMMUNITY)
Admission: EM | Admit: 2024-06-15 | Discharge: 2024-06-16 | Disposition: A | Discharge status: Home / Self Care | Attending: Emergency Medicine | Admitting: Emergency Medicine

## 2024-06-15 DIAGNOSIS — T63441A Toxic effect of venom of bees, accidental (unintentional), initial encounter: Secondary | ICD-10-CM | POA: Diagnosis not present

## 2024-06-15 DIAGNOSIS — M25572 Pain in left ankle and joints of left foot: Secondary | ICD-10-CM | POA: Diagnosis present

## 2024-06-15 DIAGNOSIS — T63481A Toxic effect of venom of other arthropod, accidental (unintentional), initial encounter: Secondary | ICD-10-CM

## 2024-06-15 NOTE — ED Triage Notes (Signed)
 PT WAS BITTEN BY SOMETHING OUTSIDE YESTERDAY. AREA IS RED AND SWOLLEN. PT STATES THAT IT IS SO PAINFUL THAT SHE CAN BARELY WALK. PEDAL PULSE +3. ANKLE IS RED AND SWOLLEN

## 2024-06-16 MED ORDER — NAPROXEN 500 MG PO TABS
500.0000 mg | ORAL_TABLET | Freq: Two times a day (BID) | ORAL | 0 refills | Status: AC
Start: 2024-06-16 — End: ?

## 2024-06-16 MED ORDER — DOXYCYCLINE HYCLATE 100 MG PO TABS
100.0000 mg | ORAL_TABLET | Freq: Once | ORAL | Status: AC
  Administered 2024-06-16: 100 mg via ORAL
  Filled 2024-06-16: qty 1

## 2024-06-16 MED ORDER — NAPROXEN 250 MG PO TABS
500.0000 mg | ORAL_TABLET | Freq: Once | ORAL | Status: AC
  Administered 2024-06-16: 500 mg via ORAL
  Filled 2024-06-16: qty 2

## 2024-06-16 MED ORDER — DOXYCYCLINE HYCLATE 100 MG PO CAPS: 100.0000 mg | ORAL_CAPSULE | Freq: Two times a day (BID) | ORAL | 0 refills | Status: AC

## 2024-06-16 NOTE — Discharge Instructions (Signed)
 Apply ice to the area to limit swelling/inflammation. Take Naproxen  for pain Use doxycycline  as prescribed to prevent infection. We advise follow up with your primary care doctor for recheck in 1 week. It may be helpful to use crutches when moving around to prevent from putting weight on your left ankle and limit discomfort.

## 2024-06-16 NOTE — ED Provider Notes (Signed)
 [facility] AT [facility] Provider Note   CSN: [id number] Arrival date & time: 06/15/24  2322     Patient presents with: Insect Bite (LEFT ANKLE)   Meghan Arnold is a 38 y.o. female.   38 year old female presents to the emergency department for evaluation of left ankle pain.  She states that she was walking outside yesterday when she felt something bite her inner, posterior left ankle.  Area has become more swollen and painful today.  She endorses some associated redness.  Ambulation aggravates her discomfort.  She has not taken anything for pain.  No associated numbness or extremity weakness.  No drainage from the area.  The history is provided by the patient. No language interpreter was used.       Prior to Admission medications   Medication Sig Start Date End Date Taking? Authorizing Provider  doxycycline  (VIBRAMYCIN ) 100 MG capsule Take 1 capsule (100 mg total) by mouth 2 (two) times daily for 7 days. 06/16/24 06/23/24 Yes Keith Sor, PA-C  naproxen  (NAPROSYN ) 500 MG tablet Take 1 tablet (500 mg total) by mouth 2 (two) times daily. 06/16/24  Yes Keith Sor, PA-C  cetirizine (ZYRTEC) 10 MG tablet Take 10 mg by mouth daily. 02/15/24   [provider]  CIMZIA , 2 SYRINGE, 200 MG/ML prefilled syringe INJECT 1 SYRINGE UNDER THE SKIN EVERY 2 WEEKS 03/28/24   Cheryl Waddell HERO, PA-C  clobetasol  ointment (TEMOVATE ) 0.05 % Apply 1 Application topically 2 (two) times daily. 01/18/24   Alm Delon SAILOR, DO  Dupilumab  (DUPIXENT ) 300 MG/2ML SOAJ Inject 300 mg into the skin every 14 (fourteen) days. Starting at day 15 for maintenance. 05/24/24   Alm Delon SAILOR, DO  labetalol  (NORMODYNE ) 100 MG tablet Take 100 mg by mouth daily.    [provider]  oxyCODONE -acetaminophen  (PERCOCET) 5-325 MG tablet Take 1 tablet by mouth every 4 (four) hours as needed for severe pain (pain score 7-10). 12/24/23 12/23/24  Janit Thresa HERO, DPM  Prenatal Vit-Fe  Fumarate-FA (PRENATAL MULTIVITAMIN) TABS tablet Take 1 tablet by mouth daily at 12 noon.    [provider]  tacrolimus  (PROTOPIC ) 0.1 % ointment Apply topically 2 (two) times daily. Alternate with clobetasol  every 2 weeks until clear. Repeat in needed for flares. 11/30/23   Alm Delon SAILOR, DO    Allergies: Patient has no known allergies.    Review of Systems Ten systems reviewed and are negative for acute change, except as noted in the HPI.    Updated Vital Signs BP 128/76 (BP Location: Right Arm)   Pulse 82   Temp 99 F (37.2 C)   Resp 16   Ht 5' 5 (1.651 m)   Wt 71.7 kg   LMP 06/06/2024   SpO2 99%   BMI 26.29 kg/m   Physical Exam Vitals and nursing note reviewed.  Constitutional:      General: She is not in acute distress.    Appearance: She is well-developed. She is not diaphoretic.  HENT:     Head: Normocephalic and atraumatic.  Eyes:     General: No scleral icterus.    Conjunctiva/sclera: Conjunctivae normal.  Cardiovascular:     Rate and Rhythm: Normal rate and regular rhythm.     Pulses: Normal pulses.     Comments: DP pulse 2+ in the LLE Pulmonary:     Effort: Pulmonary effort is normal. No respiratory distress.  Musculoskeletal:        General: Normal range of motion.  Cervical back: Normal range of motion.     Comments: Mild decrease in L ankle dorsiflexion 2/2 pain. Some swelling to the posterior and medial L ankle with mild erythema. No circumferential swelling of L ankle joint or significant heat to touch. No lymphangitic streaking or palpable joint effusion.  Skin:    General: Skin is warm and dry.     Coloration: Skin is not pale.     Findings: No rash.  Neurological:     Mental Status: She is alert and oriented to person, place, and time.     Coordination: Coordination normal.  Psychiatric:        Behavior: Behavior normal.     (all labs ordered are listed, but only abnormal results are displayed) Labs Reviewed - No data to  display  EKG: None  Radiology: No results found.   Procedures   Medications Ordered in the ED  naproxen  (NAPROSYN ) tablet 500 mg (500 mg Oral Given 06/16/24 0254)  doxycycline  (VIBRA -TABS) tablet 100 mg (100 mg Oral Given 06/16/24 0254)                                    Medical Decision Making Risk Prescription drug management.   This patient presents to the ED for concern of L ankle pain, this involves an extensive number of treatment options, and is a complaint that carries with it a high risk of complications and morbidity.  The differential diagnosis includes local reaction to insect sting vs abscess vs cellulitis vs septic joint   Co morbidities that complicate the patient evaluation  Rheumatoid arthritis    Cardiac Monitoring:  The patient was maintained on a cardiac monitor.  I personally viewed and interpreted the cardiac monitored which showed an underlying rhythm of: NSR   Medicines ordered and prescription drug management:  I ordered medication including Naproxen  for pain/inflammation and doxycycline  for infection prevention  Reevaluation of the patient after these medicines showed that the patient stayed the same I have reviewed the patients home medicines and have made adjustments as needed   Test Considered:  Xray L ankle   Problem List / ED Course:  Patient presents to the emergency department for evaluation of L ankle pain after suspected insect bite. Patient neurovascularly intact on exam. No heat to touch to the affected area; no concern for septic joint. Compartments in the affected extremity are soft. Plan for supportive management including RICE and NSAIDs; primary care follow up as needed.    Reevaluation:  After the interventions noted above, I reevaluated the patient and found that they have :stayed the same   Social Determinants of Health:  Lives independently   Dispostion:  After consideration of the diagnostic results and the  patients response to treatment, I feel that the patent would benefit from course of doxycycline  for infection coverage. Continue supportive care for icing/swelling; given Rx for NSAIDs. Return precautions discussed and provided. Patient discharged in stable condition with no unaddressed concerns.      Final diagnoses:  Local reaction to insect sting, accidental or unintentional, initial encounter    ED Discharge Orders          Ordered    doxycycline  (VIBRAMYCIN ) 100 MG capsule  2 times daily        06/16/24 0250    naproxen  (NAPROSYN ) 500 MG tablet  2 times daily        06/16/24 0250  Keith Sor, PA-C 06/16/24 9445    Jerral Meth, MD 06/16/24 [phone]

## 2024-06-21 ENCOUNTER — Ambulatory Visit: Admitting: Dermatology

## 2024-07-28 ENCOUNTER — Other Ambulatory Visit: Payer: Self-pay | Admitting: *Deleted

## 2024-07-28 DIAGNOSIS — Z111 Encounter for screening for respiratory tuberculosis: Secondary | ICD-10-CM

## 2024-07-28 DIAGNOSIS — Z79899 Other long term (current) drug therapy: Secondary | ICD-10-CM

## 2024-07-30 ENCOUNTER — Ambulatory Visit: Payer: Self-pay | Admitting: Rheumatology

## 2024-07-30 NOTE — Progress Notes (Signed)
 BC and CMP are stable.  Neutrophil's are low due to immunosuppression.  We will continue to monitor labs every 3 months.

## 2024-07-31 ENCOUNTER — Other Ambulatory Visit: Payer: Self-pay | Admitting: *Deleted

## 2024-07-31 DIAGNOSIS — Z79899 Other long term (current) drug therapy: Secondary | ICD-10-CM

## 2024-07-31 DIAGNOSIS — M0579 Rheumatoid arthritis with rheumatoid factor of multiple sites without organ or systems involvement: Secondary | ICD-10-CM

## 2024-07-31 LAB — CBC WITH DIFFERENTIAL/PLATELET
Absolute Lymphocytes: 2164 {cells}/uL (ref 850–3900)
Absolute Monocytes: 372 {cells}/uL (ref 200–950)
Basophils Absolute: 60 {cells}/uL (ref 0–200)
Basophils Relative: 1.5 %
Eosinophils Absolute: 412 {cells}/uL (ref 15–500)
Eosinophils Relative: 10.3 %
HCT: 36.7 % (ref 35.0–45.0)
Hemoglobin: 12.3 g/dL (ref 11.7–15.5)
MCH: 30.1 pg (ref 27.0–33.0)
MCHC: 33.5 g/dL (ref 32.0–36.0)
MCV: 90 fL (ref 80.0–100.0)
MPV: 9.7 fL (ref 7.5–12.5)
Monocytes Relative: 9.3 %
Neutro Abs: 992 {cells}/uL — ABNORMAL LOW (ref 1500–7800)
Neutrophils Relative %: 24.8 %
Platelets: 280 Thousand/uL (ref 140–400)
RBC: 4.08 Million/uL (ref 3.80–5.10)
RDW: 12.3 % (ref 11.0–15.0)
Total Lymphocyte: 54.1 %
WBC: 4 Thousand/uL (ref 3.8–10.8)

## 2024-07-31 LAB — COMPREHENSIVE METABOLIC PANEL WITH GFR
AG Ratio: 1.4 (calc) (ref 1.0–2.5)
ALT: 8 U/L (ref 6–29)
AST: 11 U/L (ref 10–30)
Albumin: 4.5 g/dL (ref 3.6–5.1)
Alkaline phosphatase (APISO): 47 U/L (ref 31–125)
BUN: 12 mg/dL (ref 7–25)
CO2: 26 mmol/L (ref 20–32)
Calcium: 9.2 mg/dL (ref 8.6–10.2)
Chloride: 105 mmol/L (ref 98–110)
Creat: 0.73 mg/dL (ref 0.50–0.97)
Globulin: 3.2 g/dL (ref 1.9–3.7)
Glucose, Bld: 79 mg/dL (ref 65–99)
Potassium: 4.1 mmol/L (ref 3.5–5.3)
Sodium: 139 mmol/L (ref 135–146)
Total Bilirubin: 0.3 mg/dL (ref 0.2–1.2)
Total Protein: 7.7 g/dL (ref 6.1–8.1)
eGFR: 107 mL/min/1.73m2 (ref >=60)

## 2024-07-31 LAB — QUANTIFERON-TB GOLD PLUS
Mitogen-NIL: 2.42 [IU]/mL
NIL: 0.02 [IU]/mL
QuantiFERON-TB Gold Plus: NEGATIVE
TB1-NIL: 0 [IU]/mL
TB2-NIL: 0 [IU]/mL

## 2024-07-31 MED ORDER — CIMZIA (2 SYRINGE) 200 MG/ML ~~LOC~~ PSKT: 200.0000 mg | PREFILLED_SYRINGE | SUBCUTANEOUS | 0 refills | Status: DC

## 2024-07-31 NOTE — Progress Notes (Signed)
 TB gold negative

## 2024-07-31 NOTE — Progress Notes (Deleted)
 Office Visit Note  Patient: Meghan Arnold             Date of Birth: January 08, 1986           MRN: [id number]             PCP: Myrna Camelia HERO, NP Referring: Myrna Camelia HERO, NP Visit Date: 08/14/2024 Occupation: Data Unavailable  Subjective:  No chief complaint on file.   History of Present Illness: Meghan Arnold is a 38 y.o. female ***     Activities of Daily Living:  Patient reports morning stiffness for *** {minute/hour:19697}.   Patient {ACTIONS;DENIES/REPORTS:21021675::Denies} nocturnal pain.  Difficulty dressing/grooming: {ACTIONS;DENIES/REPORTS:21021675::Denies} Difficulty climbing stairs: {ACTIONS;DENIES/REPORTS:21021675::Denies} Difficulty getting out of chair: {ACTIONS;DENIES/REPORTS:21021675::Denies} Difficulty using hands for taps, buttons, cutlery, and/or writing: {ACTIONS;DENIES/REPORTS:21021675::Denies}  No Rheumatology ROS completed.   PMFS History:  Patient Active Problem List   Diagnosis Date Noted   Normal labor 04/02/2023   Encounter for induction of labor 11/05/2020   High risk medication use 12/03/2017   Raynaud's disease without gangrene 12/03/2017   Vitamin D  deficiency 12/03/2017   Infertility counseling 07/02/2017   Fertility testing 05/04/2017   Seropositive rheumatoid arthritis (HCC) 11/17/2016   Arthralgia of multiple joints 11/17/2016   CRP elevated 11/17/2016   Elevated erythrocyte sedimentation rate 11/17/2016   Raynaud's phenomenon without gangrene 11/17/2016    Past Medical History:  Diagnosis Date   Benign tumor of ovary 2013   Bunion of right foot    Chlamydia    Eczema    Family history of breast cancer    History of methicillin resistant staphylococcus aureus (MRSA) 2008   Hypertension    Raynaud's phenomenon without gangrene    mgmt rheum Dr French   Rheumatoid arthritis (HCC) 2017   Vitamin D  deficiency    Wears contact lenses     Family History  Problem Relation Age of Onset   Breast cancer  Maternal Grandmother 72   Colon cancer Maternal Grandfather    Healthy Son    Breast cancer Cousin 40   Breast cancer Other 26   Healthy Daughter    Past Surgical History:  Procedure Laterality Date   BUNIONECTOMY Right 12/24/2023   Procedure: MASSIE EDWARDS;  Surgeon: Janit Thresa HERO, DPM;  Location: [facility] ORS;  Service: Orthopedics/Podiatry;  Laterality: Right;   LAPAROSCOPIC SALPINGOOPHERECTOMY  2013   done by dr. johnston , believes right side laterality    LAPAROSCOPIC UNILATERAL SALPINGECTOMY Right 12/01/2017   Procedure: LAPAROSCOPIC LYSIS OF ADHESIONS, RIGHT TUBAL OVARIOLYSIS, RIGHT OVARIAN CYST ASPIRATION,CHROMOPERTUBATION, RIGHT FIMBRIOPLASTY;  Surgeon: Yalcinkaya, Tamer, MD;  Location: [facility];  Service: Gynecology;  Laterality: Right;   LAPAROSCOPY     OVARIAN CYST REMOVAL  08/2012   dr leonce at [facility] in [place] ; has abdominal insision    Social History   Tobacco Use   Smoking status: Never    Passive exposure: Never   Smokeless tobacco: Never   Tobacco comments:    highschool  Vaping Use   Vaping status: Never Used  Substance Use Topics   Alcohol use: Not Currently   Drug use: Not Currently   Social History   Social History Narrative   Not on file     Immunization History  Administered Date(s) Administered   MMR 01/09/2020   Tdap 08/11/2016, 09/16/2020     Objective: Vital Signs: There were no vitals taken for this visit.   Physical Exam   Musculoskeletal Exam: ***  CDAI Exam: CDAI Score: --  Patient Global: --; Provider Global: -- Swollen: --; Tender: -- Joint Exam 08/14/2024   No joint exam has been documented for this visit   There is currently no information documented on the homunculus. Go to the Rheumatology activity and complete the homunculus joint exam.  Investigation: No additional findings.  Imaging: No results found.  Recent Labs: Lab Results  Component Value Date   WBC 4.0 07/28/2024    HGB 12.3 07/28/2024   PLT 280 07/28/2024   NA 139 07/28/2024   K 4.1 07/28/2024   CL 105 07/28/2024   CO2 26 07/28/2024   GLUCOSE 79 07/28/2024   BUN 12 07/28/2024   CREATININE 0.73 07/28/2024   BILITOT 0.3 07/28/2024   ALKPHOS 130 (H) 04/02/2023   AST 11 07/28/2024   ALT 8 07/28/2024   PROT 7.7 07/28/2024   ALBUMIN 3.2 (L) 04/02/2023   CALCIUM 9.2 07/28/2024   GFRAA 125 03/11/2021   QFTBGOLDPLUS NEGATIVE 06/10/2023    Speciality Comments: Cimzia  06/19- restarted December 06, 2020  Procedures:  No procedures performed Allergies: Patient has no known allergies.   Assessment / Plan:     Visit Diagnoses: Rheumatoid arthritis involving multiple sites with positive rheumatoid factor (HCC)  High risk medication use  Bilateral bunions  Bilateral pes planus  Raynaud's phenomenon without gangrene  Eczema of both hands  Vitamin D  deficiency  Raynaud's disease without gangrene  Orders: No orders of the defined types were placed in this encounter.  No orders of the defined types were placed in this encounter.   Face-to-face time spent with patient was *** minutes. Greater than 50% of time was spent in counseling and coordination of care.  Follow-Up Instructions: No follow-ups on file.   Waddell CHRISTELLA Craze, PA-C  Note - This record has been created using Dragon software.  Chart creation errors have been sought, but may not always  have been located. Such creation errors do not reflect on  the standard of medical care.

## 2024-07-31 NOTE — Telephone Encounter (Signed)
 Last Fill: 03/28/2024  Labs: 07/28/2024 CBC and CMP are stable. Neutrophil's are low due to immunosuppression. We will continue to monitor labs every 3 months.   TB Gold: 06/10/2023   TB gold negative , 07/28/2024 pending  Next Visit: 08/14/2024  Last Visit: 03/08/2024  IK:Myzlfjunpi arthritis involving multiple sites with positive rheumatoid factor   Current Dose per office note 03/08/2024: Cimzia  200 mg subcutaneous injection every 2 weeks   Okay to refill Cimzia ?

## 2024-08-14 ENCOUNTER — Ambulatory Visit: Admitting: Physician Assistant

## 2024-08-14 DIAGNOSIS — L309 Dermatitis, unspecified: Secondary | ICD-10-CM

## 2024-08-14 DIAGNOSIS — M21611 Bunion of right foot: Secondary | ICD-10-CM

## 2024-08-14 DIAGNOSIS — I7300 Raynaud's syndrome without gangrene: Secondary | ICD-10-CM

## 2024-08-14 DIAGNOSIS — Z79899 Other long term (current) drug therapy: Secondary | ICD-10-CM

## 2024-08-14 DIAGNOSIS — E559 Vitamin D deficiency, unspecified: Secondary | ICD-10-CM

## 2024-08-14 DIAGNOSIS — M0579 Rheumatoid arthritis with rheumatoid factor of multiple sites without organ or systems involvement: Secondary | ICD-10-CM

## 2024-08-14 DIAGNOSIS — M2141 Flat foot [pes planus] (acquired), right foot: Secondary | ICD-10-CM
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
## 2024-08-28 ENCOUNTER — Ambulatory Visit: Attending: Physician Assistant | Admitting: Physician Assistant

## 2024-08-28 ENCOUNTER — Encounter: Payer: Self-pay | Admitting: Physician Assistant

## 2024-08-28 VITALS — BP 146/88 | HR 90 | Temp 98.4°F | Resp 14 | Ht 65.0 in | Wt 165.4 lb

## 2024-08-28 DIAGNOSIS — M2142 Flat foot [pes planus] (acquired), left foot: Secondary | ICD-10-CM | POA: Insufficient documentation

## 2024-08-28 DIAGNOSIS — M21612 Bunion of left foot: Secondary | ICD-10-CM | POA: Insufficient documentation

## 2024-08-28 DIAGNOSIS — E559 Vitamin D deficiency, unspecified: Secondary | ICD-10-CM | POA: Insufficient documentation

## 2024-08-28 DIAGNOSIS — M21611 Bunion of right foot: Secondary | ICD-10-CM | POA: Diagnosis not present

## 2024-08-28 DIAGNOSIS — Z79899 Other long term (current) drug therapy: Secondary | ICD-10-CM | POA: Insufficient documentation

## 2024-08-28 DIAGNOSIS — L309 Dermatitis, unspecified: Secondary | ICD-10-CM | POA: Insufficient documentation

## 2024-08-28 DIAGNOSIS — M0579 Rheumatoid arthritis with rheumatoid factor of multiple sites without organ or systems involvement: Secondary | ICD-10-CM | POA: Insufficient documentation

## 2024-08-28 DIAGNOSIS — M2141 Flat foot [pes planus] (acquired), right foot: Secondary | ICD-10-CM | POA: Insufficient documentation

## 2024-08-28 DIAGNOSIS — I7300 Raynaud's syndrome without gangrene: Secondary | ICD-10-CM | POA: Insufficient documentation

## 2024-08-28 NOTE — Patient Instructions (Signed)
 Standing Labs We placed an order today for your standing lab work. Please have your standing labs drawn at the end of January and every 3 months    Please have your labs drawn 2 weeks prior to your appointment so that the provider can discuss your lab results at your appointment, if possible.  Please note that you may see your imaging and lab results in MyChart before we have reviewed them. We will contact you once all results are reviewed. Please allow our office up to 72 hours to thoroughly review all of the results before contacting the office for clarification of your results.  WALK-IN LAB HOURS  Monday through Thursday from 8:00 am - 4:30 pm and Friday from 8:00 am-12:00 pm.  Patients with office visits requiring labs will be seen before walk-in labs.  You may encounter longer than normal wait times. Please allow additional time. Wait times may be shorter on  Monday and Thursday afternoons.  We do not book appointments for walk-in labs. We appreciate your patience and understanding with our staff.   Labs are drawn by [facility]. Please bring your co-pay at the time of your lab draw.  You may receive a bill from [facility] for your lab work.  Please note if you are on Hydroxychloroquine and and an order has been placed for a Hydroxychloroquine level,  you will need to have it drawn 4 hours or more after your last dose.  If you wish to have your labs drawn at another location, please call the office 24 hours in advance so we can fax the orders.  The office is located at [address], [place], [place] [address]   If you have any questions regarding directions or hours of operation,  please call [phone].   As a reminder, please drink plenty of water prior to coming for your lab work. Thanks!

## 2024-10-03 DIAGNOSIS — M0579 Rheumatoid arthritis with rheumatoid factor of multiple sites without organ or systems involvement: Secondary | ICD-10-CM

## 2024-10-03 DIAGNOSIS — Z79899 Other long term (current) drug therapy: Secondary | ICD-10-CM

## 2024-10-03 NOTE — Telephone Encounter (Signed)
 Last Fill: 07/31/2024   Labs: 07/28/2024 CBC and CMP are stable. Neutrophil's are low due to immunosuppression. We will continue to monitor labs every 3 months.   TB Gold: 07/28/2024 negative    Next Visit: 01/30/2025  Last Visit: 08/28/2024   IK:Myzlfjunpi arthritis involving multiple sites with positive rheumatoid factor   Current Dose per office note on 08/28/2024: Cimzia  200 mg subcutaneous injection every 2 weeks.   Okay to refill Cimzia ?

## 2024-11-02 ENCOUNTER — Encounter: Payer: Self-pay | Admitting: Dermatology

## 2024-11-02 ENCOUNTER — Ambulatory Visit: Admitting: Dermatology

## 2024-11-02 VITALS — BP 126/81

## 2024-11-02 DIAGNOSIS — L209 Atopic dermatitis, unspecified: Secondary | ICD-10-CM

## 2024-11-02 NOTE — Progress Notes (Signed)
" ° °  Follow-Up Visit   Subjective  Meghan Arnold is a 39 y.o. female established patient who presents for FOLLOW UP on the diagnoses listed below:  Patient was last evaluated on 06/13/24.   Atopic Dermatitis: Pt has been on Dupixent  therapy since 06/13/24 and presents today for progress follow up. Pt stated her hands have cleared. Today she rated her itch 1/10 when it was previously rated 9/10 on 06/13/24. She stated she get small flares during the week of her injection but she believes is weather related as it is colder outside. During the flares she has only had to use her Zorvye 3 times since starting Dupixent  therapy.    The following portions of the chart were reviewed this encounter and updated as appropriate: medications, allergies, medical history  Review of Systems:  No other skin or systemic complaints except as noted in HPI or Assessment and Plan.  Objective  Well appearing patient in no apparent distress; mood and affect are within normal limits.   A focused examination was performed of the following areas: B/L hands   Relevant exam findings are noted in the Assessment and Plan.                  Assessment & Plan   Atopic dermatitis of the hands Significant improvement in atopic dermatitis of the hands with Dupixent  treatment. Initial itch severity was 9/10, now reduced to 1/10. Mild, itchy patch on the dorsal aspect of the right hand. No injection site reactions reported. Dupixent  is well-tolerated and effective in controlling symptoms. Insurance requires documentation of efficacy for continued approval. IGA:1, BSA:4% (Hands), Itch 1/10  - Continue Dupixent  injections every two weeks. - Use Zoryve topical cream as needed. - Apply moisturizer and Vaseline before sleep to combat dry weather. - Use a humidifier to maintain skin moisture. - Sent prescription for Zoryve with eight refills to [facility]. - Ensure Dupixent  is stored properly and is good for up to two  weeks out of the refrigerator in case of power outage.    No follow-ups on file.   Documentation: I have reviewed the above documentation for accuracy and completeness, and I agree with the above.  I, Shirron Maranda, CMA II, am acting as scribe for:  Delon Lenis, DO "

## 2024-11-14 ENCOUNTER — Ambulatory Visit: Admitting: Dermatology

## 2025-01-30 ENCOUNTER — Ambulatory Visit: Admitting: Rheumatology
